# Patient Record
Sex: Female | Born: 1960 | Race: White | Hispanic: No | Marital: Married | Smoking: Former smoker
Health system: Southern US, Community
[De-identification: ages and names within clinical notes are randomized; demographics above are authoritative.]

## PROBLEM LIST (undated history)

## (undated) DIAGNOSIS — E119 Type 2 diabetes mellitus without complications: Secondary | ICD-10-CM

## (undated) DIAGNOSIS — I2510 Atherosclerotic heart disease of native coronary artery without angina pectoris: Secondary | ICD-10-CM

## (undated) DIAGNOSIS — F32A Depression, unspecified: Secondary | ICD-10-CM

## (undated) DIAGNOSIS — M5412 Radiculopathy, cervical region: Secondary | ICD-10-CM

## (undated) DIAGNOSIS — D649 Anemia, unspecified: Secondary | ICD-10-CM

## (undated) DIAGNOSIS — M51369 Other intervertebral disc degeneration, lumbar region without mention of lumbar back pain or lower extremity pain: Secondary | ICD-10-CM

## (undated) DIAGNOSIS — E041 Nontoxic single thyroid nodule: Secondary | ICD-10-CM

## (undated) DIAGNOSIS — I10 Essential (primary) hypertension: Secondary | ICD-10-CM

## (undated) DIAGNOSIS — F419 Anxiety disorder, unspecified: Secondary | ICD-10-CM

## (undated) DIAGNOSIS — J189 Pneumonia, unspecified organism: Secondary | ICD-10-CM

## (undated) DIAGNOSIS — Z79899 Other long term (current) drug therapy: Secondary | ICD-10-CM

## (undated) DIAGNOSIS — K219 Gastro-esophageal reflux disease without esophagitis: Secondary | ICD-10-CM

## (undated) DIAGNOSIS — M4802 Spinal stenosis, cervical region: Secondary | ICD-10-CM

## (undated) DIAGNOSIS — Z7902 Long term (current) use of antithrombotics/antiplatelets: Secondary | ICD-10-CM

## (undated) DIAGNOSIS — G43909 Migraine, unspecified, not intractable, without status migrainosus: Secondary | ICD-10-CM

## (undated) DIAGNOSIS — L409 Psoriasis, unspecified: Secondary | ICD-10-CM

## (undated) DIAGNOSIS — Z7960 Long term (current) use of unspecified immunomodulators and immunosuppressants: Secondary | ICD-10-CM

## (undated) DIAGNOSIS — I471 Supraventricular tachycardia, unspecified: Secondary | ICD-10-CM

## (undated) DIAGNOSIS — M1712 Unilateral primary osteoarthritis, left knee: Secondary | ICD-10-CM

## (undated) DIAGNOSIS — G608 Other hereditary and idiopathic neuropathies: Secondary | ICD-10-CM

## (undated) DIAGNOSIS — K224 Dyskinesia of esophagus: Secondary | ICD-10-CM

## (undated) DIAGNOSIS — G5601 Carpal tunnel syndrome, right upper limb: Secondary | ICD-10-CM

## (undated) DIAGNOSIS — J449 Chronic obstructive pulmonary disease, unspecified: Secondary | ICD-10-CM

## (undated) DIAGNOSIS — M1990 Unspecified osteoarthritis, unspecified site: Secondary | ICD-10-CM

## (undated) DIAGNOSIS — D509 Iron deficiency anemia, unspecified: Secondary | ICD-10-CM

## (undated) DIAGNOSIS — G8929 Other chronic pain: Secondary | ICD-10-CM

## (undated) DIAGNOSIS — L4050 Arthropathic psoriasis, unspecified: Secondary | ICD-10-CM

## (undated) DIAGNOSIS — R0600 Dyspnea, unspecified: Secondary | ICD-10-CM

## (undated) DIAGNOSIS — G4733 Obstructive sleep apnea (adult) (pediatric): Secondary | ICD-10-CM

## (undated) DIAGNOSIS — I779 Disorder of arteries and arterioles, unspecified: Secondary | ICD-10-CM

## (undated) DIAGNOSIS — E039 Hypothyroidism, unspecified: Secondary | ICD-10-CM

## (undated) DIAGNOSIS — E538 Deficiency of other specified B group vitamins: Secondary | ICD-10-CM

## (undated) DIAGNOSIS — I700 Atherosclerosis of aorta: Secondary | ICD-10-CM

## (undated) DIAGNOSIS — G4730 Sleep apnea, unspecified: Secondary | ICD-10-CM

## (undated) DIAGNOSIS — F329 Major depressive disorder, single episode, unspecified: Secondary | ICD-10-CM

## (undated) DIAGNOSIS — E7800 Pure hypercholesterolemia, unspecified: Secondary | ICD-10-CM

## (undated) HISTORY — PX: COLONOSCOPY WITH ESOPHAGOGASTRODUODENOSCOPY (EGD): SHX5779

## (undated) HISTORY — PX: TRIGGER FINGER RELEASE: SHX641

## (undated) HISTORY — PX: ABDOMINAL HYSTERECTOMY: SHX81

## (undated) HISTORY — PX: TONSILLECTOMY: SUR1361

## (undated) HISTORY — PX: OTHER SURGICAL HISTORY: SHX169

## (undated) HISTORY — PX: BUNIONECTOMY: SHX129

## (undated) HISTORY — PX: DILATION AND CURETTAGE OF UTERUS: SHX78

## (undated) HISTORY — PX: BACK SURGERY: SHX140

## (undated) HISTORY — PX: CYST EXCISION: SHX5701

---

## 2003-03-12 DIAGNOSIS — Z8614 Personal history of Methicillin resistant Staphylococcus aureus infection: Secondary | ICD-10-CM

## 2003-03-12 HISTORY — DX: Personal history of Methicillin resistant Staphylococcus aureus infection: Z86.14

## 2004-11-05 ENCOUNTER — Emergency Department: Payer: Self-pay | Admitting: Emergency Medicine

## 2004-11-23 ENCOUNTER — Emergency Department: Payer: Self-pay | Admitting: Emergency Medicine

## 2006-03-11 ENCOUNTER — Emergency Department: Payer: Self-pay

## 2007-01-30 ENCOUNTER — Ambulatory Visit: Payer: Self-pay | Admitting: Internal Medicine

## 2007-09-10 ENCOUNTER — Ambulatory Visit: Payer: Self-pay | Admitting: Anesthesiology

## 2007-10-15 ENCOUNTER — Ambulatory Visit: Payer: Self-pay | Admitting: Anesthesiology

## 2007-11-13 ENCOUNTER — Ambulatory Visit: Payer: Self-pay | Admitting: Anesthesiology

## 2007-12-15 ENCOUNTER — Ambulatory Visit: Payer: Self-pay | Admitting: Anesthesiology

## 2008-01-14 ENCOUNTER — Ambulatory Visit: Payer: Self-pay | Admitting: Anesthesiology

## 2008-03-15 ENCOUNTER — Ambulatory Visit: Payer: Self-pay | Admitting: Anesthesiology

## 2008-04-20 ENCOUNTER — Ambulatory Visit: Payer: Self-pay | Admitting: Anesthesiology

## 2008-05-06 ENCOUNTER — Ambulatory Visit: Payer: Self-pay | Admitting: Anesthesiology

## 2008-05-31 ENCOUNTER — Ambulatory Visit: Payer: Self-pay | Admitting: Anesthesiology

## 2008-07-11 ENCOUNTER — Ambulatory Visit: Payer: Self-pay | Admitting: Anesthesiology

## 2008-07-19 ENCOUNTER — Ambulatory Visit: Payer: Self-pay | Admitting: Anesthesiology

## 2008-08-25 ENCOUNTER — Ambulatory Visit: Payer: Self-pay | Admitting: Anesthesiology

## 2008-12-29 ENCOUNTER — Ambulatory Visit: Payer: Self-pay | Admitting: Anesthesiology

## 2009-04-06 ENCOUNTER — Ambulatory Visit: Payer: Self-pay | Admitting: Anesthesiology

## 2009-06-28 ENCOUNTER — Ambulatory Visit: Payer: Self-pay | Admitting: Anesthesiology

## 2009-08-31 ENCOUNTER — Ambulatory Visit: Payer: Self-pay | Admitting: Vascular Surgery

## 2009-08-31 ENCOUNTER — Inpatient Hospital Stay (HOSPITAL_COMMUNITY): Admission: RE | Admit: 2009-08-31 | Discharge: 2009-09-03 | Payer: Self-pay | Admitting: Neurosurgery

## 2010-05-27 LAB — BASIC METABOLIC PANEL
BUN: 10 mg/dL (ref 6–23)
Calcium: 10.1 mg/dL (ref 8.4–10.5)
Creatinine, Ser: 0.75 mg/dL (ref 0.4–1.2)
GFR calc non Af Amer: >60 mL/min (ref >60)
Glucose, Bld: 114 mg/dL — ABNORMAL HIGH (ref 70–99)

## 2010-05-27 LAB — CBC
MCHC: 35.7 g/dL (ref 30.0–36.0)
MCV: 96.4 fL (ref 78.0–100.0)
RBC: 4.36 MIL/uL (ref 3.87–5.11)
WBC: 7.8 10*3/uL (ref 4.0–10.5)

## 2010-05-27 LAB — TYPE AND SCREEN

## 2010-05-27 LAB — ABO/RH: ABO/RH(D): B POS

## 2010-05-27 LAB — SURGICAL PCR SCREEN: MRSA, PCR: NEGATIVE

## 2010-07-20 ENCOUNTER — Inpatient Hospital Stay: Payer: Self-pay | Admitting: Internal Medicine

## 2012-12-30 ENCOUNTER — Emergency Department: Payer: Self-pay | Admitting: Emergency Medicine

## 2013-04-05 ENCOUNTER — Emergency Department: Payer: Self-pay | Admitting: Emergency Medicine

## 2013-04-08 ENCOUNTER — Emergency Department: Payer: Self-pay | Admitting: Internal Medicine

## 2013-07-14 DIAGNOSIS — G43909 Migraine, unspecified, not intractable, without status migrainosus: Secondary | ICD-10-CM | POA: Insufficient documentation

## 2013-07-14 DIAGNOSIS — I10 Essential (primary) hypertension: Secondary | ICD-10-CM | POA: Insufficient documentation

## 2013-07-14 DIAGNOSIS — R1319 Other dysphagia: Secondary | ICD-10-CM | POA: Insufficient documentation

## 2013-07-14 DIAGNOSIS — D509 Iron deficiency anemia, unspecified: Secondary | ICD-10-CM | POA: Insufficient documentation

## 2013-08-16 ENCOUNTER — Ambulatory Visit: Payer: Self-pay | Admitting: Internal Medicine

## 2013-08-17 ENCOUNTER — Ambulatory Visit: Payer: Self-pay | Admitting: Internal Medicine

## 2013-08-23 ENCOUNTER — Ambulatory Visit: Payer: Self-pay | Admitting: Internal Medicine

## 2013-08-23 HISTORY — PX: BREAST BIOPSY: SHX20

## 2013-08-24 LAB — PATHOLOGY REPORT

## 2013-08-26 ENCOUNTER — Other Ambulatory Visit: Payer: Self-pay | Admitting: Neurosurgery

## 2013-08-26 ENCOUNTER — Other Ambulatory Visit (HOSPITAL_COMMUNITY): Payer: Self-pay | Admitting: Neurosurgery

## 2013-08-26 DIAGNOSIS — M5416 Radiculopathy, lumbar region: Secondary | ICD-10-CM

## 2013-09-17 ENCOUNTER — Ambulatory Visit: Payer: Self-pay | Admitting: Unknown Physician Specialty

## 2013-09-22 LAB — PATHOLOGY REPORT

## 2013-10-04 ENCOUNTER — Encounter (HOSPITAL_COMMUNITY): Payer: Self-pay | Admitting: Pharmacy Technician

## 2013-10-08 ENCOUNTER — Ambulatory Visit (HOSPITAL_COMMUNITY)
Admission: RE | Admit: 2013-10-08 | Discharge: 2013-10-08 | Disposition: A | Discharge status: Home / Self Care | Payer: Medicare Other | Source: Ambulatory Visit | Attending: Neurosurgery | Admitting: Neurosurgery

## 2013-10-08 ENCOUNTER — Other Ambulatory Visit (HOSPITAL_COMMUNITY): Payer: Self-pay | Admitting: Neurosurgery

## 2013-10-08 DIAGNOSIS — M5416 Radiculopathy, lumbar region: Secondary | ICD-10-CM

## 2013-10-08 DIAGNOSIS — IMO0002 Reserved for concepts with insufficient information to code with codable children: Secondary | ICD-10-CM | POA: Diagnosis not present

## 2013-10-08 MED ORDER — IOHEXOL 180 MG/ML  SOLN: 20.0000 mL | Freq: Once | INTRAMUSCULAR | Status: DC | PRN

## 2013-10-08 MED ORDER — DIAZEPAM 5 MG PO TABS
ORAL_TABLET | ORAL | Status: DC
Start: 2013-10-08 — End: 2013-10-09
  Filled 2013-10-08: qty 2

## 2013-10-08 MED ORDER — OXYCODONE-ACETAMINOPHEN 5-325 MG PO TABS
ORAL_TABLET | ORAL | Status: AC
  Filled 2013-10-08: qty 2

## 2013-10-08 MED ORDER — DIAZEPAM 5 MG PO TABS
10.0000 mg | ORAL_TABLET | Freq: Once | ORAL | Status: AC
  Administered 2013-10-08: 10 mg via ORAL

## 2013-10-08 MED ORDER — ONDANSETRON HCL 4 MG/2ML IJ SOLN: 4.0000 mg | Freq: Four times a day (QID) | INTRAMUSCULAR | Status: DC | PRN

## 2013-10-08 MED ORDER — IOHEXOL 300 MG/ML  SOLN
10.0000 mL | Freq: Once | INTRAMUSCULAR | Status: AC | PRN
  Administered 2013-10-08: 10 mL via INTRATHECAL

## 2013-10-08 MED ORDER — OXYCODONE-ACETAMINOPHEN 10-325 MG PO TABS: 1.0000 | ORAL_TABLET | ORAL | Status: DC | PRN

## 2013-10-08 MED ORDER — OXYCODONE-ACETAMINOPHEN 5-325 MG PO TABS
2.0000 | ORAL_TABLET | Freq: Once | ORAL | Status: AC
  Administered 2013-10-08: 2 via ORAL
  Filled 2013-10-08: qty 2

## 2013-10-08 NOTE — Discharge Instructions (Signed)
Myelography, Care After °These instructions give you information on caring for yourself after your procedure. Your doctor may also give you specific instructions. Call your doctor if you have any problems or questions after your procedure. °HOME CARE °· Rest often the first day. °· When you rest, lie flat, with your head slightly raised (elevated). °· Avoid heavy lifting and activity for 48 hours. °· You may take the bandage (dressing) off 1 day after the test. °GET HELP RIGHT AWAY IF:  °· You have a very bad headache. °· You have a fever. °MAKE SURE YOU: °· Understand these instructions. °· Will watch your condition. °· Will get help right away if you are not doing well or get worse. °Document Released: 12/05/2007 Document Revised: 07/12/2013 Document Reviewed: 06/16/2013 °[employer]® Patient Information ©2015 [employer]. This information is not intended to replace advice given to you by your health care provider. Make sure you discuss any questions you have with your health care provider. ° ° °

## 2013-10-08 NOTE — Procedures (Signed)
No note

## 2014-04-08 ENCOUNTER — Ambulatory Visit: Payer: Self-pay | Admitting: Internal Medicine

## 2014-08-17 DIAGNOSIS — R739 Hyperglycemia, unspecified: Secondary | ICD-10-CM | POA: Insufficient documentation

## 2014-08-22 IMAGING — CT CT CERVICAL SPINE W/ CM
5 of 7 series · 14 of 33 positions shown, 15 images · non-contrast
Comparison: Lumbar spine radiograph 08/18/2013 and lumbar spine MRI
06/08/2009. No prior cervical spine imaging available.

ADDENDUM:
No bridging bone is identified across the L4-5 disc space,
compatible with postoperative pseudoarthrosis. Pseudoarthrosis is
also questioned at L5-S1.
CLINICAL DATA: Low back pain with bilateral hip and leg pain.
Numbness extending to the feet. Bilateral arm numbness.
TECHNIQUE: Contiguous axial images were obtained through the Cervical and
Lumbar spine after the intrathecal infusion of contrast. Coronal and
sagittal reconstructions were obtained of the axial image sets.

[Series 202: orthogonal bone, idose (2) · axial · 0.35mm/px · z∈[+75,+119]mm · 2 of 70 slices shown]
[im 24/70  bone]
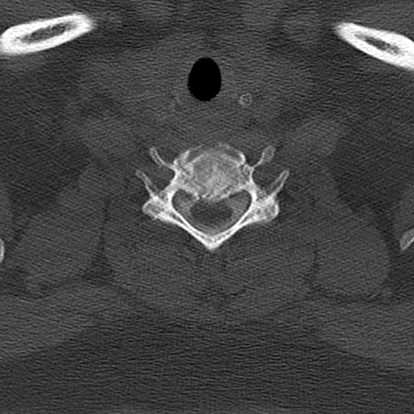
[im 47/70  bone]
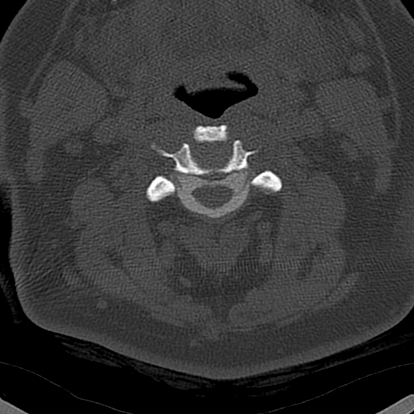

[Series 203: soft tissue, idose (2) · axial · 0.28mm/px · z∈[+86,+154]mm · 3 of 70 slices shown]
[im 18/70  soft-tissue]
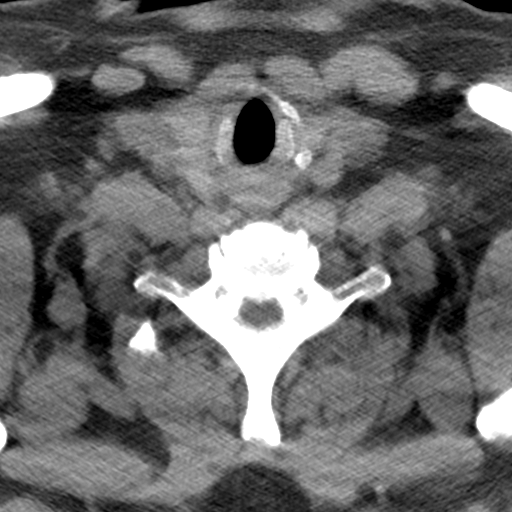
[im 35/70  soft-tissue]
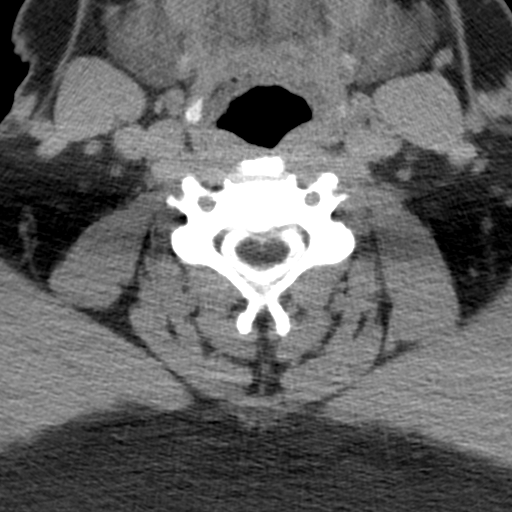
[im 52/70  soft-tissue]
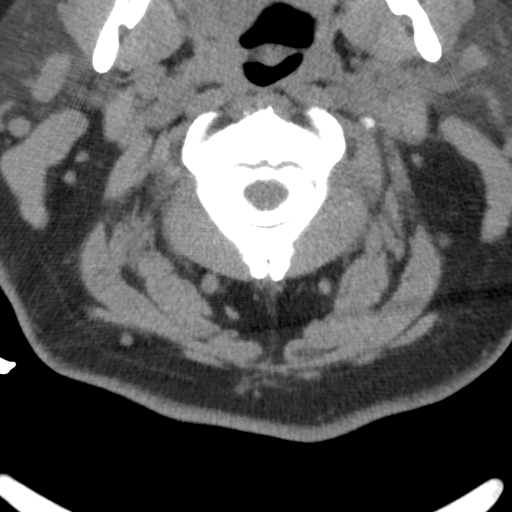

[Series 205: coronal bone, idose (2) · coronal · 0.35mm/px · 1 of 60 slices shown]
[im 30/60  bone]
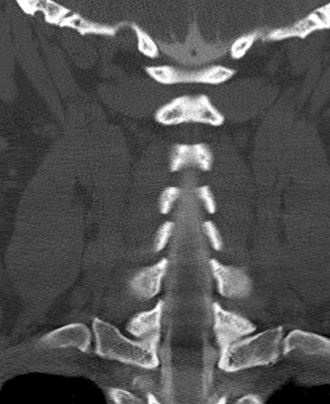

[Series 206: sagittal st, idose (2) · sagittal · 0.35mm/px · 5 of 57 slices shown]
[im 10/57  bone]
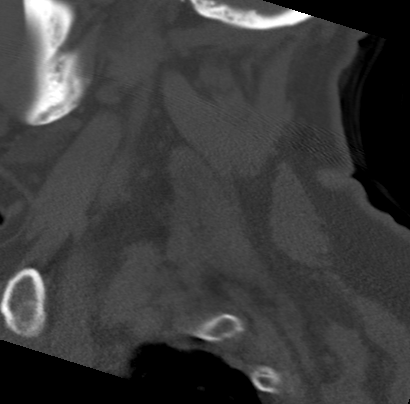
[im 19/57  bone]
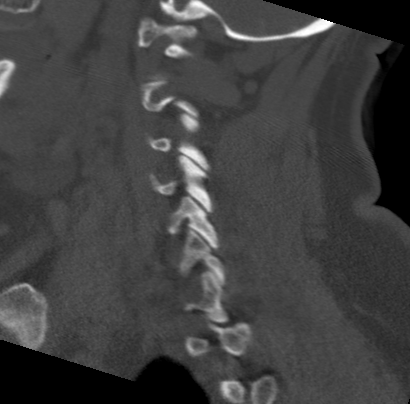
[im 29/57  bone]
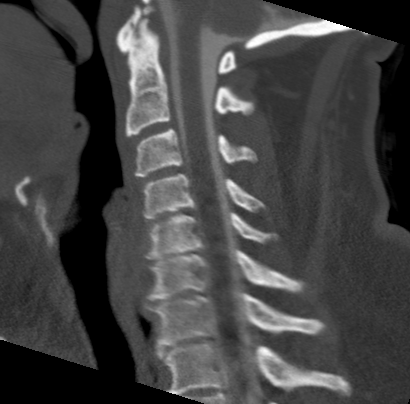
[im 38/57  bone]
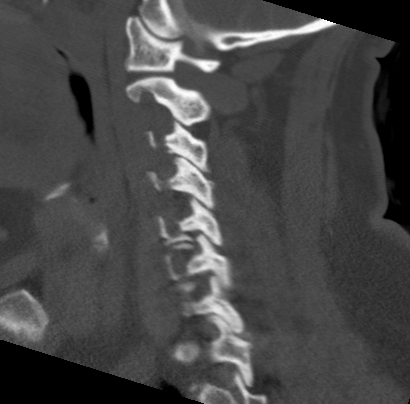
[im 47/57  bone]
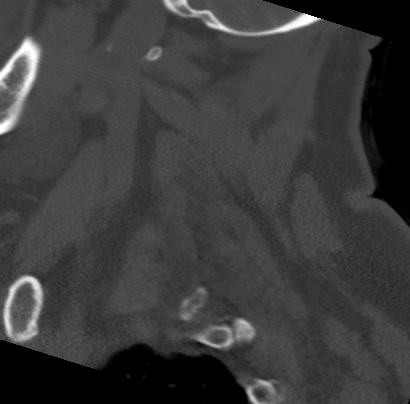

[Series 208: orthogonals, idose (2) · axial · 0.28mm/px · z∈[+86,+154]mm · 3 of 70 slices shown, 4 images]
[im 18/70  soft-tissue]
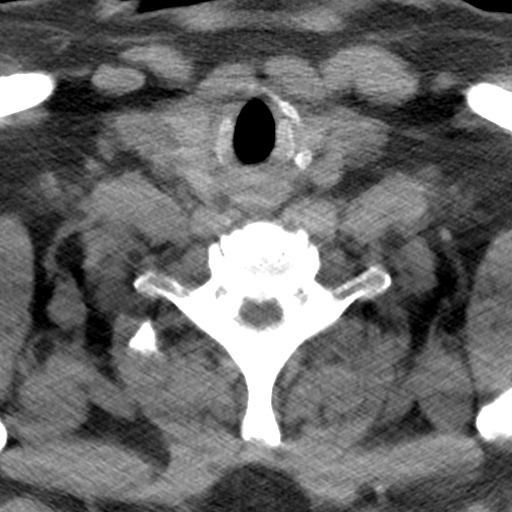
[im 18/70  bone]
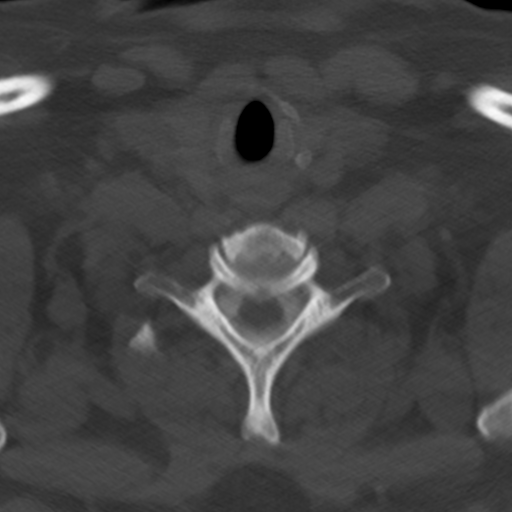
[im 35/70  bone]
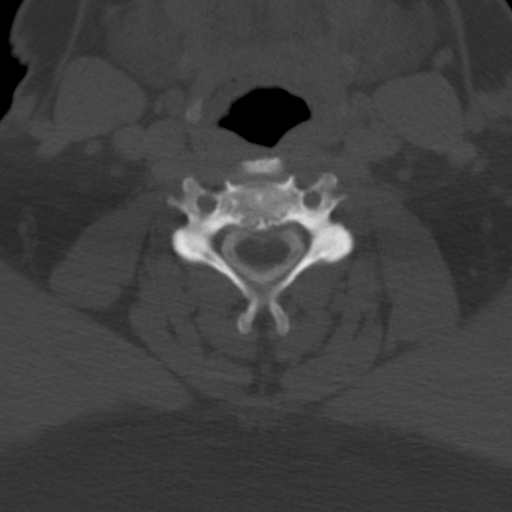
[im 52/70  bone]
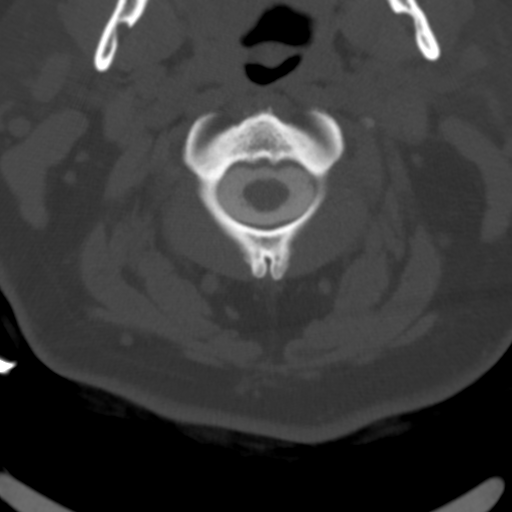

[14 of 33 positions shown; findings below may reference images not displayed]

FLUOROSCOPY TIME:  1 min 8 seconds

PROCEDURE:
CERVICAL AND LUMBAR MYELOGRAM

CT CERVICAL MYELOGRAM

CT LUMBAR MYELOGRAM

Lumbar puncture and intrathecal contrast administration were
performed by Dr. Remilyn who will separately report for the portion of
the procedure. I personally supervised acquisition of the myelogram
images.
FINDINGS: CERVICAL AND LUMBAR MYELOGRAM FINDINGS:

Sequelae of anterior lumbar interbody fusion are identified at L4-5
and L5-S1. A small ventral epidural defect is present at L3-4.
Lumbar vertebral alignment is normal. Cervical vertebral alignment
is normal. Multilevel anterior and posterior endplate spurring is
seen in the cervical spine, most prominently at C5-6.

CT CERVICAL MYELOGRAM FINDINGS:

Images are mildly degraded by motion. There is straightening of the
normal cervical lordosis. No significant listhesis is seen. There is
mild-to-moderate disc space narrowing from C4-5 to C7-T1, most
pronounced at C5-6. Degenerative endplate changes are present at
each of these levels. Cervical spinal cord is normal in caliber.
There is mild asymmetric enlargement of the right thyroid lobe with
question of a 1.4 cm nodule posteriorly, although evaluation is
limited by motion and image noise at this level.

C2-3:  Shallow central disc protrusion without stenosis.

C3-4: Small central disc osteophyte complex, uncovertebral
arthrosis, and mild left facet arthrosis result in mild left neural
foraminal stenosis. No spinal canal stenosis.

C4-5: Small central disc protrusion and uncovertebral arthrosis
without stenosis.

C5-6: Broad-based posterior disc osteophyte complex and prominent
uncovertebral arthrosis result in moderate to severe bilateral
neural foraminal stenosis and borderline spinal stenosis.

C6-7: Shallow, broad base posterior disc osteophyte complex and
uncovertebral arthrosis without stenosis.

C7-T1:  Mild uncovertebral spurring without stenosis.

CT LUMBAR MYELOGRAM FINDINGS:

Vertebral alignment is near anatomic, with at most trace
retrolisthesis of L2 on L3 and L3 on L4. Sequelae of ALIF are
identified L4-5 and L5-S1, new from prior MRI. No bridging bone is
identified traversing the L4-5 disc space. There may be minimal
bridging bone across the L5-S1 disc space. Vertebral body heights
are preserved without compression fracture. Mild disc space
narrowing and vacuum disc phenomenon are noted at L1-2 with
prominent anterior endplate osteophyte formation. The conus
medullaris terminates at T12-L1. Mild aortoiliac atherosclerotic
calcification is noted.

L1-2:  Minimal disc bulge without stenosis, unchanged.

L2-3:  Negative.

L3-4: Mild disc bulge and mild facet and ligamentum flavum
hypertrophy result in mild left greater than right neural foraminal
stenosis without spinal canal stenosis, unchanged.

L4-5: ALIF. There is no residual spinal canal stenosis. Mild disc
bulging and endplate spurring in the neural foramina and mild to
moderate facet arthrosis result in mild bilateral neural foraminal
stenosis, stable to slightly improved from prior.

L5-S1: ALIF. No residual disc protrusion or spinal stenosis. Mild
endplate spurring without neural foraminal stenosis.
IMPRESSION: 1. Moderate cervical spondylosis, greatest at C5-6 where there is
moderate to severe bilateral neural foraminal stenosis and
borderline spinal stenosis.
2. Prior ALIF at L4-5 and L5-S1 with widely patent spinal canal.
3. Mild bilateral neural foraminal stenosis at L3-4 and L4-5.

## 2014-08-22 IMAGING — CT CT L SPINE W/ CM
3 of 9 series · 15 of 33 positions shown, 17 images · non-contrast
Comparison: Lumbar spine radiograph 08/18/2013 and lumbar spine MRI
06/08/2009. No prior cervical spine imaging available.

ADDENDUM:
No bridging bone is identified across the L4-5 disc space,
compatible with postoperative pseudoarthrosis. Pseudoarthrosis is
also questioned at L5-S1.
CLINICAL DATA: Low back pain with bilateral hip and leg pain.
Numbness extending to the feet. Bilateral arm numbness.
TECHNIQUE: Contiguous axial images were obtained through the Cervical and
Lumbar spine after the intrathecal infusion of contrast. Coronal and
sagittal reconstructions were obtained of the axial image sets.

[Series 402: soft tissue, idose (2) · axial · 0.38mm/px · z∈[-352,-184]mm · 8 of 109 slices shown, 10 images]
[im 13/109  soft-tissue]
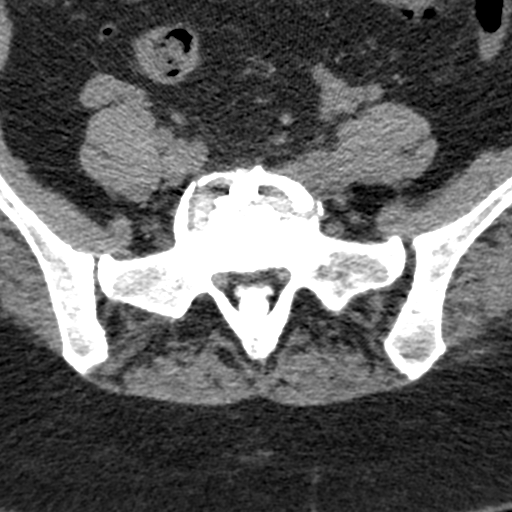
[im 13/109  bone]
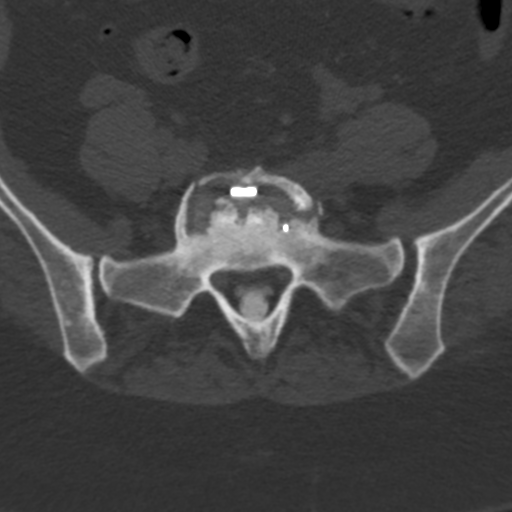
[im 25/109  bone]
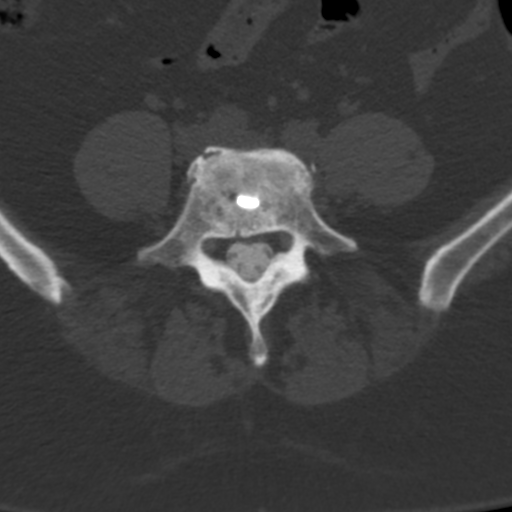
[im 37/109  bone]
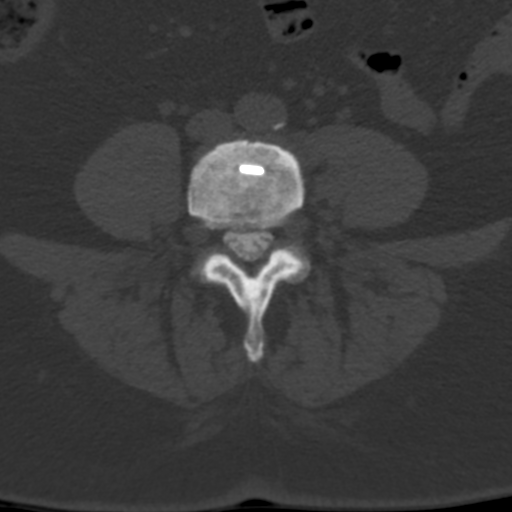
[im 49/109  bone]
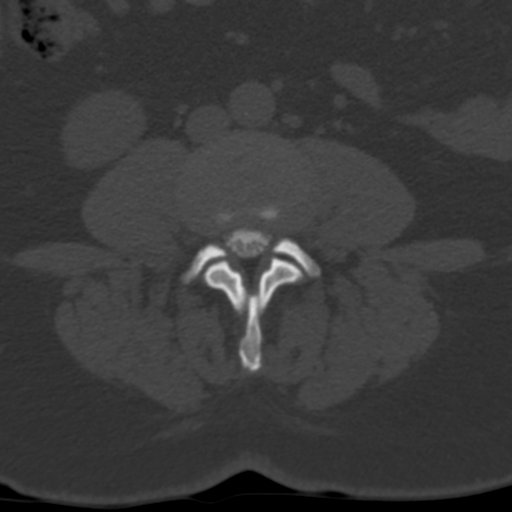
[im 61/109  soft-tissue]
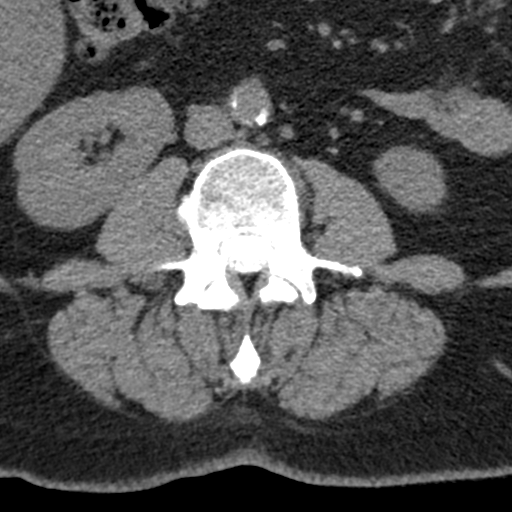
[im 61/109  bone]
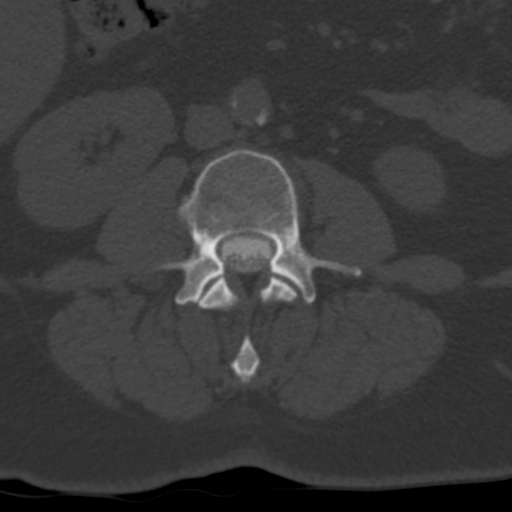
[im 73/109  bone]
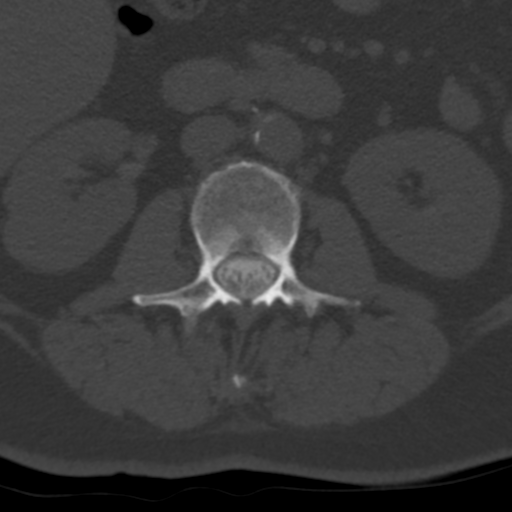
[im 85/109  bone]
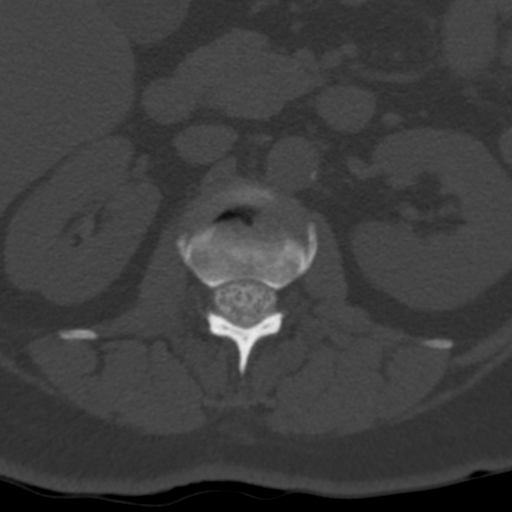
[im 97/109  bone]
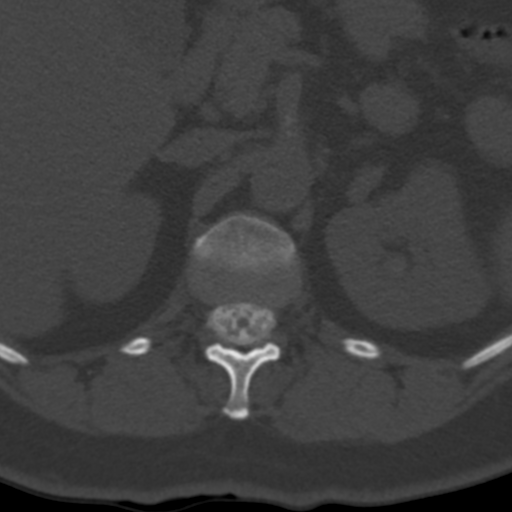

[Series 404: sagittal bone, idose (2) · sagittal · 0.36mm/px · 5 of 64 slices shown]
[im 11/64  bone]
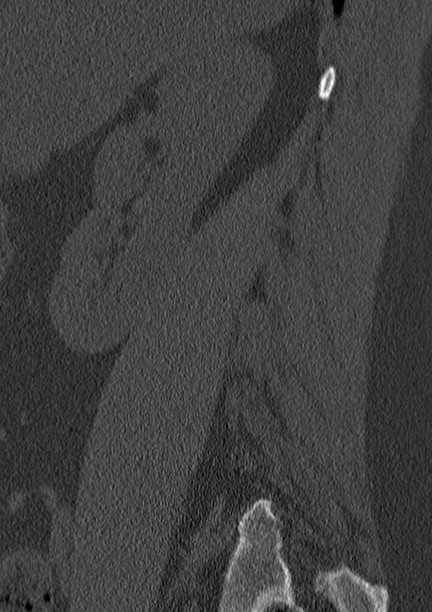
[im 22/64  bone]
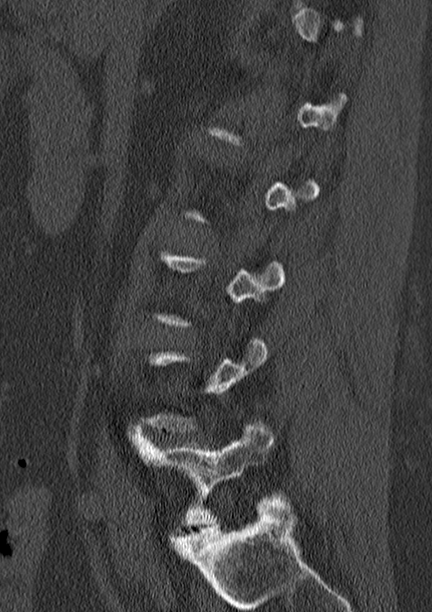
[im 32/64  bone]
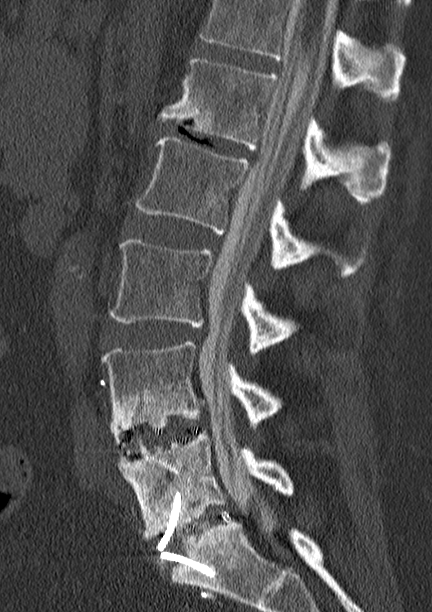
[im 43/64  bone]
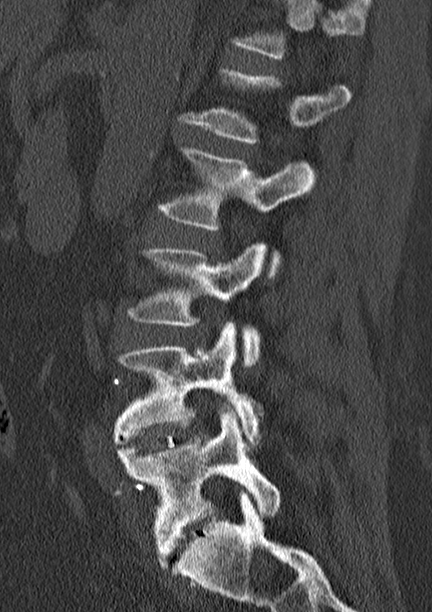
[im 53/64  bone]
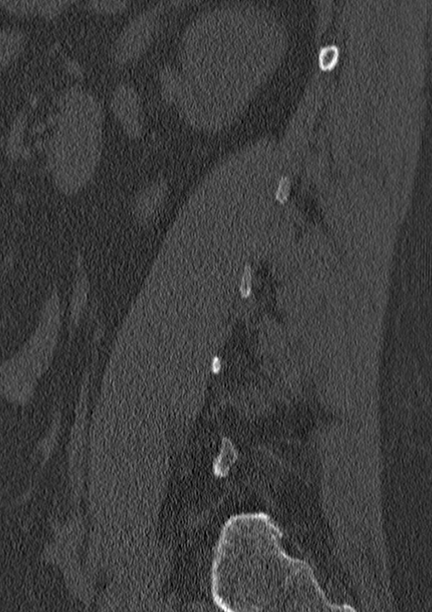

[Series 405: coronal st, idose (2) · coronal · 0.39mm/px · 2 of 79 slices shown]
[im 27/79  bone]
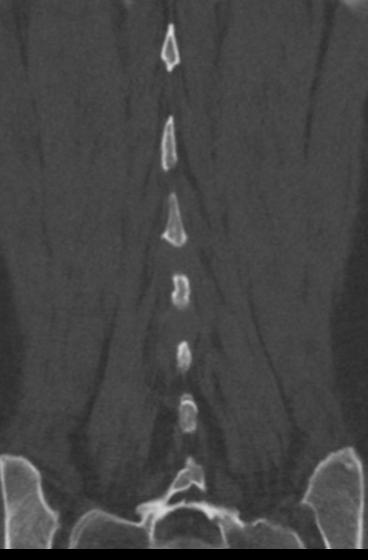
[im 53/79  bone]
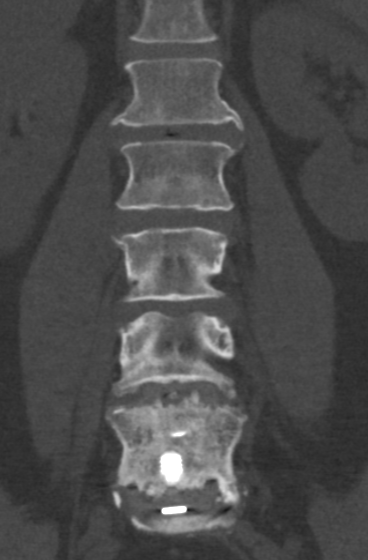

[15 of 33 positions shown; findings below may reference images not displayed]

FLUOROSCOPY TIME:  1 min 8 seconds

PROCEDURE:
CERVICAL AND LUMBAR MYELOGRAM

CT CERVICAL MYELOGRAM

CT LUMBAR MYELOGRAM

Lumbar puncture and intrathecal contrast administration were
performed by Dr. Remilyn who will separately report for the portion of
the procedure. I personally supervised acquisition of the myelogram
images.
FINDINGS: CERVICAL AND LUMBAR MYELOGRAM FINDINGS:

Sequelae of anterior lumbar interbody fusion are identified at L4-5
and L5-S1. A small ventral epidural defect is present at L3-4.
Lumbar vertebral alignment is normal. Cervical vertebral alignment
is normal. Multilevel anterior and posterior endplate spurring is
seen in the cervical spine, most prominently at C5-6.

CT CERVICAL MYELOGRAM FINDINGS:

Images are mildly degraded by motion. There is straightening of the
normal cervical lordosis. No significant listhesis is seen. There is
mild-to-moderate disc space narrowing from C4-5 to C7-T1, most
pronounced at C5-6. Degenerative endplate changes are present at
each of these levels. Cervical spinal cord is normal in caliber.
There is mild asymmetric enlargement of the right thyroid lobe with
question of a 1.4 cm nodule posteriorly, although evaluation is
limited by motion and image noise at this level.

C2-3:  Shallow central disc protrusion without stenosis.

C3-4: Small central disc osteophyte complex, uncovertebral
arthrosis, and mild left facet arthrosis result in mild left neural
foraminal stenosis. No spinal canal stenosis.

C4-5: Small central disc protrusion and uncovertebral arthrosis
without stenosis.

C5-6: Broad-based posterior disc osteophyte complex and prominent
uncovertebral arthrosis result in moderate to severe bilateral
neural foraminal stenosis and borderline spinal stenosis.

C6-7: Shallow, broad base posterior disc osteophyte complex and
uncovertebral arthrosis without stenosis.

C7-T1:  Mild uncovertebral spurring without stenosis.

CT LUMBAR MYELOGRAM FINDINGS:

Vertebral alignment is near anatomic, with at most trace
retrolisthesis of L2 on L3 and L3 on L4. Sequelae of ALIF are
identified L4-5 and L5-S1, new from prior MRI. No bridging bone is
identified traversing the L4-5 disc space. There may be minimal
bridging bone across the L5-S1 disc space. Vertebral body heights
are preserved without compression fracture. Mild disc space
narrowing and vacuum disc phenomenon are noted at L1-2 with
prominent anterior endplate osteophyte formation. The conus
medullaris terminates at T12-L1. Mild aortoiliac atherosclerotic
calcification is noted.

L1-2:  Minimal disc bulge without stenosis, unchanged.

L2-3:  Negative.

L3-4: Mild disc bulge and mild facet and ligamentum flavum
hypertrophy result in mild left greater than right neural foraminal
stenosis without spinal canal stenosis, unchanged.

L4-5: ALIF. There is no residual spinal canal stenosis. Mild disc
bulging and endplate spurring in the neural foramina and mild to
moderate facet arthrosis result in mild bilateral neural foraminal
stenosis, stable to slightly improved from prior.

L5-S1: ALIF. No residual disc protrusion or spinal stenosis. Mild
endplate spurring without neural foraminal stenosis.
IMPRESSION: 1. Moderate cervical spondylosis, greatest at C5-6 where there is
moderate to severe bilateral neural foraminal stenosis and
borderline spinal stenosis.
2. Prior ALIF at L4-5 and L5-S1 with widely patent spinal canal.
3. Mild bilateral neural foraminal stenosis at L3-4 and L4-5.

## 2014-08-24 ENCOUNTER — Other Ambulatory Visit: Payer: Self-pay | Admitting: Internal Medicine

## 2014-08-24 DIAGNOSIS — R928 Other abnormal and inconclusive findings on diagnostic imaging of breast: Secondary | ICD-10-CM

## 2014-08-31 ENCOUNTER — Ambulatory Visit: Payer: Medicare Other

## 2014-08-31 ENCOUNTER — Ambulatory Visit
Admission: RE | Admit: 2014-08-31 | Discharge: 2014-08-31 | Disposition: A | Discharge status: Home / Self Care | Payer: Medicare Other | Source: Ambulatory Visit | Attending: Internal Medicine | Admitting: Internal Medicine

## 2014-08-31 DIAGNOSIS — R928 Other abnormal and inconclusive findings on diagnostic imaging of breast: Secondary | ICD-10-CM | POA: Diagnosis present

## 2014-11-22 ENCOUNTER — Other Ambulatory Visit: Payer: Self-pay | Admitting: Internal Medicine

## 2014-11-22 DIAGNOSIS — R1310 Dysphagia, unspecified: Secondary | ICD-10-CM

## 2014-12-02 ENCOUNTER — Ambulatory Visit
Admission: RE | Admit: 2014-12-02 | Discharge: 2014-12-02 | Disposition: A | Discharge status: Home / Self Care | Payer: Medicare Other | Source: Ambulatory Visit | Attending: Internal Medicine | Admitting: Internal Medicine

## 2014-12-02 DIAGNOSIS — R1310 Dysphagia, unspecified: Secondary | ICD-10-CM | POA: Diagnosis present

## 2014-12-02 DIAGNOSIS — K219 Gastro-esophageal reflux disease without esophagitis: Secondary | ICD-10-CM | POA: Insufficient documentation

## 2015-09-11 ENCOUNTER — Emergency Department
Admission: EM | Admit: 2015-09-11 | Discharge: 2015-09-11 | Disposition: A | Discharge status: Home / Self Care | Payer: Medicare Other | Attending: Emergency Medicine | Admitting: Emergency Medicine

## 2015-09-11 ENCOUNTER — Encounter: Payer: Self-pay | Admitting: Emergency Medicine

## 2015-09-11 DIAGNOSIS — F17200 Nicotine dependence, unspecified, uncomplicated: Secondary | ICD-10-CM | POA: Insufficient documentation

## 2015-09-11 DIAGNOSIS — F329 Major depressive disorder, single episode, unspecified: Secondary | ICD-10-CM | POA: Insufficient documentation

## 2015-09-11 DIAGNOSIS — Y929 Unspecified place or not applicable: Secondary | ICD-10-CM | POA: Diagnosis not present

## 2015-09-11 DIAGNOSIS — Y999 Unspecified external cause status: Secondary | ICD-10-CM | POA: Insufficient documentation

## 2015-09-11 DIAGNOSIS — W268XXA Contact with other sharp object(s), not elsewhere classified, initial encounter: Secondary | ICD-10-CM | POA: Diagnosis not present

## 2015-09-11 DIAGNOSIS — Y9389 Activity, other specified: Secondary | ICD-10-CM | POA: Insufficient documentation

## 2015-09-11 DIAGNOSIS — S61011A Laceration without foreign body of right thumb without damage to nail, initial encounter: Secondary | ICD-10-CM | POA: Diagnosis present

## 2015-09-11 DIAGNOSIS — I10 Essential (primary) hypertension: Secondary | ICD-10-CM | POA: Diagnosis not present

## 2015-09-11 HISTORY — DX: Depression, unspecified: F32.A

## 2015-09-11 HISTORY — DX: Pure hypercholesterolemia, unspecified: E78.00

## 2015-09-11 HISTORY — DX: Major depressive disorder, single episode, unspecified: F32.9

## 2015-09-11 HISTORY — DX: Essential (primary) hypertension: I10

## 2015-09-11 MED ORDER — LIDOCAINE HCL (PF) 1 % IJ SOLN
5.0000 mL | Freq: Once | INTRAMUSCULAR | Status: AC
  Administered 2015-09-11: 5 mL
  Filled 2015-09-11: qty 5

## 2015-09-11 NOTE — ED Provider Notes (Signed)
[facility] Emergency Department Provider Note  ____________________________________________  Time seen: Approximately 10:15 AM  I have reviewed the triage vital signs and the nursing notes.   HISTORY  Chief Complaint Laceration    HPI Taylor [name] is a 55 y.o. female, NAD, presents to the emergency department with 1 day history of laceration to the right thumb. States she was using a mandolin to cut corn yesterday around 2pm when she cut her thumb. Has not been able to control the bleeding. Has wrapped the thumb in sanitary pads and duct tape.  Pain is described as sharp and throbbing and rated at 9/10. Has taken Tylenol without relief. States that she went through eight wash cloths when the trauma occurred but the cloths were not saturated. Denies numbness nor paraesthesias. Has had no decreased range of motion of the thumb or other fingers. Tetanus vaccine is up-to-date within the last 5 years.   Past Medical History  Diagnosis Date  . High cholesterol   . Hypertension   . Depression     There are no active problems to display for this patient.   Past Surgical History  Procedure Laterality Date  . Breast biopsy Left 08-23-2013    stereo, negative    Current Outpatient Rx  Name  Route  Sig  Dispense  Refill  . oxyCODONE-acetaminophen (PERCOCET) 10-325 MG per tablet   Oral   Take 1 tablet by mouth every 4 (four) hours as needed for pain.   30 tablet   0   . pravastatin (PRAVACHOL) 40 MG tablet   Oral   Take 40 mg by mouth at bedtime.         . traMADol (ULTRAM) 50 MG tablet   Oral   Take 50 mg by mouth every 6 (six) hours as needed for moderate pain.           Allergies Aspirin and Penicillins  Family History  Problem Relation Age of Onset  . Breast cancer Mother 52    Social History Social History  Substance Use Topics  . Smoking status: Current Every Day Smoker  . Smokeless tobacco: None  . Alcohol Use: Yes     Review  of Systems  Constitutional: No fever/chills, fatigue Cardiovascular: No chest pain, palpitations. Respiratory: No shortness of breath. No wheezing.  Gastrointestinal: No abdominal pain.  No nausea, vomiting.   Musculoskeletal: Positive right thumb pain at laceration site. Negative for right hand, wrist pain.  Skin: Positive for laceration to right thumb pad. Negative for rash, redness, swelling.  Neurological: Negative for headaches, focal weakness. No paraesthesias nor numbness. 10-point ROS otherwise negative.  ____________________________________________   PHYSICAL EXAM:  VITAL SIGNS: ED Triage Vitals  Enc Vitals Group     BP --      Pulse --      Resp --      Temp --      Temp src --      SpO2 --      Weight --      Height --      Head Cir --      Peak Flow --      Pain Score 09/11/15 1003 9     Pain Loc --      Pain Edu? --      Excl. in [place]? --      Constitutional: Alert and oriented. Well appearing and in no acute distress. Eyes: Conjunctivae are normal.  Head: Atraumatic. Cardiovascular:  Good  peripheral circulation with 2+ pulses noted in the right upper extremity. Capillary refill is brisk in all digits of the right hand Respiratory: Normal respiratory effort without tachypnea or retractions. Musculoskeletal: Full ROM and 5/5 strength in R thumb. No tenderness to palpation about the joints of the right thumb. Neurologic:  Normal speech and language. No gross focal neurologic deficits are appreciated. Sensation to light touch grossly intact about all digits of the right hand Skin:  Skin is warm and dry. No rash, redness, swelling, bruising noted. 0.7cm annular soft tissue defect laceration to the right thumb pad, weeping and clotting blood. Psychiatric: Mood and affect are normal. Speech and behavior are normal. Patient exhibits appropriate insight and judgement.   ____________________________________________    LABS  None ____________________________________________  EKG  None ____________________________________________  RADIOLOGY  None ____________________________________________    PROCEDURES  Procedure(s) performed: LACERATION REPAIR Performed by: Braxton Feathers Authorized by: Braxton Feathers Consent: Verbal consent obtained. Risks and benefits: risks, benefits and alternatives were discussed Consent given by: patient Patient identity confirmed: provided demographic data Wound explored  Laceration Location: Distal right thumb  Laceration Length: 0.7cm annular soft tissue defect  No Foreign Bodies seen or palpated  Anesthesia: Digital block  Local anesthetic: lidocaine 1% w/o epinephrine  Anesthetic total: 3 ml  Irrigation method: syringe Amount of cleaning: standard  Technique: The wound was thoroughly irrigated and soaked in a saline and iodine mixture. Surgicel was applied to the open wound to gain appropriate hemostasis. Wound was then covered with nonstick bandage and gauze.  Patient tolerance: Patient tolerated the procedure well with no immediate complications.      Medications  lidocaine (PF) (XYLOCAINE) 1 % injection 5 mL (5 mLs Infiltration Given 09/11/15 1130)     ____________________________________________   INITIAL IMPRESSION / ASSESSMENT AND PLAN / ED COURSE  Patient's diagnosis is consistent with Laceration of right thumb without foreign body without damage to nail. Patient will be discharged home with instructions for home care to include daily bandage changes. Patient may take over-the-counter Tylenol or ibuprofen as needed for pain. Patient is to follow up with her primary care provider or [facility] in 48 hours for wound recheck. Patient is given ED precautions to return to the ED for any worsening or new symptoms.    ____________________________________________  FINAL CLINICAL IMPRESSION(S) / ED DIAGNOSES  Final diagnoses:   Laceration of right thumb without foreign body without damage to nail, initial encounter      NEW MEDICATIONS STARTED DURING THIS VISIT:  Discharge Medication List as of 09/11/2015 11:21 AM           Braxton Feathers, PA-C 09/11/15 [place], MD 09/13/15 1517

## 2015-09-11 NOTE — Discharge Instructions (Signed)
Wound Care Taking care of your wound properly can help to prevent pain and infection. It can also help your wound to heal more quickly.  HOW TO CARE FOR YOUR WOUND  Take or apply over-the-counter and prescription medicines only as told by your health care provider.  If you were prescribed antibiotic medicine, take or apply it as told by your health care provider. Do not stop using the antibiotic even if your condition improves.  Clean the wound each day or as told by your health care provider.  Wash the wound with mild soap and water.  Rinse the wound with water to remove all soap.  Pat the wound dry with a clean towel. Do not rub it.  There are many different ways to close and cover a wound. For example, a wound can be covered with stitches (sutures), skin glue, or adhesive strips. Follow instructions from your health care provider about:  How to take care of your wound.  When and how you should change your bandage (dressing).  When you should remove your dressing.  Removing whatever was used to close your wound.  Check your wound every day for signs of infection. Watch for:  Redness, swelling, or pain.  Fluid, blood, or pus.  Keep the dressing dry until your health care provider says it can be removed. Do not take baths, swim, use a hot tub, or do anything that would put your wound underwater until your health care provider approves.  Raise (elevate) the injured area above the level of your heart while you are sitting or lying down.  Do not scratch or pick at the wound.  Keep all follow-up visits as told by your health care provider. This is important. SEEK MEDICAL CARE IF:  You received a tetanus shot and you have swelling, severe pain, redness, or bleeding at the injection site.  You have a fever.  Your pain is not controlled with medicine.  You have increased redness, swelling, or pain at the site of your wound.  You have fluid, blood, or pus coming from your  wound.  You notice a bad smell coming from your wound or your dressing. SEEK IMMEDIATE MEDICAL CARE IF:  You have a red streak going away from your wound.   This information is not intended to replace advice given to you by your health care provider. Make sure you discuss any questions you have with your health care provider.   Document Released: 12/05/2007 Document Revised: 07/12/2014 Document Reviewed: 02/21/2014 Elsevier Interactive Patient Education 2016 [employer]  Nonsutured Laceration Care A laceration is a cut that goes through all layers of the skin and extends into the tissue that is right under the skin. This type of cut is usually stitched up (sutured) or closed with tape (adhesive strips) or skin glue shortly after the injury happens. However, if the wound is dirty or if several hours pass before medical treatment is provided, it is likely that germs (bacteria) will enter the wound. Closing a laceration after bacteria have entered it increases the risk of infection. In these cases, your health care provider may leave the laceration open (nonsutured) and cover it with a bandage. This type of treatment helps prevent infection and allows the wound to heal from the deepest layer of tissue damage up to the surface. An open fracture is a type of injury that may involve nonsutured lacerations. An open fracture is a break in a bone that happens along with one or more lacerations through  the skin that is near the fracture site. HOW TO CARE FOR YOUR NONSUTURED LACERATION  Take or apply over-the-counter and prescription medicines only as told by your health care provider.  If you were prescribed an antibiotic medicine, take or apply it as told by your health care provider. Do not stop using the antibiotic even if your condition improves.  Clean the wound one time each day or as told by your health care provider.  Wash the wound with mild soap and water.  Rinse the wound with water to  remove all soap.  Pat your wound dry with a clean towel. Do not rub the wound.  Do not inject anything into the wound unless your health care provider told you to.  Change any bandages (dressings) as told by your health care provider. This includes changing the dressing if it gets wet, dirty, or starts to smell bad.  Keep the dressing dry until your health care provider says it can be removed. Do not take baths, swim, or do anything that puts your wound underwater until your health care provider approves.  Raise (elevate) the injured area above the level of your heart while you are sitting or lying down, if possible.  Do not scratch or pick at the wound.  Check your wound every day for signs of infection. Watch for:  Redness, swelling, or pain.  Fluid, blood, or pus.  Keep all follow-up visits as told by your health care provider. This is important. SEEK MEDICAL CARE IF:  You received a tetanus and shot and you have swelling, severe pain, redness, or bleeding at the injection site.   You have a fever.  Your pain is not controlled with medicine.  You have increased redness, swelling, or pain at the site of your wound.  You have fluid, blood, or pus coming from your wound.  You notice a bad smell coming from your wound or your dressing.  You notice something coming out of the wound, such as wood or glass.  You notice a change in the color of your skin near your wound.  You develop a new rash.  You need to change the dressing frequently due to fluid, blood, or pus draining from the wound.  You develop numbness around your wound. SEEK IMMEDIATE MEDICAL CARE IF:  Your pain suddenly increases and is severe.  You develop severe swelling around the wound.  The wound is on your hand or foot and you cannot properly move a finger or toe.  The wound is on your hand or foot and you notice that your fingers or toes look pale or bluish.  You have a red streak going away from  your wound.   This information is not intended to replace advice given to you by your health care provider. Make sure you discuss any questions you have with your health care provider.   Document Released: 01/23/2006 Document Revised: 07/12/2014 Document Reviewed: 02/21/2014 Elsevier Interactive Patient Education [employer].

## 2015-09-11 NOTE — ED Notes (Signed)
See triage note   States she lacerated her right thumb yesterday  States she cleaned it and placed a dressing  But area conts to bleed

## 2015-09-11 NOTE — ED Notes (Signed)
Pt to ed with c/o laceration to right hand first digit.  States she cut it on a mandolin yesterday while cutting corn off the cob.  Pt states she can not get it to stop bleeding.

## 2016-01-01 DIAGNOSIS — L400 Psoriasis vulgaris: Secondary | ICD-10-CM | POA: Insufficient documentation

## 2016-01-01 DIAGNOSIS — L4050 Arthropathic psoriasis, unspecified: Secondary | ICD-10-CM | POA: Insufficient documentation

## 2016-02-08 DIAGNOSIS — Z79899 Other long term (current) drug therapy: Secondary | ICD-10-CM | POA: Insufficient documentation

## 2016-07-04 ENCOUNTER — Other Ambulatory Visit: Payer: Self-pay | Admitting: Internal Medicine

## 2016-07-04 DIAGNOSIS — Z1231 Encounter for screening mammogram for malignant neoplasm of breast: Secondary | ICD-10-CM

## 2016-07-30 ENCOUNTER — Ambulatory Visit
Admission: RE | Admit: 2016-07-30 | Discharge: 2016-07-30 | Disposition: A | Discharge status: Home / Self Care | Payer: Medicare Other | Source: Ambulatory Visit | Attending: Internal Medicine | Admitting: Internal Medicine

## 2016-07-30 DIAGNOSIS — Z1231 Encounter for screening mammogram for malignant neoplasm of breast: Secondary | ICD-10-CM | POA: Diagnosis not present

## 2017-01-07 DIAGNOSIS — M5136 Other intervertebral disc degeneration, lumbar region: Secondary | ICD-10-CM | POA: Insufficient documentation

## 2017-01-13 DIAGNOSIS — I471 Supraventricular tachycardia: Secondary | ICD-10-CM | POA: Insufficient documentation

## 2017-05-13 DIAGNOSIS — M25572 Pain in left ankle and joints of left foot: Secondary | ICD-10-CM | POA: Insufficient documentation

## 2017-05-13 DIAGNOSIS — G8929 Other chronic pain: Secondary | ICD-10-CM | POA: Insufficient documentation

## 2017-07-18 ENCOUNTER — Other Ambulatory Visit: Payer: Self-pay | Admitting: Internal Medicine

## 2017-07-18 DIAGNOSIS — Z1231 Encounter for screening mammogram for malignant neoplasm of breast: Secondary | ICD-10-CM

## 2017-08-08 ENCOUNTER — Ambulatory Visit
Admission: RE | Admit: 2017-08-08 | Discharge: 2017-08-08 | Disposition: A | Discharge status: Home / Self Care | Payer: Medicare Other | Source: Ambulatory Visit | Attending: Internal Medicine | Admitting: Internal Medicine

## 2017-08-08 DIAGNOSIS — Z1231 Encounter for screening mammogram for malignant neoplasm of breast: Secondary | ICD-10-CM | POA: Insufficient documentation

## 2017-08-13 ENCOUNTER — Other Ambulatory Visit: Payer: Self-pay | Admitting: Internal Medicine

## 2017-08-13 DIAGNOSIS — M25561 Pain in right knee: Secondary | ICD-10-CM | POA: Insufficient documentation

## 2017-08-13 DIAGNOSIS — N644 Mastodynia: Secondary | ICD-10-CM

## 2017-08-22 ENCOUNTER — Ambulatory Visit
Admission: RE | Admit: 2017-08-22 | Discharge: 2017-08-22 | Disposition: A | Discharge status: Home / Self Care | Payer: Medicare Other | Source: Ambulatory Visit | Attending: Internal Medicine | Admitting: Internal Medicine

## 2017-08-22 DIAGNOSIS — N644 Mastodynia: Secondary | ICD-10-CM | POA: Diagnosis not present

## 2017-12-11 DIAGNOSIS — R252 Cramp and spasm: Secondary | ICD-10-CM | POA: Insufficient documentation

## 2017-12-11 DIAGNOSIS — M1711 Unilateral primary osteoarthritis, right knee: Secondary | ICD-10-CM | POA: Insufficient documentation

## 2018-04-07 DIAGNOSIS — E538 Deficiency of other specified B group vitamins: Secondary | ICD-10-CM | POA: Insufficient documentation

## 2018-06-29 ENCOUNTER — Encounter: Admission: RE | Payer: Self-pay | Source: Home / Self Care

## 2018-06-29 ENCOUNTER — Ambulatory Visit
Admission: RE | Admit: 2018-06-29 | Payer: Medicare Other | Source: Home / Self Care | Admitting: Unknown Physician Specialty

## 2018-06-29 SURGERY — ESOPHAGOGASTRODUODENOSCOPY (EGD) WITH PROPOFOL
Anesthesia: General

## 2018-07-23 ENCOUNTER — Other Ambulatory Visit: Payer: Self-pay | Admitting: Internal Medicine

## 2018-07-23 DIAGNOSIS — Z1231 Encounter for screening mammogram for malignant neoplasm of breast: Secondary | ICD-10-CM

## 2018-07-24 ENCOUNTER — Other Ambulatory Visit (HOSPITAL_COMMUNITY): Payer: Self-pay | Admitting: Internal Medicine

## 2018-07-24 ENCOUNTER — Other Ambulatory Visit: Payer: Self-pay | Admitting: Internal Medicine

## 2018-07-24 DIAGNOSIS — R1012 Left upper quadrant pain: Secondary | ICD-10-CM

## 2018-07-31 ENCOUNTER — Other Ambulatory Visit: Payer: Self-pay

## 2018-07-31 ENCOUNTER — Ambulatory Visit
Admission: RE | Admit: 2018-07-31 | Discharge: 2018-07-31 | Disposition: A | Discharge status: Home / Self Care | Payer: Medicare Other | Source: Ambulatory Visit | Attending: Internal Medicine | Admitting: Internal Medicine

## 2018-07-31 DIAGNOSIS — R1012 Left upper quadrant pain: Secondary | ICD-10-CM | POA: Diagnosis not present

## 2018-08-26 ENCOUNTER — Ambulatory Visit
Admission: RE | Admit: 2018-08-26 | Discharge: 2018-08-26 | Disposition: A | Discharge status: Home / Self Care | Payer: Medicare Other | Source: Ambulatory Visit | Attending: Internal Medicine | Admitting: Internal Medicine

## 2018-08-26 ENCOUNTER — Other Ambulatory Visit: Payer: Self-pay

## 2018-08-26 DIAGNOSIS — Z1231 Encounter for screening mammogram for malignant neoplasm of breast: Secondary | ICD-10-CM | POA: Diagnosis present

## 2018-08-31 DIAGNOSIS — K2950 Unspecified chronic gastritis without bleeding: Secondary | ICD-10-CM | POA: Insufficient documentation

## 2019-01-26 DIAGNOSIS — E118 Type 2 diabetes mellitus with unspecified complications: Secondary | ICD-10-CM | POA: Insufficient documentation

## 2019-06-10 DEATH — deceased

## 2019-06-14 IMAGING — US ULTRASOUND ABDOMEN COMPLETE
1 series · 13 of 25 positions shown · non-contrast
Comparison: None.

CLINICAL DATA: Left upper quadrant abdominal pain for 6 months.

EXAM:
ABDOMEN ULTRASOUND COMPLETE

[Series 1: ultrasound abdomen complete · 0.26mm/px · 13 of 140 slices shown]
[im 1/140]
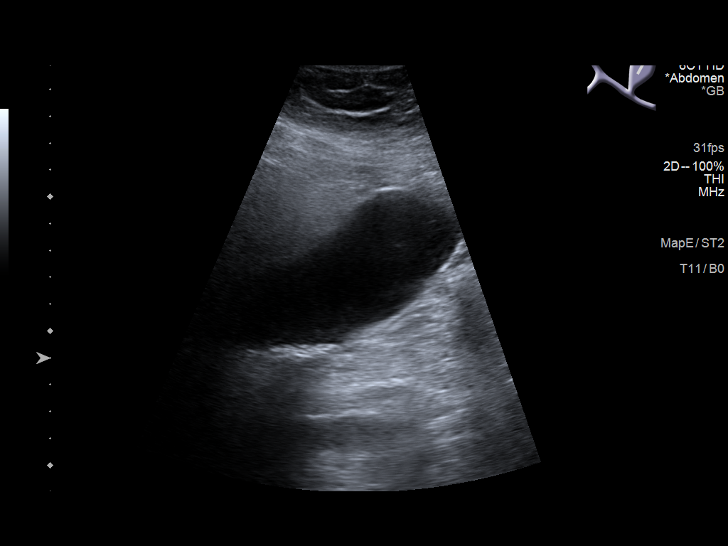
[im 12/140]
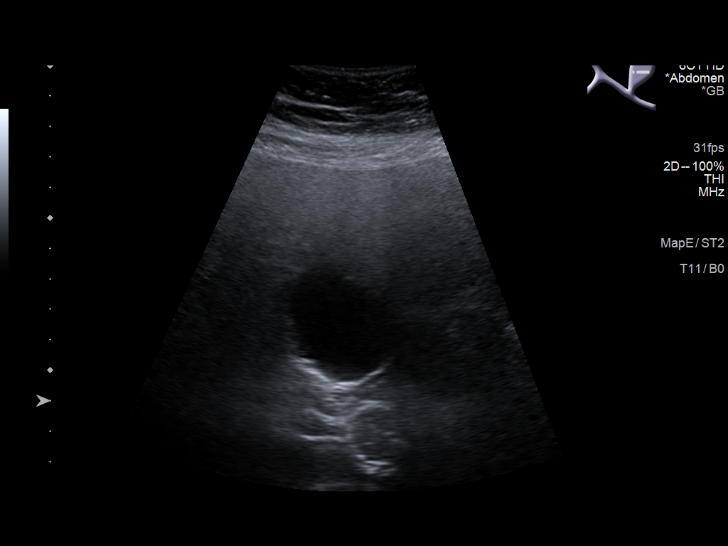
[im 24/140]
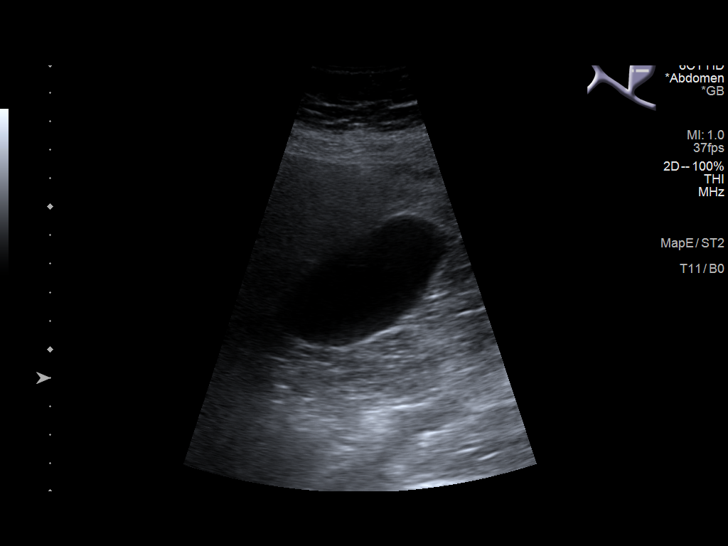
[im 35/140]
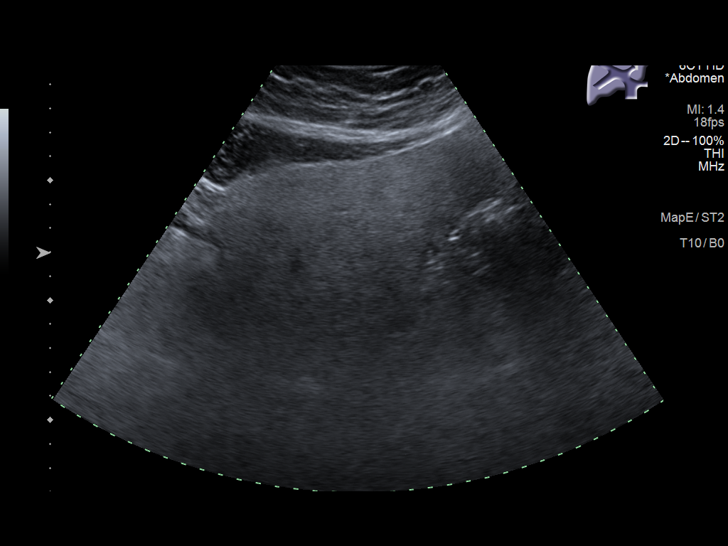
[im 47/140]
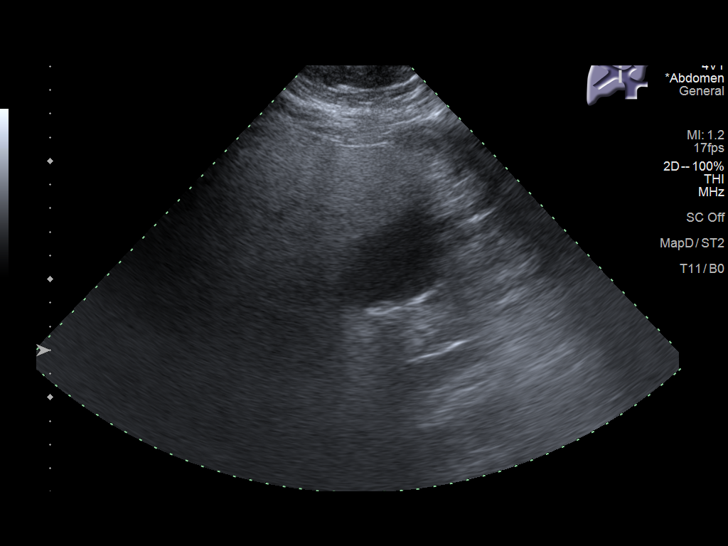
[im 58/140]
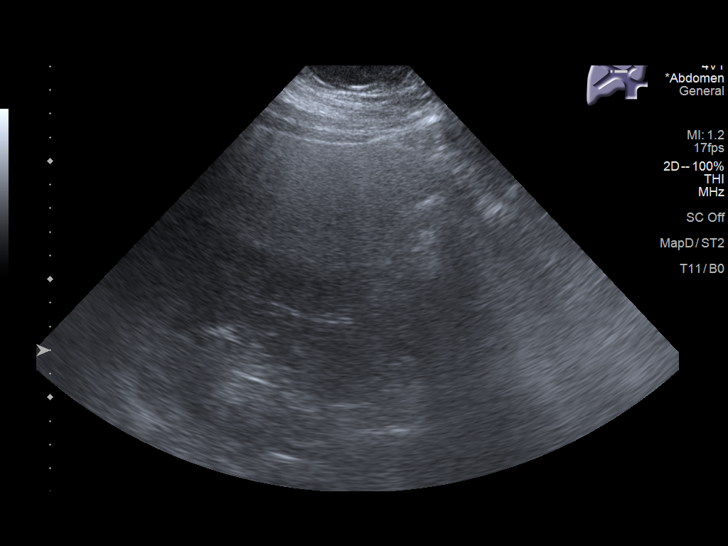
[im 70/140]
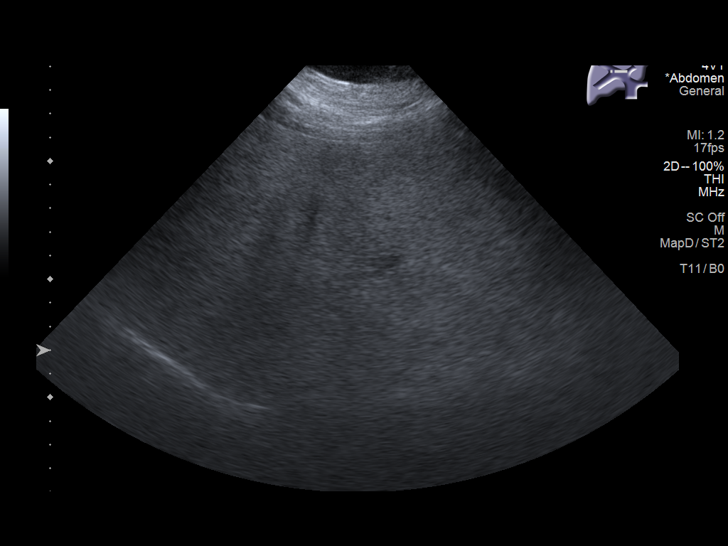
[im 82/140]
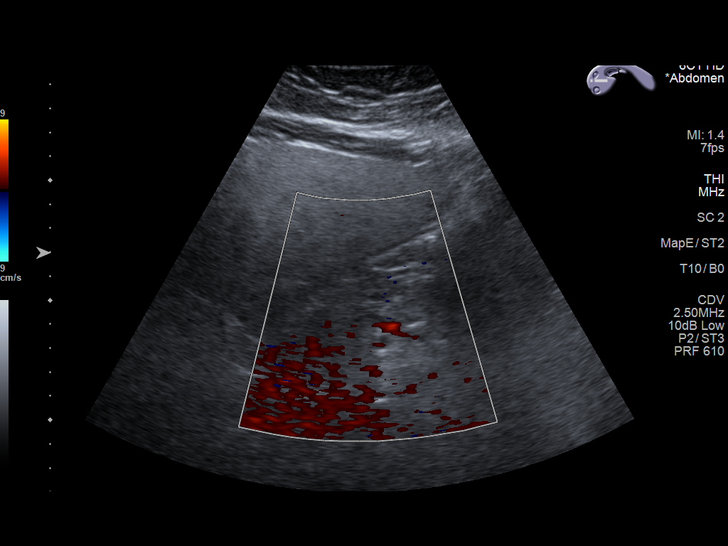
[im 93/140]
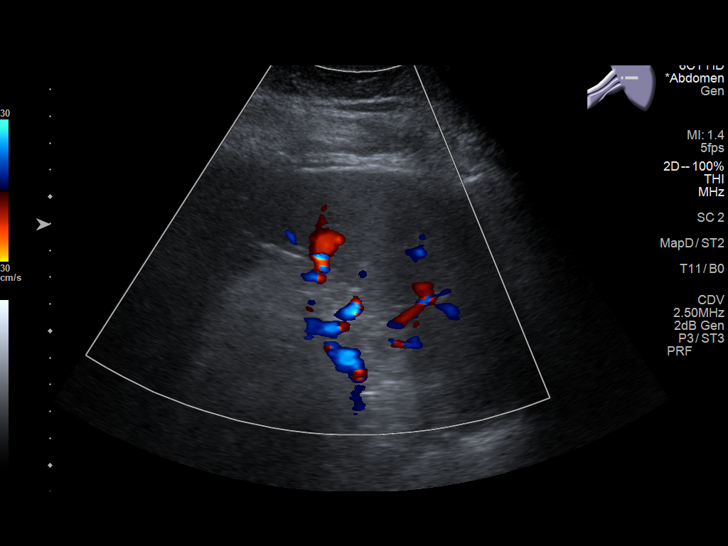
[im 105/140]
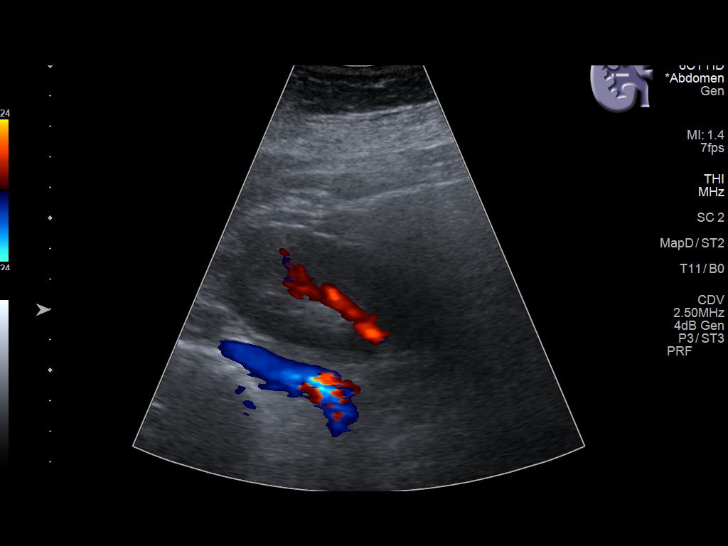
[im 116/140]
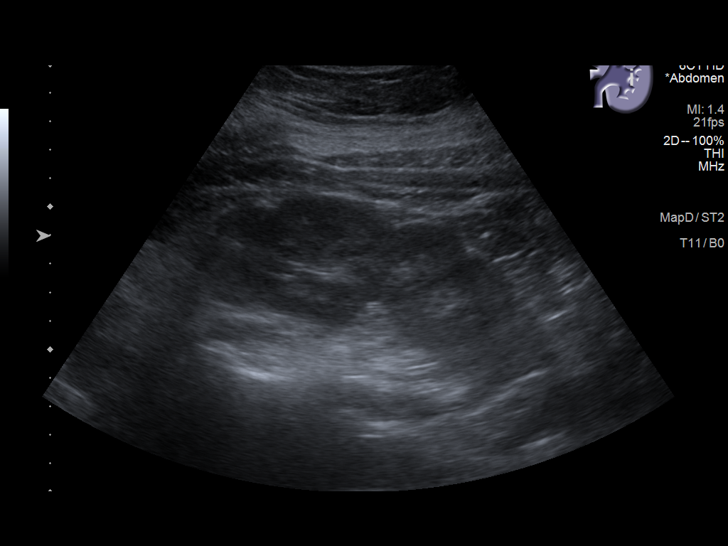
[im 128/140]
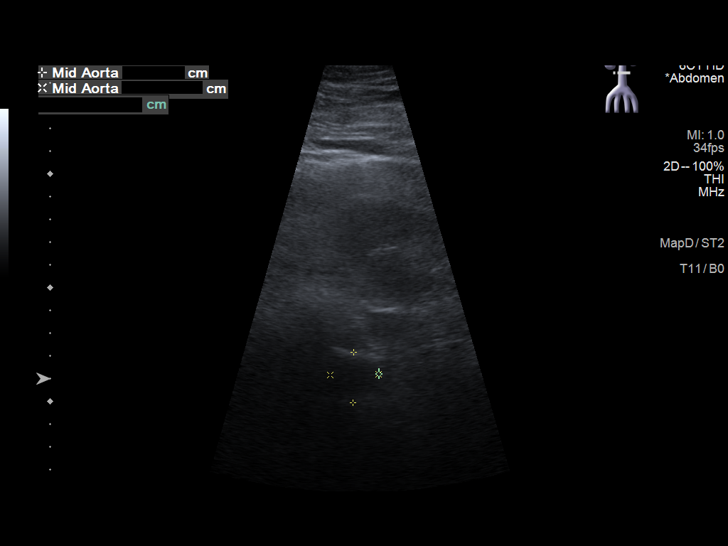
[im 140/140]
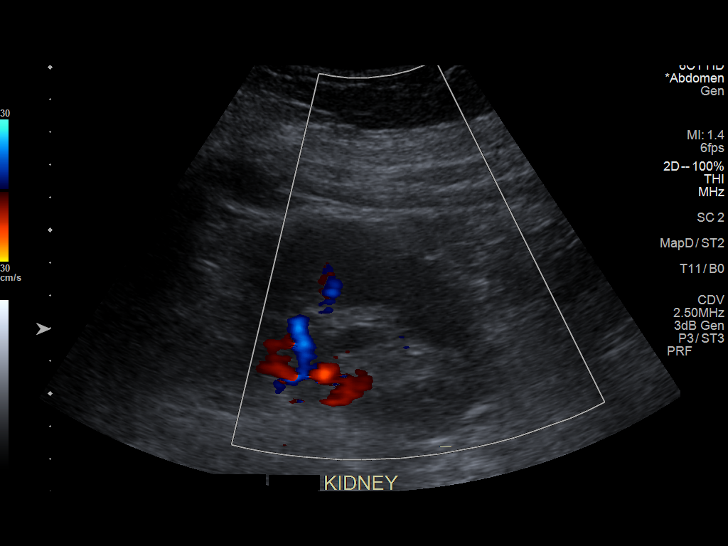

[13 of 25 positions shown; findings below may reference images not displayed]

FINDINGS: Gallbladder: Cholelithiasis is noted without gallbladder wall
thickening or pericholecystic fluid. No sonographic Murphy's sign is
noted.

Common bile duct: Diameter: 5 mm which is within normal limits.

Liver: No focal lesion identified. Increased echogenicity of hepatic
parenchyma is noted suggesting hepatic steatosis. Portal vein is
patent on color Doppler imaging with normal direction of blood flow
towards the liver.

IVC: No abnormality visualized.

Pancreas: Not visualized due to overlying bowel gas.

Spleen: Size and appearance within normal limits.

Right Kidney: Length: 12.6 cm. Echogenicity within normal limits. No
mass or hydronephrosis visualized.

Left Kidney: Length: 12.4 cm. 1.9 cm simple cyst is noted.
Echogenicity within normal limits. No mass or hydronephrosis
visualized.

Abdominal aorta: No aneurysm visualized.

Other findings: None.
IMPRESSION: Cholelithiasis without inflammation.

Increased echogenicity of hepatic parenchyma is noted suggesting
hepatic steatosis.

Pancreas not visualized due to overlying bowel gas.

## 2019-06-26 ENCOUNTER — Other Ambulatory Visit: Payer: Self-pay

## 2019-06-26 ENCOUNTER — Emergency Department: Payer: Medicare Other

## 2019-06-26 ENCOUNTER — Encounter: Payer: Self-pay | Admitting: Emergency Medicine

## 2019-06-26 ENCOUNTER — Inpatient Hospital Stay
Admission: EM | Admit: 2019-06-26 | Discharge: 2019-06-29 | DRG: 189 | Disposition: A | Discharge status: Home / Self Care | Payer: Medicare Other | Attending: Internal Medicine | Admitting: Internal Medicine

## 2019-06-26 DIAGNOSIS — E119 Type 2 diabetes mellitus without complications: Secondary | ICD-10-CM | POA: Diagnosis present

## 2019-06-26 DIAGNOSIS — Z886 Allergy status to analgesic agent status: Secondary | ICD-10-CM

## 2019-06-26 DIAGNOSIS — Z79899 Other long term (current) drug therapy: Secondary | ICD-10-CM

## 2019-06-26 DIAGNOSIS — I10 Essential (primary) hypertension: Secondary | ICD-10-CM | POA: Diagnosis present

## 2019-06-26 DIAGNOSIS — G4733 Obstructive sleep apnea (adult) (pediatric): Secondary | ICD-10-CM

## 2019-06-26 DIAGNOSIS — R0602 Shortness of breath: Secondary | ICD-10-CM | POA: Diagnosis not present

## 2019-06-26 DIAGNOSIS — Z880 Allergy status to penicillin: Secondary | ICD-10-CM

## 2019-06-26 DIAGNOSIS — J9601 Acute respiratory failure with hypoxia: Principal | ICD-10-CM | POA: Diagnosis present

## 2019-06-26 DIAGNOSIS — Z90710 Acquired absence of both cervix and uterus: Secondary | ICD-10-CM

## 2019-06-26 DIAGNOSIS — Z6841 Body Mass Index (BMI) 40.0 and over, adult: Secondary | ICD-10-CM

## 2019-06-26 DIAGNOSIS — J209 Acute bronchitis, unspecified: Principal | ICD-10-CM | POA: Diagnosis present

## 2019-06-26 DIAGNOSIS — Z803 Family history of malignant neoplasm of breast: Secondary | ICD-10-CM

## 2019-06-26 DIAGNOSIS — F17200 Nicotine dependence, unspecified, uncomplicated: Secondary | ICD-10-CM | POA: Diagnosis present

## 2019-06-26 DIAGNOSIS — Z20822 Contact with and (suspected) exposure to covid-19: Secondary | ICD-10-CM | POA: Diagnosis present

## 2019-06-26 DIAGNOSIS — E785 Hyperlipidemia, unspecified: Secondary | ICD-10-CM | POA: Diagnosis present

## 2019-06-26 DIAGNOSIS — Z79891 Long term (current) use of opiate analgesic: Secondary | ICD-10-CM

## 2019-06-26 DIAGNOSIS — R0902 Hypoxemia: Secondary | ICD-10-CM | POA: Diagnosis present

## 2019-06-26 DIAGNOSIS — J440 Chronic obstructive pulmonary disease with acute lower respiratory infection: Secondary | ICD-10-CM | POA: Diagnosis present

## 2019-06-26 DIAGNOSIS — F329 Major depressive disorder, single episode, unspecified: Secondary | ICD-10-CM | POA: Diagnosis present

## 2019-06-26 DIAGNOSIS — J329 Chronic sinusitis, unspecified: Secondary | ICD-10-CM

## 2019-06-26 DIAGNOSIS — Z7952 Long term (current) use of systemic steroids: Secondary | ICD-10-CM

## 2019-06-26 DIAGNOSIS — E7800 Pure hypercholesterolemia, unspecified: Secondary | ICD-10-CM | POA: Diagnosis present

## 2019-06-26 LAB — CBC
HCT: 39.8 % (ref 36.0–46.0)
Hemoglobin: 13.6 g/dL (ref 12.0–15.0)
MCH: 31.7 pg (ref 26.0–34.0)
MCHC: 34.2 g/dL (ref 30.0–36.0)
MCV: 92.8 fL (ref 80.0–100.0)
Platelets: 207 10*3/uL (ref 150–400)
RBC: 4.29 MIL/uL (ref 3.87–5.11)
RDW: 14.4 % (ref 11.5–15.5)
WBC: 6.5 10*3/uL (ref 4.0–10.5)
nRBC: 0 % (ref 0.0–0.2)

## 2019-06-26 LAB — BASIC METABOLIC PANEL
Anion gap: 11 (ref 5–15)
BUN: 11 mg/dL (ref 6–20)
CO2: 25 mmol/L (ref 22–32)
Calcium: 9.4 mg/dL (ref 8.9–10.3)
Chloride: 101 mmol/L (ref 98–111)
Creatinine, Ser: 0.74 mg/dL (ref 0.44–1.00)
GFR calc Af Amer: >60 mL/min (ref >60)
GFR calc non Af Amer: >60 mL/min (ref >60)
Glucose, Bld: 198 mg/dL — ABNORMAL HIGH (ref 70–99)
Potassium: 3.6 mmol/L (ref 3.5–5.1)
Sodium: 137 mmol/L (ref 135–145)

## 2019-06-26 LAB — TROPONIN I (HIGH SENSITIVITY)
Troponin I (High Sensitivity): 3 ng/L (ref <18)
Troponin I (High Sensitivity): 4 ng/L (ref <18)

## 2019-06-26 MED ORDER — ALBUTEROL SULFATE HFA 108 (90 BASE) MCG/ACT IN AERS: 2.0000 | INHALATION_SPRAY | RESPIRATORY_TRACT | 0 refills | Status: DC | PRN

## 2019-06-26 MED ORDER — ALBUTEROL SULFATE (2.5 MG/3ML) 0.083% IN NEBU
5.0000 mg | INHALATION_SOLUTION | Freq: Once | RESPIRATORY_TRACT | Status: AC
  Administered 2019-06-26: 20:00:00 5 mg via RESPIRATORY_TRACT
  Filled 2019-06-26: qty 6

## 2019-06-26 MED ORDER — IPRATROPIUM-ALBUTEROL 0.5-2.5 (3) MG/3ML IN SOLN
3.0000 mL | Freq: Once | RESPIRATORY_TRACT | Status: AC
  Administered 2019-06-26: 20:00:00 3 mL via RESPIRATORY_TRACT
  Filled 2019-06-26: qty 3

## 2019-06-26 MED ORDER — PREDNISONE 20 MG PO TABS: 40.0000 mg | ORAL_TABLET | Freq: Every day | ORAL | 0 refills | Status: DC

## 2019-06-26 MED ORDER — PREDNISONE 20 MG PO TABS
60.0000 mg | ORAL_TABLET | Freq: Once | ORAL | Status: AC
  Administered 2019-06-26: 20:00:00 60 mg via ORAL
  Filled 2019-06-26: qty 3

## 2019-06-26 NOTE — ED Notes (Signed)
First Nurse Note: Pt to ED via POV c/o Shortness of breath. Pt does appear to be labored. Pt SpO2 sat is 93% on room air, HR 110

## 2019-06-26 NOTE — ED Notes (Signed)
Pt becomes dyspneic w/ ambulation, oxygen saturation drops to 88%.

## 2019-06-26 NOTE — ED Provider Notes (Addendum)
[facility] Emergency Department Provider Note  ____________________________________________  Time seen: Approximately 7:43 PM  I have reviewed the triage vital signs and the nursing notes.   HISTORY  Chief Complaint Shortness of Breath    HPI Taylor [name] is a 59 y.o. female with a history of hypertension and hyperlipidemia who comes the ED complaining of nasal congestion, postnasal drip and cough, and shortness of breath for the past 3 or 4 days.  Constant, worse with walking, no alleviating factors, trying Mucinex without relief.  Also currently taking Bactrim for sinus infection.  Normally uses CPAP but not able to use last few nights due to the nasal congestion.  Has PCP, Dr. Doy Hutching.  Denies chest pain or fever.  No Covid exposure.  Does not use oxygen at home.   No recent travel trauma hospitalization surgery, no DVT PE, no leg pain or swelling.   Past Medical History:  Diagnosis Date  . Depression   . High cholesterol   . Hypertension      There are no problems to display for this patient.    Past Surgical History:  Procedure Laterality Date  . ABDOMINAL HYSTERECTOMY    . BREAST BIOPSY Left 08-23-2013   stereo, negative     Prior to Admission medications   Medication Sig Start Date End Date Taking? Authorizing Provider  albuterol (PROVENTIL HFA) 108 (90 Base) MCG/ACT inhaler Inhale 2 puffs into the lungs every 4 (four) hours as needed for wheezing or shortness of breath. 06/26/19   Carrie Mew, MD  oxyCODONE-acetaminophen (PERCOCET) 10-325 MG per tablet Take 1 tablet by mouth every 4 (four) hours as needed for pain. 10/08/13   Erline Levine, MD  pravastatin (PRAVACHOL) 40 MG tablet Take 40 mg by mouth at bedtime.    [provider]  predniSONE (DELTASONE) 20 MG tablet Take 2 tablets (40 mg total) by mouth daily. 06/26/19   Carrie Mew, MD  traMADol (ULTRAM) 50 MG tablet Take 50 mg by mouth every 6 (six) hours as  needed for moderate pain.    [provider]     Allergies Aspirin, Penicillin g, and Penicillins   Family History  Problem Relation Age of Onset  . Breast cancer Mother 65    Social History Social History   Tobacco Use  . Smoking status: Current Every Day Smoker  Substance Use Topics  . Alcohol use: Yes  . Drug use: No    Review of Systems  Constitutional:   No fever or chills.  ENT:   No sore throat. No rhinorrhea. Cardiovascular:   No chest pain or syncope. Respiratory: Positive shortness of breath and nonproductive cough. Gastrointestinal:   Negative for abdominal pain, vomiting and diarrhea.  Musculoskeletal:   Negative for focal pain or swelling All other systems reviewed and are negative except as documented above in ROS and HPI.  ____________________________________________   PHYSICAL EXAM:  VITAL SIGNS: ED Triage Vitals  Enc Vitals Group     BP 06/26/19 1453 139/80     Pulse Rate 06/26/19 1453 (!) 109     Resp 06/26/19 1453 18     Temp 06/26/19 1453 98.5 F (36.9 C)     Temp Source 06/26/19 1453 Oral     SpO2 06/26/19 1453 93 %     Weight 06/26/19 1454 260 lb (117.9 kg)     Height 06/26/19 1454 5\' 5"  (1.651 m)     Head Circumference --      Peak Flow --  Pain Score 06/26/19 1453 0     Pain Loc --      Pain Edu? --      Excl. in [place]? --     Vital signs reviewed, nursing assessments reviewed.   Constitutional:   Alert and oriented. Non-toxic appearance. Eyes:   Conjunctivae are normal. EOMI. PERRL. ENT      Head:   Normocephalic and atraumatic.      Nose:   Wearing a mask.      Mouth/Throat:   Wearing a mask.      Neck:   No meningismus. Full ROM. Hematological/Lymphatic/Immunilogical:   No cervical lymphadenopathy. Cardiovascular:   RRR. Symmetric bilateral radial and DP pulses.  No murmurs. Cap refill less than 2 seconds. Respiratory:   Normal respiratory effort without tachypnea/retractions.  Diffuse expiratory wheezing and  prolonged expiratory phase, accentuated by FEV1 maneuver Gastrointestinal:   Soft and nontender. Non distended. There is no CVA tenderness.  No rebound, rigidity, or guarding.  Musculoskeletal:   Normal range of motion in all extremities. No joint effusions.  No lower extremity tenderness.  Negative Homans' sign, no palpable cords.  Left calf slightly greater circumference than right calf which is chronic per patient. Neurologic:   Normal speech and language.  Motor grossly intact. No acute focal neurologic deficits are appreciated.  Skin:    Skin is warm, dry and intact. No rash noted.  No petechiae, purpura, or bullae.  ____________________________________________    LABS (pertinent positives/negatives) (all labs ordered are listed, but only abnormal results are displayed) Labs Reviewed  BASIC METABOLIC PANEL - Abnormal; Notable for the following components:      Result Value   Glucose, Bld 198 (*)    All other components within normal limits  SARS CORONAVIRUS 2 (TAT 6-24 HRS)  CBC  POC SARS CORONAVIRUS 2 AG -  ED  TROPONIN I (HIGH SENSITIVITY)  TROPONIN I (HIGH SENSITIVITY)   ____________________________________________   EKG  Interpreted by me Sinus tachycardia rate 103, normal axis intervals.  Poor R wave progression.  Normal ST segments and T waves.  ____________________________________________    RADIOLOGY  DG Chest 2 View  Result Date: 06/26/2019 CLINICAL DATA:  Shortness of breath. EXAM: CHEST - 2 VIEW COMPARISON:  Jul 20, 2010. FINDINGS: The heart size and mediastinal contours are within normal limits. Both lungs are clear. No pneumothorax or pleural effusion is noted. The visualized skeletal structures are unremarkable. IMPRESSION: No active cardiopulmonary disease. Electronically Signed   By: Marijo Conception M.D.   On: 06/26/2019 15:22     ____________________________________________   PROCEDURES Procedures  ____________________________________________  DIFFERENTIAL DIAGNOSIS   COPD exacerbation, bronchospasm, pneumonia, other than pleural effusion  CLINICAL IMPRESSION / ASSESSMENT AND PLAN / ED COURSE  Medications ordered in the ED: Medications  predniSONE (DELTASONE) tablet 60 mg (60 mg Oral Given 06/26/19 1955)  ipratropium-albuterol (DUONEB) 0.5-2.5 (3) MG/3ML nebulizer solution 3 mL (3 mLs Nebulization Given 06/26/19 1956)  albuterol (PROVENTIL) (2.5 MG/3ML) 0.083% nebulizer solution 5 mg (5 mg Nebulization Given 06/26/19 1955)    Pertinent labs & imaging results that were available during my care of the patient were reviewed by me and considered in my medical decision making (see chart for details).  Taylor [name] was evaluated in Emergency Department on 06/26/2019 for the symptoms described in the history of present illness. She was evaluated in the context of the global COVID-19 pandemic, which necessitated consideration that the patient might be at risk for infection with the SARS-CoV-2  virus that causes COVID-19. Institutional protocols and algorithms that pertain to the evaluation of patients at risk for COVID-19 are in a state of rapid change based on information released by regulatory bodies including the CDC and federal and state organizations. These policies and algorithms were followed during the patient's care in the ED.   Patient presents with shortness of breath, nasal congestion and postnasal drip with cough.  Exam consistent with bronchospasm/COPD.  She does not have a formal diagnosis of COPD, but she does have a 30-year smoking history although she has quit.  I suspect she has undiagnosed COPD.  EKG, chest x-ray, labs are all unremarkable and reassuring.  She is nontoxic and exam is generally reassuring though indicative of primary lung issue.   Considering the patient's symptoms, medical history,  and physical examination today, I have low suspicion for ACS, PE, TAD, pneumothorax, carditis, mediastinitis, pneumonia, CHF, or sepsis.     She is currently taking Bactrim for sinusitis, which is sufficient antibiotic prophylaxis currently.  I will start her on a course of prednisone, give bronchodilators in the ED and prescribed albuterol, recommend follow-up with primary care and seek referral to pulmonology.    Clinical Course as of Jun 25 2312  Sat Jun 26, 2019  2306 Patient still with pronounced wheezing, hypoxic to 87% with ambulation.  Will need to hospitalize for likely exacerbation of undiagnosed COPD.  Doubt ACS PE dissection.  Not septic.   [PS]    Clinical Course User Index [PS] Carrie Mew, MD     ____________________________________________   FINAL CLINICAL IMPRESSION(S) / ED DIAGNOSES    Final diagnoses:  Bronchospasm with bronchitis, acute  Shortness of breath  Acute respiratory failure with hypoxia [facility])     ED Discharge Orders         Ordered    predniSONE (DELTASONE) 20 MG tablet  Daily     06/26/19 1948    albuterol (PROVENTIL HFA) 108 (90 Base) MCG/ACT inhaler  Every 4 hours PRN     06/26/19 1948          Portions of this note were generated with dragon dictation software. Dictation errors may occur despite best attempts at proofreading.   Carrie Mew, MD 06/26/19 1950    Carrie Mew, MD 06/26/19 2314

## 2019-06-26 NOTE — ED Notes (Signed)
Nebulizer treatment complete. Pt placed on 3 liter's [place].

## 2019-06-26 NOTE — ED Notes (Signed)
Pt ambulated in room, pt is SOB, but less so than initial assessment. Pt desaturates to 87% w/ ambulation.

## 2019-06-26 NOTE — ED Notes (Signed)
Visitor provided pillow per request.

## 2019-06-26 NOTE — ED Triage Notes (Signed)
Pt arrived via POV with c/o shortness of breath, states she is having trouble taking in a deep breath.  Pt is short of breath on exertion and at rest.  Pt reports feeling like a sinus infection.  Pt reports cough started 3-4 days ago, states she is having yellow sputum.  Pt reports shortness of breath when laying down at night, pt has CPAP but has not been to use the past few nights due to feeling like it is pushing the drainage down and making her cough more.  Denies fevers or sore throat.

## 2019-06-26 NOTE — ED Notes (Addendum)
Lung sounds are clear bilaterally. Cough noted. Pt assisted to bathroom

## 2019-06-26 NOTE — ED Notes (Signed)
2 liter's [place] reapplied

## 2019-06-26 NOTE — ED Notes (Signed)
Pt given water to drink. 

## 2019-06-27 ENCOUNTER — Encounter: Payer: Self-pay | Admitting: Internal Medicine

## 2019-06-27 DIAGNOSIS — J209 Acute bronchitis, unspecified: Secondary | ICD-10-CM | POA: Diagnosis present

## 2019-06-27 DIAGNOSIS — J440 Chronic obstructive pulmonary disease with acute lower respiratory infection: Secondary | ICD-10-CM | POA: Diagnosis present

## 2019-06-27 DIAGNOSIS — J329 Chronic sinusitis, unspecified: Secondary | ICD-10-CM

## 2019-06-27 DIAGNOSIS — E7800 Pure hypercholesterolemia, unspecified: Secondary | ICD-10-CM | POA: Diagnosis present

## 2019-06-27 DIAGNOSIS — E119 Type 2 diabetes mellitus without complications: Secondary | ICD-10-CM | POA: Diagnosis present

## 2019-06-27 DIAGNOSIS — E785 Hyperlipidemia, unspecified: Secondary | ICD-10-CM

## 2019-06-27 DIAGNOSIS — R0602 Shortness of breath: Secondary | ICD-10-CM

## 2019-06-27 DIAGNOSIS — Z90710 Acquired absence of both cervix and uterus: Secondary | ICD-10-CM | POA: Diagnosis not present

## 2019-06-27 DIAGNOSIS — Z7952 Long term (current) use of systemic steroids: Secondary | ICD-10-CM | POA: Diagnosis not present

## 2019-06-27 DIAGNOSIS — Z6841 Body Mass Index (BMI) 40.0 and over, adult: Secondary | ICD-10-CM | POA: Diagnosis not present

## 2019-06-27 DIAGNOSIS — Z79891 Long term (current) use of opiate analgesic: Secondary | ICD-10-CM | POA: Diagnosis not present

## 2019-06-27 DIAGNOSIS — Z803 Family history of malignant neoplasm of breast: Secondary | ICD-10-CM | POA: Diagnosis not present

## 2019-06-27 DIAGNOSIS — F329 Major depressive disorder, single episode, unspecified: Secondary | ICD-10-CM | POA: Diagnosis present

## 2019-06-27 DIAGNOSIS — J9601 Acute respiratory failure with hypoxia: Principal | ICD-10-CM

## 2019-06-27 DIAGNOSIS — Z880 Allergy status to penicillin: Secondary | ICD-10-CM | POA: Diagnosis not present

## 2019-06-27 DIAGNOSIS — Z79899 Other long term (current) drug therapy: Secondary | ICD-10-CM | POA: Diagnosis not present

## 2019-06-27 DIAGNOSIS — Z20822 Contact with and (suspected) exposure to covid-19: Secondary | ICD-10-CM | POA: Diagnosis present

## 2019-06-27 DIAGNOSIS — R0902 Hypoxemia: Secondary | ICD-10-CM | POA: Diagnosis present

## 2019-06-27 DIAGNOSIS — G4733 Obstructive sleep apnea (adult) (pediatric): Secondary | ICD-10-CM

## 2019-06-27 DIAGNOSIS — Z886 Allergy status to analgesic agent status: Secondary | ICD-10-CM | POA: Diagnosis not present

## 2019-06-27 DIAGNOSIS — I10 Essential (primary) hypertension: Secondary | ICD-10-CM | POA: Diagnosis present

## 2019-06-27 DIAGNOSIS — F17200 Nicotine dependence, unspecified, uncomplicated: Secondary | ICD-10-CM | POA: Diagnosis present

## 2019-06-27 LAB — CBC
HCT: 40.7 % (ref 36.0–46.0)
Hemoglobin: 13.2 g/dL (ref 12.0–15.0)
MCH: 31.5 pg (ref 26.0–34.0)
MCHC: 32.4 g/dL (ref 30.0–36.0)
MCV: 97.1 fL (ref 80.0–100.0)
Platelets: 251 10*3/uL (ref 150–400)
RBC: 4.19 MIL/uL (ref 3.87–5.11)
RDW: 14.6 % (ref 11.5–15.5)
WBC: 7 10*3/uL (ref 4.0–10.5)
nRBC: 0 % (ref 0.0–0.2)

## 2019-06-27 LAB — CREATININE, SERUM
Creatinine, Ser: 0.74 mg/dL (ref 0.44–1.00)
GFR calc Af Amer: >60 mL/min (ref >60)
GFR calc non Af Amer: >60 mL/min (ref >60)

## 2019-06-27 LAB — BASIC METABOLIC PANEL
Anion gap: 11 (ref 5–15)
BUN: 12 mg/dL (ref 6–20)
CO2: 24 mmol/L (ref 22–32)
Calcium: 9.2 mg/dL (ref 8.9–10.3)
Chloride: 100 mmol/L (ref 98–111)
Creatinine, Ser: 0.72 mg/dL (ref 0.44–1.00)
GFR calc Af Amer: >60 mL/min (ref >60)
GFR calc non Af Amer: >60 mL/min (ref >60)
Glucose, Bld: 315 mg/dL — ABNORMAL HIGH (ref 70–99)
Potassium: 4.6 mmol/L (ref 3.5–5.1)
Sodium: 135 mmol/L (ref 135–145)

## 2019-06-27 LAB — SARS CORONAVIRUS 2 (TAT 6-24 HRS): SARS Coronavirus 2: NEGATIVE

## 2019-06-27 LAB — HIV ANTIBODY (ROUTINE TESTING W REFLEX): HIV Screen 4th Generation wRfx: NONREACTIVE

## 2019-06-27 LAB — POC SARS CORONAVIRUS 2 AG: SARS Coronavirus 2 Ag: NEGATIVE

## 2019-06-27 MED ORDER — PRAVASTATIN SODIUM 20 MG PO TABS
40.0000 mg | ORAL_TABLET | Freq: Every day | ORAL | Status: DC
  Administered 2019-06-27: 17:00:00 40 mg via ORAL
  Filled 2019-06-27: qty 2
  Filled 2019-06-27 (×2): qty 1

## 2019-06-27 MED ORDER — SULFAMETHOXAZOLE-TRIMETHOPRIM 800-160 MG PO TABS
1.0000 | ORAL_TABLET | Freq: Two times a day (BID) | ORAL | Status: DC
  Administered 2019-06-27 – 2019-06-29 (×5): 1 via ORAL
  Filled 2019-06-27 (×6): qty 1

## 2019-06-27 MED ORDER — ENOXAPARIN SODIUM 40 MG/0.4ML ~~LOC~~ SOLN
40.0000 mg | Freq: Two times a day (BID) | SUBCUTANEOUS | Status: DC
  Administered 2019-06-27 – 2019-06-29 (×4): 40 mg via SUBCUTANEOUS
  Filled 2019-06-27 (×5): qty 0.4

## 2019-06-27 MED ORDER — IPRATROPIUM-ALBUTEROL 0.5-2.5 (3) MG/3ML IN SOLN
3.0000 mL | Freq: Four times a day (QID) | RESPIRATORY_TRACT | Status: DC
  Administered 2019-06-27 – 2019-06-28 (×6): 3 mL via RESPIRATORY_TRACT
  Filled 2019-06-27 (×6): qty 3

## 2019-06-27 MED ORDER — GUAIFENESIN-DM 100-10 MG/5ML PO SYRP
5.0000 mL | ORAL_SOLUTION | ORAL | Status: DC | PRN
  Administered 2019-06-28: 07:00:00 5 mL via ORAL
  Filled 2019-06-27 (×2): qty 5

## 2019-06-27 NOTE — Progress Notes (Signed)
SATURATION QUALIFICATIONS: (This note is used to comply with regulatory documentation for home oxygen)  Patient Saturations on Room Air at Rest = 90%  Patient Saturations on Room Air while Ambulating = 86%  Patient Saturations on 3 Liters of oxygen while Ambulating = 92%  Please briefly explain why patient needs home oxygen: 

## 2019-06-27 NOTE — H&P (Signed)
History and Physical        Hospital Admission Note Date: 06/27/2019  Patient name: Taylor [name] Medical record number: [id number] Date of birth: 1960-05-28 Age: 59 y.o. Gender: female  PCP: Idelle Crouch, MD    Patient coming from: Home   I have reviewed all records in the [facility].    Chief Complaint:  SOB   HPI: Taylor [name] is 59 y.o. female with PMH of HLD and OSA who presents to ED with cough and SOB x3-4 days. Reports cough was initially productive of sputum but is now dry. She is currently taking Bactrim for sinus infection. She typically gets a sinus infection around this time of year. Reports many years ago she had respiratory failure related to sinusitis. Has no prior h/o asthma or COPD. Does not use home O2. Was smoker for 15 years, quit about 3 years ago. No COVID exposure. Has not been vaccinated. Denies chest pain and leg edema.    ED work-up/course:  Patient presents with shortness of breath, nasal congestion and postnasal drip with cough.  Exam consistent with bronchospasm/COPD.  She does not have a formal diagnosis of COPD, but she does have a 30-year smoking history although she has quit.  I suspect she has undiagnosed COPD.  EKG, chest x-ray, labs are all unremarkable and reassuring.  She is nontoxic and exam is generally reassuring though indicative of primary lung issue.   Considering the patient's symptoms, medical history, and physical examination today, I have low suspicion for ACS, PE, TAD, pneumothorax, carditis, mediastinitis, pneumonia, CHF, or sepsis.     She is currently taking Bactrim for sinusitis, which is sufficient antibiotic prophylaxis currently.  I will start her on a course of prednisone, give bronchodilators in the ED and prescribed albuterol, recommend follow-up with primary care and seek referral to pulmonology.   Patient still with pronounced wheezing, hypoxic to  87% with ambulation.  Will need to hospitalize for likely exacerbation of undiagnosed COPD.  Doubt ACS PE dissection.  Not septic.      Review of Systems: Positives marked in 'bold' Constitutional: Denies fever, chills, diaphoresis, poor appetite and fatigue.  HEENT: Denies photophobia, eye pain, redness, hearing loss, ear pain, congestion, sore throat, rhinorrhea, sneezing, mouth sores, trouble swallowing, neck pain, neck stiffness and tinnitus.   Respiratory: Denies SOB, DOE, cough, chest tightness,  and wheezing.   Cardiovascular: Denies chest pain, palpitations and leg swelling.  Gastrointestinal: Denies nausea, vomiting, abdominal pain, diarrhea, constipation, blood in stool and abdominal distention.  Genitourinary: Denies dysuria, urgency, frequency, hematuria, flank pain and difficulty urinating.  Musculoskeletal: Denies myalgias, back pain, joint swelling, arthralgias and gait problem.  Skin: Denies pallor, rash and wound.  Neurological: Denies dizziness, seizures, syncope, weakness, light-headedness, numbness and headaches.  Hematological: Denies adenopathy. Easy bruising, personal or family bleeding history  Psychiatric/Behavioral: Denies suicidal ideation, mood changes, confusion, nervousness, sleep disturbance and agitation  Past Medical History: Past Medical History:  Diagnosis Date  . Depression   . High cholesterol   . Hypertension     Past Surgical History:  Procedure Laterality Date  . ABDOMINAL HYSTERECTOMY    . BREAST BIOPSY Left 08-23-2013  stereo, negative    Medications: Prior to Admission medications   Medication Sig Start Date End Date Taking? Authorizing Provider  albuterol (PROVENTIL HFA) 108 (90 Base) MCG/ACT inhaler Inhale 2 puffs into the lungs every 4 (four) hours as needed for wheezing or shortness of breath. 06/26/19   Carrie Mew, MD  oxyCODONE-acetaminophen (PERCOCET) 10-325 MG per tablet Take 1 tablet by mouth every 4 (four) hours as needed  for pain. 10/08/13   Erline Levine, MD  pravastatin (PRAVACHOL) 40 MG tablet Take 40 mg by mouth at bedtime.    [provider]  predniSONE (DELTASONE) 20 MG tablet Take 2 tablets (40 mg total) by mouth daily. 06/26/19   Carrie Mew, MD  traMADol (ULTRAM) 50 MG tablet Take 50 mg by mouth every 6 (six) hours as needed for moderate pain.    [provider]    Allergies:   Allergies  Allergen Reactions  . Aspirin Hives and Swelling  . Penicillin G Hives  . Penicillins Hives    Social History:  reports that she has been smoking. She does not have any smokeless tobacco history on file. She reports current alcohol use. She reports that she does not use drugs.  Family History: Family History  Problem Relation Age of Onset  . Breast cancer Mother 90    Physical Exam: Blood pressure 132/69, pulse 96, temperature 98.5 F (36.9 C), temperature source Oral, resp. rate 18, height 5\' 5"  (1.651 m), weight 117.9 kg, SpO2 93 %. General: Alert, awake, oriented x3, in no acute distress. Eyes: pink conjunctiva,anicteric sclera, pupils equal and reactive to light and accomodation, HEENT: normocephalic, atraumatic, oropharynx clear Neck: supple, no masses or lymphadenopathy, no goiter, no bruits, no JVD CVS: Regular rate and rhythm, without murmurs, rubs or gallops. No lower extremity edema Resp : Diffuse wheezing present with prolonged expiratory phase. No tachypnea or retractions present. [place] in place.  GI : Soft, nontender, nondistended, positive bowel sounds, no masses. No hepatomegaly. No hernia.  Musculoskeletal: No clubbing or cyanosis, positive pedal pulses. No contracture. ROM intact  Neuro: Grossly intact, no focal neurological deficits, strength 5/5 upper and lower extremities bilaterally Psych: alert and oriented x 3, normal mood and affect Skin: no rashes or lesions, warm and dry   LABS on Admission: I have personally reviewed all the labs and imagings below     Basic Metabolic Panel: Recent Labs  Lab 06/26/19 1501  NA 137  K 3.6  CL 101  CO2 25  GLUCOSE 198*  BUN 11  CREATININE 0.74  CALCIUM 9.4   Liver Function Tests: No results for input(s): AST, ALT, ALKPHOS, BILITOT, PROT, ALBUMIN in the last 168 hours. No results for input(s): LIPASE, AMYLASE in the last 168 hours. No results for input(s): AMMONIA in the last 168 hours. CBC: Recent Labs  Lab 06/26/19 1501  WBC 6.5  HGB 13.6  HCT 39.8  MCV 92.8  PLT 207   Cardiac Enzymes: No results for input(s): CKTOTAL, CKMB, CKMBINDEX, TROPONINI in the last 168 hours. BNP: Invalid input(s): POCBNP CBG: No results for input(s): GLUCAP in the last 168 hours.  Radiological Exams on Admission:  DG Chest 2 View  Result Date: 06/26/2019 CLINICAL DATA:  Shortness of breath. EXAM: CHEST - 2 VIEW COMPARISON:  Jul 20, 2010. FINDINGS: The heart size and mediastinal contours are within normal limits. Both lungs are clear. No pneumothorax or pleural effusion is noted. The visualized skeletal structures are unremarkable. IMPRESSION: No active cardiopulmonary disease. Electronically Signed  By: Marijo Conception M.D.   On: 06/26/2019 15:22      EKG: Independently reviewed. No ST segment changes.    Assessment/Plan Active Problems:   Hypoxia   Hyperlipidemia   Sinusitis   OSA (obstructive sleep apnea)  Acute Respiratory Failure with Hypoxia  Desaturation occurred with movement and walking. Maintaining appropriate O2 saturations on 2L [place]. CXR neg. No leukocytosis. Low suspicion for bacterial etiology such as CAP. No evidence of VTE on exam or history. Suspect related to bronchospasm with recent sinus infection. May have underlying degree of COPD given smoking history.  -admit to MedSurg, continuous pulse ox  -patient received Prednisone in ED, reevaluate in AM for continued need of steroids  -Duonebs q6h prn  -continue O2 [place], wean as tolerated  -COVID test pending  -patient may benefit  from Rx for rescue inhaler at discharge, consider outpatient work up for lung disease and initiation of appropriate therapy   OSA  -CPAP QHS   HLD  -pravastatin    DVT prophylaxis: Lovenox   CODE STATUS: FULL   Consults called:   Family Communication: Admission, patients condition and plan of care including tests being ordered have been discussed with the patient and patient's husband who indicates understanding and agree with the plan and Code Status  Admission status:   The medical decision making on this patient was of high complexity and the patient is at high risk for clinical deterioration, therefore this is a level 3 admission.  Severity of Illness:     Moderate  The appropriate patient status for this patient is OBSERVATION. Observation status is judged to be reasonable and necessary in order to provide the required intensity of service to ensure the patient's safety. The patient's presenting symptoms, physical exam findings, and initial radiographic and laboratory data in the context of their medical condition is felt to place them at decreased risk for further clinical deterioration. Furthermore, it is anticipated that the patient will be medically stable for discharge from the hospital within 2 midnights of admission. The following factors support the patient status of observation.   " The patient's presenting symptoms include cough, SOB. " The physical exam findings include wheezes, hypoxia. " The initial radiographic and laboratory data are CXR neg, no leukocytosis.     Time Spent on Admission: 38 min     Melina Schools D.O.  [facility] 06/27/2019, 12:13 AM

## 2019-06-27 NOTE — ED Notes (Signed)
O2 sats decreased to 91% when patient layed down; O2 was increased  4L, pt's O2 sats 95% at this time

## 2019-06-27 NOTE — Progress Notes (Signed)
Same day rounding progress note  Patient seen and examined while in the ED. Patient was agitated waiting for Covid results and bed upstairs in the hospital   Acute Respiratory Failure with Hypoxia  Desaturation occurred with movement and walking. Maintaining appropriate O2 saturations on 4 L oxygen via nasal cannula. - CXR neg. No leukocytosis. Low suspicion for bacterial etiology such as CAP. No evidence of VTE on exam or history. Suspect related to bronchospasm with recent sinus infection. May have underlying degree of COPD given smoking history.  -As she does not have any wheezing, we will hold off steroids -Duonebs q6h prn  -continue O2 [place], wean as tolerated  -COVID test negative -patient may benefit from Rx for rescue inhaler at discharge, consider outpatient work up for lung disease and initiation of appropriate therapy   OSA  -CPAP QHS   HLD  -pravastatin   Time spent: 15 minutes

## 2019-06-27 NOTE — Progress Notes (Signed)
PHARMACIST - PHYSICIAN COMMUNICATION  CONCERNING:  Enoxaparin (Lovenox) for DVT Prophylaxis    RECOMMENDATION: Patient was prescribed enoxaprin 40mg  q24 hours for VTE prophylaxis.   Filed Weights   06/26/19 1454  Weight: 117.9 kg (260 lb)    Body mass index is 43.27 kg/m.  Estimated Creatinine Clearance: 96.1 mL/min (by C-G formula based on SCr of 0.74 mg/dL).   Based on [place] patient is candidate for enoxaparin 40mg  every 12 hour dosing due to BMI being >40.  DESCRIPTION: Pharmacy has adjusted enoxaparin dose per [facility] policy.  Patient is now receiving enoxaparin 40mg  every 12 hours.    Ena Dawley, PharmD Clinical Pharmacist  06/27/2019 3:04 AM

## 2019-06-28 ENCOUNTER — Inpatient Hospital Stay: Payer: Medicare Other

## 2019-06-28 DIAGNOSIS — J209 Acute bronchitis, unspecified: Secondary | ICD-10-CM | POA: Diagnosis not present

## 2019-06-28 DIAGNOSIS — E785 Hyperlipidemia, unspecified: Secondary | ICD-10-CM | POA: Diagnosis not present

## 2019-06-28 DIAGNOSIS — J9601 Acute respiratory failure with hypoxia: Secondary | ICD-10-CM | POA: Diagnosis not present

## 2019-06-28 LAB — GLUCOSE, CAPILLARY
Glucose-Capillary: 169 mg/dL — ABNORMAL HIGH (ref 70–99)
Glucose-Capillary: 262 mg/dL — ABNORMAL HIGH (ref 70–99)

## 2019-06-28 LAB — HEMOGLOBIN A1C
Hgb A1c MFr Bld: 8 % — ABNORMAL HIGH (ref 4.8–5.6)
Mean Plasma Glucose: 182.9 mg/dL

## 2019-06-28 MED ORDER — INSULIN ASPART 100 UNIT/ML ~~LOC~~ SOLN: 0.0000 [IU] | Freq: Every day | SUBCUTANEOUS | Status: DC

## 2019-06-28 MED ORDER — INSULIN ASPART 100 UNIT/ML ~~LOC~~ SOLN
0.0000 [IU] | Freq: Three times a day (TID) | SUBCUTANEOUS | Status: DC
  Administered 2019-06-28: 13:00:00 2 [IU] via SUBCUTANEOUS
  Administered 2019-06-29: 5 [IU] via SUBCUTANEOUS
  Filled 2019-06-28 (×2): qty 1

## 2019-06-28 MED ORDER — METFORMIN HCL 500 MG PO TABS
1000.0000 mg | ORAL_TABLET | Freq: Two times a day (BID) | ORAL | Status: DC
  Administered 2019-06-28 – 2019-06-29 (×2): 1000 mg via ORAL
  Filled 2019-06-28 (×2): qty 2

## 2019-06-28 MED ORDER — METHYLPREDNISOLONE SODIUM SUCC 125 MG IJ SOLR
60.0000 mg | Freq: Four times a day (QID) | INTRAMUSCULAR | Status: DC
  Administered 2019-06-28 – 2019-06-29 (×4): 60 mg via INTRAVENOUS
  Filled 2019-06-28 (×4): qty 2

## 2019-06-28 MED ORDER — IPRATROPIUM-ALBUTEROL 0.5-2.5 (3) MG/3ML IN SOLN: 3.0000 mL | Freq: Four times a day (QID) | RESPIRATORY_TRACT | Status: DC | PRN

## 2019-06-28 NOTE — Progress Notes (Signed)
Inpatient Diabetes Program Recommendations  AACE/ADA: New Consensus Statement on Inpatient Glycemic Control (2015)  Target Ranges:  Prepandial:   less than 140 mg/dL      Peak postprandial:   less than 180 mg/dL (1-2 hours)      Critically ill patients:  140 - 180 mg/dL   Results for HOLLYND, WELLENS (MRN [id number]) as of 06/28/2019 14:19  Ref. Range 06/28/2019 12:28  Glucose-Capillary Latest Ref Range: 70 - 99 mg/dL 169 (H)    Admit with: Acute Respiratory Failure with Hypoxia   History: DM  Home DM Meds: Metformin 1000 mg BID  Current Orders: Novolog Sensitive Correction Scale/ SSI (0-9 units) TID AC + HS      Metformin increased to 1000 mg BID at PCP visit on 04/28/2019 (Dr. Fulton Reek with [facility])   Note Solumedrol 60 mg Q6 hours started at 1pm today.  Novolog SSi also started at 1pm today as well.  Diabetes team will follow and assist if desired by MD.     --Will follow patient during hospitalization--  Wyn Quaker RN, MSN, CDE Diabetes Coordinator Inpatient Glycemic Control Team Team Pager: [phone] (8a-5p)

## 2019-06-28 NOTE — Progress Notes (Addendum)
[place] at [place] NAME: Taylor [name]    MR#:  [id number]  DATE OF BIRTH:  [birth year]  SUBJECTIVE:  CHIEF COMPLAINT:   Chief Complaint  Patient presents with  . Shortness of Breath  feels somewhat better although still SOB and wheezing today. On 3 liters O2 REVIEW OF SYSTEMS:  Review of Systems  Constitutional: Negative for diaphoresis, fever, malaise/fatigue and weight loss.  HENT: Negative for ear discharge, ear pain, hearing loss, nosebleeds, sore throat and tinnitus.   Eyes: Negative for blurred vision and pain.  Respiratory: Positive for shortness of breath and wheezing. Negative for cough and hemoptysis.   Cardiovascular: Negative for chest pain, palpitations, orthopnea and leg swelling.  Gastrointestinal: Negative for abdominal pain, blood in stool, constipation, diarrhea, heartburn, nausea and vomiting.  Genitourinary: Negative for dysuria, frequency and urgency.  Musculoskeletal: Negative for back pain and myalgias.  Skin: Negative for itching and rash.  Neurological: Negative for dizziness, tingling, tremors, focal weakness, seizures, weakness and headaches.  Psychiatric/Behavioral: Negative for depression. The patient is not nervous/anxious.    DRUG ALLERGIES:   Allergies  Allergen Reactions  . Aspirin Hives and Swelling  . Penicillin G Hives  . Penicillins Hives   VITALS:  Blood pressure 127/63, pulse 94, temperature (!) 97.5 F (36.4 C), temperature source Oral, resp. rate 19, height 5\' 5"  (1.651 m), weight 117.9 kg, SpO2 92 %. PHYSICAL EXAMINATION:  Physical Exam HENT:     Head: Normocephalic and atraumatic.  Eyes:     Conjunctiva/sclera: Conjunctivae normal.     Pupils: Pupils are equal, round, and reactive to light.  Neck:     Thyroid: No thyromegaly.     Trachea: No tracheal deviation.  Cardiovascular:     Rate and Rhythm: Normal rate and regular rhythm.     Heart sounds: Normal heart sounds.  Pulmonary:     Effort:  Pulmonary effort is normal. No respiratory distress.     Breath sounds: Examination of the right-lower field reveals wheezing. Examination of the left-lower field reveals wheezing. Wheezing present.  Chest:     Chest wall: No tenderness.  Abdominal:     General: Bowel sounds are normal. There is no distension.     Palpations: Abdomen is soft.     Tenderness: There is no abdominal tenderness.  Musculoskeletal:        General: Normal range of motion.     Cervical back: Normal range of motion and neck supple.  Skin:    General: Skin is warm and dry.     Findings: No rash.  Neurological:     Mental Status: She is alert and oriented to person, place, and time.     Cranial Nerves: No cranial nerve deficit.    LABORATORY PANEL:  Female CBC Recent Labs  Lab 06/27/19 0306  WBC 7.0  HGB 13.2  HCT 40.7  PLT 251   ------------------------------------------------------------------------------------------------------------------ Chemistries  Recent Labs  Lab 06/27/19 0306  NA 135  K 4.6  CL 100  CO2 24  GLUCOSE 315*  BUN 12  CREATININE 0.72  0.74  CALCIUM 9.2   RADIOLOGY:  CT Chest High Resolution  Result Date: 06/28/2019 CLINICAL DATA:  Shortness of breath. Oxygen desaturation with movement and walking. EXAM: CT CHEST WITHOUT CONTRAST TECHNIQUE: Multidetector CT imaging of the chest was performed following the standard protocol without intravenous contrast. High resolution imaging of the lungs, as well as inspiratory and expiratory imaging, was performed. COMPARISON:  None. FINDINGS: Cardiovascular: Atherosclerotic  calcification of the aorta and coronary arteries. Heart size normal. No pericardial effusion. Mediastinum/Nodes: Right lobe of thyroid is asymmetrically enlarged. No pathologically enlarged mediastinal or axillary lymph nodes. Hilar regions are difficult to evaluate without IV contrast. Esophagus is grossly unremarkable. Lungs/Pleura: Negative for subpleural reticulation,  traction bronchiectasis/bronchiolectasis, ground-glass, architectural distortion or honeycombing. Minimal scarring in the medial aspects of both upper lobes. Lungs are otherwise clear. No pleural fluid. Airway is unremarkable. No air trapping. Upper Abdomen: Visualized portion of the liver is decreased in attenuation diffusely. Liver appears enlarged but is incompletely imaged. Visualized portions of the adrenal glands, spleen, pancreas and stomach are grossly unremarkable. Musculoskeletal: Degenerative changes in the spine. IMPRESSION: 1. No evidence of interstitial lung disease. No pulmonary parenchymal findings to explain the patient's symptoms. 2. Hepatic steatosis. Liver appears enlarged but is incompletely imaged. 3. Aortic atherosclerosis (ICD10-I70.0). Coronary artery calcification. Electronically Signed   By: Lorin Picket M.D.   On: 06/28/2019 11:47   ASSESSMENT AND PLAN:   Acute Respiratory Failure with Hypoxia  Desaturation occurred with movement and walking. Maintaining appropriate O2 saturations on 4 L oxygen via nasal cannula. - CXR neg. No leukocytosis. HRCT shows normal lungs  No evidence of VTE on exam or history. Suspect related to bronchospasm with recent sinus infection. May have underlying degree of COPD given smoking history.  -some wheezing today - start steroids -Duonebs q6h prn  -continue 3 liters O2 [place], wean as tolerated - will likely need O2 at D/C - TOC team aware -COVID test negative -patient may benefit from Rx for rescue inhaler at discharge, consider outpatient work up for lung disease and initiation of appropriate therapy   DM - patient refuses insulin, resume metformin  OSA  -CPAP QHS   HLD  -refuses statin  COPD Long standing smoking history - patient is hypoxic 86% on ambulation and will need 2 liters O2 at D/C   Status is: Inpatient  Remains inpatient appropriate because:Inpatient level of care appropriate due to severity of illness   Dispo:  The patient is from: Home              Anticipated d/c is to: Home              Anticipated d/c date is: 1 day              Patient currently is not medically stable to d/c.    DVT prophylaxis: Lovenox Family Communication: discussed with patient   All the records are reviewed and case discussed with Care Management/Social Worker. Management plans discussed with the patient, nursing and they are in agreement.  CODE STATUS: Full Code  TOTAL TIME TAKING CARE OF THIS PATIENT: 35 minutes.   More than 50% of the time was spent in counseling/coordination of care: YES  POSSIBLE D/C IN 1-2 DAYS, DEPENDING ON CLINICAL CONDITION.   Max Sane M.D on 06/28/2019 at 8:47 PM  [facility]   CC: Primary care physician; Idelle Crouch, MD  Note: This dictation was prepared with Dragon dictation along with smaller phrase technology. Any transcriptional errors that result from this process are unintentional.

## 2019-06-28 NOTE — Care Management (Signed)
Patient with qualifying O2 sats for home.  RNCM has reached out to [facility] with [employer] to determine if patient would qualify based on her diagnoses.

## 2019-06-29 DIAGNOSIS — E785 Hyperlipidemia, unspecified: Secondary | ICD-10-CM | POA: Diagnosis not present

## 2019-06-29 DIAGNOSIS — R0602 Shortness of breath: Secondary | ICD-10-CM | POA: Diagnosis not present

## 2019-06-29 DIAGNOSIS — J9601 Acute respiratory failure with hypoxia: Secondary | ICD-10-CM | POA: Diagnosis not present

## 2019-06-29 DIAGNOSIS — J209 Acute bronchitis, unspecified: Secondary | ICD-10-CM | POA: Diagnosis not present

## 2019-06-29 LAB — GLUCOSE, CAPILLARY
Glucose-Capillary: 374 mg/dL — ABNORMAL HIGH (ref 70–99)
Glucose-Capillary: 298 mg/dL — ABNORMAL HIGH (ref 70–99)

## 2019-06-29 MED ORDER — GUAIFENESIN-DM 100-10 MG/5ML PO SYRP: 5.0000 mL | ORAL_SOLUTION | ORAL | 0 refills | Status: DC | PRN

## 2019-06-29 NOTE — Progress Notes (Signed)
Pt discharged per MD order. IV removed. Discharge instructions reviewed with pt. Pt verbalized understanding. Pt placed on home O2 and wheeled to car in wheelchair.

## 2019-06-29 NOTE — Progress Notes (Signed)
SATURATION QUALIFICATIONS: (This note is used to comply with regulatory documentation for home oxygen)  Patient Saturations on Room Air at Rest = 91%  Patient Saturations on Room Air while Ambulating = 86%  Patient Saturations on 2 Liters of oxygen while Ambulating = 91%  Please briefly explain why patient needs home oxygen: Patient decreases oxygen saturation while ambulating on room air. Patient needs 2L of oxygen to maintain an O2 saturation of 91%.

## 2019-06-29 NOTE — Care Management (Signed)
Patient to discharge today with home O2 Referral made to [facility] with [place] Portable O2 delivered prior to discharge.   Per MD and bedside RN home health RN is not indicated at discharge

## 2019-07-01 NOTE — Discharge Summary (Signed)
[place] at [place] NAME: Taylor [name]    MR#:  [id number]  DATE OF BIRTH:  1961/02/14  DATE OF ADMISSION:  06/26/2019   ADMITTING PHYSICIAN: Max Sane, MD  DATE OF DISCHARGE: 06/29/2019 12:13 PM  PRIMARY CARE PHYSICIAN: Idelle Crouch, MD   ADMISSION DIAGNOSIS:  Shortness of breath [R06.02] Hypoxia [R09.02] Acute respiratory failure with hypoxia (HCC) [J96.01] Bronchospasm with bronchitis, acute [J20.9] DISCHARGE DIAGNOSIS:  Active Problems:   Hypoxia   Hyperlipidemia   Sinusitis   Acute respiratory failure with hypoxia (HCC)   OSA (obstructive sleep apnea)   Bronchospasm with bronchitis, acute  SECONDARY DIAGNOSIS:   Past Medical History:  Diagnosis Date  . Depression   . High cholesterol   . Hypertension    HOSPITAL COURSE:   Acute Respiratory Failure with Hypoxia  Desaturation occurred with movement and walking. Patient qualified for 2 liters O2 at D/C -CXR neg. No leukocytosis. HRCT shows normal lungs - No evidence of VTE on exam or history. Suspect related to bronchospasm with recent sinus infection.   -COVID testnegative  DM -  metformin  OSA  -CPAP QHS   HLD  - statin  COPD Long standing smoking history - patient is hypoxic 86% on ambulation and will need 2 liters O2 at D/C  DISCHARGE CONDITIONS:  stable CONSULTS OBTAINED:   DRUG ALLERGIES:   Allergies  Allergen Reactions  . Aspirin Hives and Swelling  . Penicillin G Hives  . Penicillins Hives   DISCHARGE MEDICATIONS:   Allergies as of 06/29/2019      Reactions   Aspirin Hives, Swelling   Penicillin G Hives   Penicillins Hives      Medication List    TAKE these medications   albuterol 108 (90 Base) MCG/ACT inhaler Commonly known as: Proventil HFA Inhale 2 puffs into the lungs every 4 (four) hours as needed for wheezing or shortness of breath.   amitriptyline 10 MG tablet Commonly known as: ELAVIL Take 10 mg by mouth at bedtime.     atorvastatin 40 MG tablet Commonly known as: LIPITOR Take 40 mg by mouth daily.   bisoprolol-hydrochlorothiazide 10-6.25 MG tablet Commonly known as: ZIAC Take 1 tablet by mouth daily.   celecoxib 100 MG capsule Commonly known as: CELEBREX Take 100 mg by mouth 2 (two) times daily.   clonazePAM 0.5 MG tablet Commonly known as: KLONOPIN Take 0.5 mg by mouth 2 (two) times daily.   Cosentyx Sensoready (300 MG) 150 MG/ML [place] Generic drug: Secukinumab (300 MG Dose) Inject 2 Syringes into the skin every 28 (twenty-eight) days.   escitalopram 20 MG tablet Commonly known as: LEXAPRO Take 20 mg by mouth daily.   folic acid 1 MG tablet Commonly known as: FOLVITE Take 1 mg by mouth daily.   guaiFENesin 600 MG 12 hr tablet Commonly known as: MUCINEX Take 600 mg by mouth 2 (two) times daily as needed.   guaiFENesin-dextromethorphan 100-10 MG/5ML syrup Commonly known as: ROBITUSSIN DM Take 5 mLs by mouth every 4 (four) hours as needed for cough (chest congestion).   metFORMIN 1000 MG tablet Commonly known as: GLUCOPHAGE Take 1,000 mg by mouth 2 (two) times daily.   methotrexate 2.5 MG tablet Commonly known as: RHEUMATREX Take 15 mg by mouth once a week.   omeprazole 20 MG capsule Commonly known as: PRILOSEC Take 20 mg by mouth daily.   predniSONE 20 MG tablet Commonly known as: Deltasone Take 2 tablets (40 mg total) by mouth daily.  sulfamethoxazole-trimethoprim 800-160 MG tablet Commonly known as: BACTRIM DS Take 1 tablet by mouth 2 (two) times daily.   vitamin B-12 1000 MCG tablet Commonly known as: CYANOCOBALAMIN Take 1,000 mcg by mouth every 30 (thirty) days.   cyanocobalamin 1000 MCG tablet Take 1,000 mcg by mouth daily.      DISCHARGE INSTRUCTIONS:   DIET:  Regular diet DISCHARGE CONDITION:  Stable ACTIVITY:  Activity as tolerated OXYGEN:  Home Oxygen: Yes.    Oxygen Delivery: 2 liters/min via Patient connected to nasal cannula oxygen DISCHARGE  LOCATION:  home   If you experience worsening of your admission symptoms, develop shortness of breath, life threatening emergency, suicidal or homicidal thoughts you must seek medical attention immediately by calling 911 or calling your MD immediately  if symptoms less severe.  You Must read complete instructions/literature along with all the possible adverse reactions/side effects for all the Medicines you take and that have been prescribed to you. Take any new Medicines after you have completely understood and accpet all the possible adverse reactions/side effects.   Please note  You were cared for by a hospitalist during your hospital stay. If you have any questions about your discharge medications or the care you received while you were in the hospital after you are discharged, you can call the unit and asked to speak with the hospitalist on call if the hospitalist that took care of you is not available. Once you are discharged, your primary care physician will handle any further medical issues. Please note that NO REFILLS for any discharge medications will be authorized once you are discharged, as it is imperative that you return to your primary care physician (or establish a relationship with a primary care physician if you do not have one) for your aftercare needs so that they can reassess your need for medications and monitor your lab values.    On the day of Discharge:  VITAL SIGNS:  Blood pressure 138/88, pulse 96, temperature (!) 97.3 F (36.3 C), temperature source Oral, resp. rate 20, height 5\' 5"  (1.651 m), weight 117.9 kg, SpO2 91 %. PHYSICAL EXAMINATION:  GENERAL:  59 y.o.-year-old patient lying in the bed with no acute distress.  EYES: Pupils equal, round, reactive to light and accommodation. No scleral icterus. Extraocular muscles intact.  HEENT: Head atraumatic, normocephalic. Oropharynx and nasopharynx clear.  NECK:  Supple, no jugular venous distention. No thyroid  enlargement, no tenderness.  LUNGS: Normal breath sounds bilaterally, no wheezing, rales,rhonchi or crepitation. No use of accessory muscles of respiration.  CARDIOVASCULAR: S1, S2 normal. No murmurs, rubs, or gallops.  ABDOMEN: Soft, non-tender, non-distended. Bowel sounds present. No organomegaly or mass.  EXTREMITIES: No pedal edema, cyanosis, or clubbing.  NEUROLOGIC: Cranial nerves II through XII are intact. Muscle strength 5/5 in all extremities. Sensation intact. Gait not checked.  PSYCHIATRIC: The patient is alert and oriented x 3.  SKIN: No obvious rash, lesion, or ulcer.  DATA REVIEW:   CBC Recent Labs  Lab 06/27/19 0306  WBC 7.0  HGB 13.2  HCT 40.7  PLT 251    Chemistries  Recent Labs  Lab 06/27/19 0306  NA 135  K 4.6  CL 100  CO2 24  GLUCOSE 315*  BUN 12  CREATININE 0.72  0.74  CALCIUM 9.2     Outpatient follow-up Follow-up Information    Allyne Gee, MD. Go on 07/08/2019.   Specialties: Internal Medicine, Pulmonary Disease Why: 10:45 Contact information: [address] [place] [address] [phone]  Idelle Crouch, MD In 2 days.   Specialty: Internal Medicine Why: they will call patient with appointment. Contact information: [place] [address] [phone]            Management plans discussed with the patient, family and they are in agreement.  CODE STATUS: Prior   TOTAL TIME TAKING CARE OF THIS PATIENT: 45 minutes.    Max Sane M.D on 07/01/2019 at 9:30 AM  [facility]   CC: Primary care physician; Idelle Crouch, MD   Note: This dictation was prepared with Dragon dictation along with smaller phrase technology. Any transcriptional errors that result from this process are unintentional.

## 2019-07-06 ENCOUNTER — Telehealth: Payer: Self-pay

## 2019-07-06 NOTE — Telephone Encounter (Signed)
Patient cancelled appointment on 07/08/2019. klh

## 2019-07-08 ENCOUNTER — Ambulatory Visit: Payer: Self-pay | Admitting: Internal Medicine

## 2019-08-05 ENCOUNTER — Other Ambulatory Visit: Payer: Self-pay | Admitting: Internal Medicine

## 2019-08-05 DIAGNOSIS — Z1231 Encounter for screening mammogram for malignant neoplasm of breast: Secondary | ICD-10-CM

## 2019-09-01 DIAGNOSIS — I5189 Other ill-defined heart diseases: Secondary | ICD-10-CM

## 2019-09-01 HISTORY — DX: Other ill-defined heart diseases: I51.89

## 2019-09-09 DIAGNOSIS — I6523 Occlusion and stenosis of bilateral carotid arteries: Secondary | ICD-10-CM | POA: Insufficient documentation

## 2019-10-01 ENCOUNTER — Ambulatory Visit
Admission: RE | Admit: 2019-10-01 | Discharge: 2019-10-01 | Disposition: A | Discharge status: Home / Self Care | Payer: Medicare Other | Source: Ambulatory Visit | Attending: Internal Medicine | Admitting: Internal Medicine

## 2019-10-01 DIAGNOSIS — Z1231 Encounter for screening mammogram for malignant neoplasm of breast: Secondary | ICD-10-CM | POA: Diagnosis present

## 2019-11-24 DIAGNOSIS — E039 Hypothyroidism, unspecified: Secondary | ICD-10-CM | POA: Insufficient documentation

## 2020-03-20 DIAGNOSIS — I2510 Atherosclerotic heart disease of native coronary artery without angina pectoris: Secondary | ICD-10-CM | POA: Insufficient documentation

## 2020-08-21 ENCOUNTER — Other Ambulatory Visit: Payer: Self-pay | Admitting: Internal Medicine

## 2020-08-21 DIAGNOSIS — Z1231 Encounter for screening mammogram for malignant neoplasm of breast: Secondary | ICD-10-CM

## 2020-10-02 ENCOUNTER — Ambulatory Visit
Admission: RE | Admit: 2020-10-02 | Discharge: 2020-10-02 | Disposition: A | Discharge status: Home / Self Care | Payer: Medicare Other | Source: Ambulatory Visit | Attending: Internal Medicine | Admitting: Internal Medicine

## 2020-10-02 ENCOUNTER — Other Ambulatory Visit: Payer: Self-pay

## 2020-10-02 DIAGNOSIS — Z1231 Encounter for screening mammogram for malignant neoplasm of breast: Secondary | ICD-10-CM | POA: Insufficient documentation

## 2021-01-01 DIAGNOSIS — D472 Monoclonal gammopathy: Secondary | ICD-10-CM

## 2021-01-01 HISTORY — DX: Monoclonal gammopathy: D47.2

## 2021-02-05 ENCOUNTER — Encounter: Payer: Self-pay | Admitting: Oncology

## 2021-02-05 ENCOUNTER — Inpatient Hospital Stay: Payer: Medicare Other | Attending: Oncology | Admitting: Oncology

## 2021-02-05 ENCOUNTER — Other Ambulatory Visit: Payer: Self-pay

## 2021-02-05 ENCOUNTER — Inpatient Hospital Stay: Payer: Medicare Other

## 2021-02-05 VITALS — BP 133/44 | HR 64 | Temp 97.6°F | Resp 18 | Wt 251.9 lb

## 2021-02-05 DIAGNOSIS — Z7984 Long term (current) use of oral hypoglycemic drugs: Secondary | ICD-10-CM | POA: Diagnosis not present

## 2021-02-05 DIAGNOSIS — G629 Polyneuropathy, unspecified: Secondary | ICD-10-CM | POA: Diagnosis not present

## 2021-02-05 DIAGNOSIS — L4050 Arthropathic psoriasis, unspecified: Secondary | ICD-10-CM | POA: Diagnosis not present

## 2021-02-05 DIAGNOSIS — I10 Essential (primary) hypertension: Secondary | ICD-10-CM | POA: Insufficient documentation

## 2021-02-05 DIAGNOSIS — E785 Hyperlipidemia, unspecified: Secondary | ICD-10-CM | POA: Insufficient documentation

## 2021-02-05 DIAGNOSIS — E119 Type 2 diabetes mellitus without complications: Secondary | ICD-10-CM | POA: Diagnosis not present

## 2021-02-05 DIAGNOSIS — D472 Monoclonal gammopathy: Secondary | ICD-10-CM | POA: Insufficient documentation

## 2021-02-05 DIAGNOSIS — E7800 Pure hypercholesterolemia, unspecified: Secondary | ICD-10-CM | POA: Diagnosis not present

## 2021-02-05 DIAGNOSIS — G4730 Sleep apnea, unspecified: Secondary | ICD-10-CM | POA: Diagnosis not present

## 2021-02-05 DIAGNOSIS — Z79899 Other long term (current) drug therapy: Secondary | ICD-10-CM | POA: Diagnosis not present

## 2021-02-05 LAB — COMPREHENSIVE METABOLIC PANEL
ALT: 30 U/L (ref 0–44)
AST: 40 U/L (ref 15–41)
Albumin: 4.4 g/dL (ref 3.5–5.0)
Alkaline Phosphatase: 66 U/L (ref 38–126)
Anion gap: 8 (ref 5–15)
BUN: 13 mg/dL (ref 8–23)
CO2: 30 mmol/L (ref 22–32)
Calcium: 8.7 mg/dL — ABNORMAL LOW (ref 8.9–10.3)
Chloride: 104 mmol/L (ref 98–111)
Creatinine, Ser: 0.87 mg/dL (ref 0.44–1.00)
GFR, Estimated: >60 mL/min (ref >60)
Glucose, Bld: 136 mg/dL — ABNORMAL HIGH (ref 70–99)
Potassium: 4.2 mmol/L (ref 3.5–5.1)
Sodium: 142 mmol/L (ref 135–145)
Total Bilirubin: 0.5 mg/dL (ref 0.3–1.2)
Total Protein: 7.5 g/dL (ref 6.5–8.1)

## 2021-02-05 LAB — CBC WITH DIFFERENTIAL/PLATELET
Abs Immature Granulocytes: 0.06 10*3/uL (ref 0.00–0.07)
Basophils Absolute: 0 10*3/uL (ref 0.0–0.1)
Basophils Relative: 1 %
Eosinophils Absolute: 0.5 10*3/uL (ref 0.0–0.5)
Eosinophils Relative: 7 %
HCT: 43.7 % (ref 36.0–46.0)
Hemoglobin: 14.4 g/dL (ref 12.0–15.0)
Immature Granulocytes: 1 %
Lymphocytes Relative: 21 %
Lymphs Abs: 1.6 10*3/uL (ref 0.7–4.0)
MCH: 33.2 pg (ref 26.0–34.0)
MCHC: 33 g/dL (ref 30.0–36.0)
MCV: 100.7 fL — ABNORMAL HIGH (ref 80.0–100.0)
Monocytes Absolute: 0.5 10*3/uL (ref 0.1–1.0)
Monocytes Relative: 6 %
Neutro Abs: 5.1 10*3/uL (ref 1.7–7.7)
Neutrophils Relative %: 64 %
Platelets: 182 10*3/uL (ref 150–400)
RBC: 4.34 MIL/uL (ref 3.87–5.11)
RDW: 14.6 % (ref 11.5–15.5)
WBC: 7.8 10*3/uL (ref 4.0–10.5)
nRBC: 0 % (ref 0.0–0.2)

## 2021-02-05 NOTE — Progress Notes (Signed)
Hematology/Oncology Consult note [facility] [phone] [phone]  Patient Care Team: Idelle Crouch, MD as PCP - General (Internal Medicine) Doy Hutching Leonie Douglas, MD (Internal Medicine)   Name of the patient: Taylor [name]  [id number]  04/12/1960    Reason for referral-abnormal SPEP   Referring physician-Dr. Manuella Ghazi  Date of visit: 02/05/21   History of presenting illness- Patient is a 60 year old female with a past medical history significant for psoriasis and psoriatic arthritis, type 2 diabetes was seen by neurology for evaluation of neuropathy.  As a part of the work-up she had SPEP done which showed IgG lambda monoclonal protein of 0.3 g.  Patient has had mildly elevated calcium in the past but most recent calcium was 9.3.  H&H normal at 14.6/44.3.  Renal functions and total protein normal.  ECOG PS- 1  Pain scale- 3   Review of systems- Review of Systems  Constitutional:  Positive for malaise/fatigue. Negative for chills, fever and weight loss.  HENT:  Negative for congestion, ear discharge and nosebleeds.   Eyes:  Negative for blurred vision.  Respiratory:  Negative for cough, hemoptysis, sputum production, shortness of breath and wheezing.   Cardiovascular:  Negative for chest pain, palpitations, orthopnea and claudication.  Gastrointestinal:  Negative for abdominal pain, blood in stool, constipation, diarrhea, heartburn, melena, nausea and vomiting.  Genitourinary:  Negative for dysuria, flank pain, frequency, hematuria and urgency.  Musculoskeletal:  Negative for back pain, joint pain and myalgias.  Skin:  Negative for rash.  Neurological:  Positive for sensory change (Peripheral neuropathy). Negative for dizziness, tingling, focal weakness, seizures, weakness and headaches.  Endo/Heme/Allergies:  Does not bruise/bleed easily.  Psychiatric/Behavioral:  Negative for depression and suicidal ideas. The patient does not have  insomnia.    Allergies  Allergen Reactions   Aspirin Hives and Swelling   Penicillin G Hives   Penicillins Hives    Patient Active Problem List   Diagnosis Date Noted   Hypoxia 06/27/2019   Hyperlipidemia 06/27/2019   Sinusitis 06/27/2019   OSA (obstructive sleep apnea) 06/27/2019   Acute respiratory failure with hypoxia (HCC)    Shortness of breath    Bronchospasm with bronchitis, acute      Past Medical History:  Diagnosis Date   Depression    High cholesterol    Hypertension      Past Surgical History:  Procedure Laterality Date   ABDOMINAL HYSTERECTOMY     BREAST BIOPSY Left 08-23-2013   stereo, negative    Social History   Socioeconomic History   Marital status: Single    Spouse name: Not on file   Number of children: Not on file   Years of education: Not on file   Highest education level: Not on file  Occupational History   Not on file  Tobacco Use   Smoking status: Every Day   Smokeless tobacco: Never  Substance and Sexual Activity   Alcohol use: Yes   Drug use: No   Sexual activity: Not on file  Other Topics Concern   Not on file  Social History Narrative   Not on file   Social Determinants of Health   Financial Resource Strain: Not on file  Food Insecurity: Not on file  Transportation Needs: Not on file  Physical Activity: Not on file  Stress: Not on file  Social Connections: Not on file  Intimate Partner Violence: Not on file     Family History  Problem Relation Age of Onset  Breast cancer Mother 64     Current Outpatient Medications:    bisoprolol-hydrochlorothiazide (ZIAC) 10-6.25 MG tablet, Take 1 tablet by mouth daily., Disp: , Rfl:    escitalopram (LEXAPRO) 20 MG tablet, Take 20 mg by mouth daily., Disp: , Rfl:    glimepiride (AMARYL) 4 MG tablet, Take 4 mg by mouth daily., Disp: , Rfl:    levothyroxine (SYNTHROID) 50 MCG tablet, Take by mouth., Disp: , Rfl:    pregabalin (LYRICA) 50 MG capsule, Take by mouth., Disp: ,  Rfl:    rosuvastatin (CRESTOR) 40 MG tablet, Take 40 mg by mouth daily., Disp: , Rfl:    TALTZ 80 MG/ML SOAJ, Inject into the skin., Disp: , Rfl:    topiramate (TOPAMAX) 50 MG tablet, Take 50 mg by mouth 2 (two) times daily., Disp: , Rfl:    vitamin B-12 (CYANOCOBALAMIN) 1000 MCG tablet, Take 1,000 mcg by mouth every 30 (thirty) days. , Disp: , Rfl:    albuterol (PROVENTIL HFA) 108 (90 Base) MCG/ACT inhaler, Inhale 2 puffs into the lungs every 4 (four) hours as needed for wheezing or shortness of breath. (Patient not taking: Reported on 02/05/2021), Disp: 18 g, Rfl: 0   amitriptyline (ELAVIL) 10 MG tablet, Take 10 mg by mouth at bedtime. (Patient not taking: Reported on 02/05/2021), Disp: , Rfl:    atorvastatin (LIPITOR) 40 MG tablet, Take 40 mg by mouth daily. (Patient not taking: Reported on 02/05/2021), Disp: , Rfl:    celecoxib (CELEBREX) 100 MG capsule, Take 100 mg by mouth 2 (two) times daily. (Patient not taking: Reported on 02/05/2021), Disp: , Rfl:    clonazePAM (KLONOPIN) 0.5 MG tablet, Take 0.5 mg by mouth 2 (two) times daily. (Patient not taking: Reported on 02/05/2021), Disp: , Rfl:    clopidogrel (PLAVIX) 75 MG tablet, Take 75 mg by mouth daily., Disp: , Rfl:    COSENTYX SENSOREADY, 300 MG, 150 MG/ML SOAJ, Inject 2 Syringes into the skin every 28 (twenty-eight) days. (Patient not taking: Reported on 02/05/2021), Disp: , Rfl:    guaiFENesin (MUCINEX) 600 MG 12 hr tablet, Take 600 mg by mouth 2 (two) times daily as needed. (Patient not taking: Reported on 02/05/2021), Disp: , Rfl:    guaiFENesin-dextromethorphan (ROBITUSSIN DM) 100-10 MG/5ML syrup, Take 5 mLs by mouth every 4 (four) hours as needed for cough (chest congestion). (Patient not taking: Reported on 02/05/2021), Disp: 118 mL, Rfl: 0   metFORMIN (GLUCOPHAGE) 1000 MG tablet, Take 1,000 mg by mouth 2 (two) times daily. (Patient not taking: Reported on 02/05/2021), Disp: , Rfl:    methotrexate (RHEUMATREX) 2.5 MG tablet, Take 15 mg  by mouth once a week. (Patient not taking: Reported on 02/05/2021), Disp: , Rfl:    omeprazole (PRILOSEC) 20 MG capsule, Take 20 mg by mouth daily. (Patient not taking: Reported on 02/05/2021), Disp: , Rfl:    predniSONE (DELTASONE) 20 MG tablet, Take 2 tablets (40 mg total) by mouth daily. (Patient not taking: Reported on 02/05/2021), Disp: 8 tablet, Rfl: 0   sulfamethoxazole-trimethoprim (BACTRIM DS) 800-160 MG tablet, Take 1 tablet by mouth 2 (two) times daily. (Patient not taking: Reported on 02/05/2021), Disp: , Rfl:    Physical exam:  Vitals:   02/05/21 1132  BP: (!) 133/44  Pulse: 64  Resp: 18  Temp: 97.6 F (36.4 C)  TempSrc: Tympanic  SpO2: 99%  Weight: 251 lb 14.4 oz (114.3 kg)   Physical Exam Constitutional:      General: She is not in acute distress. Cardiovascular:  Rate and Rhythm: Normal rate and regular rhythm.     Heart sounds: Normal heart sounds.  Pulmonary:     Effort: Pulmonary effort is normal.     Breath sounds: Normal breath sounds.  Abdominal:     General: Bowel sounds are normal.     Palpations: Abdomen is soft.  Skin:    General: Skin is warm and dry.  Neurological:     Mental Status: She is alert and oriented to person, place, and time.       CMP Latest Ref Rng & Units 02/05/2021  Glucose 70 - 99 mg/dL 136(H)  BUN 8 - 23 mg/dL 13  Creatinine 0.44 - 1.00 mg/dL 0.87  Sodium 135 - 145 mmol/L 142  Potassium 3.5 - 5.1 mmol/L 4.2  Chloride 98 - 111 mmol/L 104  CO2 22 - 32 mmol/L 30  Calcium 8.9 - 10.3 mg/dL 8.7(L)  Total Protein 6.5 - 8.1 g/dL 7.5  Total Bilirubin 0.3 - 1.2 mg/dL 0.5  Alkaline Phos 38 - 126 U/L 66  AST 15 - 41 U/L 40  ALT 0 - 44 U/L 30   CBC Latest Ref Rng & Units 02/05/2021  WBC 4.0 - 10.5 K/uL 7.8  Hemoglobin 12.0 - 15.0 g/dL 14.4  Hematocrit 36.0 - 46.0 % 43.7  Platelets 150 - 400 K/uL 182    Assessment and plan- Patient is a 60 y.o. female referred for abnormal SPEP  Patient is a small amount of 0.3 IgG lambda  monoclonal protein noted on SPEP.  No evidence of anemia, AKI or hypercalcemia.  Total protein is normal.  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  Discussed differences between MGUS and multiple myeloma.  I do not feel that her MGUS is contributing to her neuropathy at this time.  I will check CBC with differential CMP myeloma panel and serum free light chains today.I will see her back in about 3 to 4 weeks time to discuss the results of blood work and further management   Thank you for this kind referral and the opportunity to participate in the care of this patient   Visit Diagnosis 1. MGUS (monoclonal gammopathy of unknown significance)     Dr. Randa Evens, MD, MPH [facility] [phone] 02/05/2021

## 2021-02-06 LAB — PROTEIN ELECTRO, RANDOM URINE
Albumin ELP, Urine: 37.4 %
Alpha-1-Globulin, U: 6.3 %
Alpha-2-Globulin, U: 14.9 %
Beta Globulin, U: 26.2 %
Gamma Globulin, U: 15.2 %
Total Protein, Urine: 33.4 mg/dL

## 2021-02-06 LAB — KAPPA/LAMBDA LIGHT CHAINS
Kappa free light chain: 19.3 mg/L (ref 3.3–19.4)
Kappa, lambda light chain ratio: 1.25 (ref 0.26–1.65)
Lambda free light chains: 15.4 mg/L (ref 5.7–26.3)

## 2021-02-09 LAB — MULTIPLE MYELOMA PANEL, SERUM
Albumin SerPl Elph-Mcnc: 3.7 g/dL (ref 2.9–4.4)
Albumin/Glob SerPl: 1.3 (ref 0.7–1.7)
Alpha 1: 0.2 g/dL (ref 0.0–0.4)
Alpha2 Glob SerPl Elph-Mcnc: 0.8 g/dL (ref 0.4–1.0)
B-Globulin SerPl Elph-Mcnc: 1.1 g/dL (ref 0.7–1.3)
Gamma Glob SerPl Elph-Mcnc: 0.8 g/dL (ref 0.4–1.8)
Globulin, Total: 3 g/dL (ref 2.2–3.9)
IgA: 145 mg/dL (ref 87–352)
IgG (Immunoglobin G), Serum: 862 mg/dL (ref 586–1602)
IgM (Immunoglobulin M), Srm: 78 mg/dL (ref 26–217)
Total Protein ELP: 6.7 g/dL (ref 6.0–8.5)

## 2021-02-27 ENCOUNTER — Other Ambulatory Visit: Payer: Self-pay

## 2021-02-27 ENCOUNTER — Ambulatory Visit
Admission: RE | Admit: 2021-02-27 | Discharge: 2021-02-27 | Disposition: A | Discharge status: Home / Self Care | Payer: Medicare Other | Source: Ambulatory Visit | Attending: Physician Assistant | Admitting: Physician Assistant

## 2021-02-27 ENCOUNTER — Other Ambulatory Visit: Payer: Self-pay | Admitting: Physician Assistant

## 2021-02-27 DIAGNOSIS — M7989 Other specified soft tissue disorders: Secondary | ICD-10-CM | POA: Diagnosis not present

## 2021-03-07 ENCOUNTER — Other Ambulatory Visit: Payer: Self-pay | Admitting: Sports Medicine

## 2021-03-07 DIAGNOSIS — S8992XA Unspecified injury of left lower leg, initial encounter: Secondary | ICD-10-CM

## 2021-03-07 DIAGNOSIS — M25462 Effusion, left knee: Secondary | ICD-10-CM

## 2021-03-07 DIAGNOSIS — M25562 Pain in left knee: Secondary | ICD-10-CM

## 2021-03-07 DIAGNOSIS — M7122 Synovial cyst of popliteal space [Baker], left knee: Secondary | ICD-10-CM

## 2021-03-16 ENCOUNTER — Other Ambulatory Visit: Payer: Self-pay

## 2021-03-16 ENCOUNTER — Encounter: Payer: Self-pay | Admitting: Oncology

## 2021-03-16 ENCOUNTER — Inpatient Hospital Stay: Payer: Medicare Other | Attending: Oncology | Admitting: Oncology

## 2021-03-16 VITALS — BP 114/71 | HR 55 | Temp 98.8°F | Resp 16 | Wt 255.4 lb

## 2021-03-16 DIAGNOSIS — F329 Major depressive disorder, single episode, unspecified: Secondary | ICD-10-CM | POA: Diagnosis not present

## 2021-03-16 DIAGNOSIS — F17210 Nicotine dependence, cigarettes, uncomplicated: Secondary | ICD-10-CM | POA: Diagnosis not present

## 2021-03-16 DIAGNOSIS — Z7984 Long term (current) use of oral hypoglycemic drugs: Secondary | ICD-10-CM | POA: Diagnosis not present

## 2021-03-16 DIAGNOSIS — Z79899 Other long term (current) drug therapy: Secondary | ICD-10-CM | POA: Diagnosis not present

## 2021-03-16 DIAGNOSIS — D472 Monoclonal gammopathy: Secondary | ICD-10-CM | POA: Diagnosis present

## 2021-03-16 DIAGNOSIS — E7800 Pure hypercholesterolemia, unspecified: Secondary | ICD-10-CM | POA: Insufficient documentation

## 2021-03-16 DIAGNOSIS — G629 Polyneuropathy, unspecified: Secondary | ICD-10-CM | POA: Diagnosis not present

## 2021-03-16 DIAGNOSIS — I10 Essential (primary) hypertension: Secondary | ICD-10-CM | POA: Insufficient documentation

## 2021-03-16 NOTE — Progress Notes (Signed)
Patient here for follow up she reports that she has recently injured her left knee and is scheduled to see an ortho.

## 2021-03-16 NOTE — Progress Notes (Signed)
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  As a part of the work-up she had SPEP done which showed IgG lambda monoclonal protein of 0.3 g.  Patient has had mildly elevated calcium in the past but most recent calcium was 9.3.  H&H normal at 14.6/44.3.  Renal functions and total protein normal.   Results of blood work from 02/05/2021 were as follows: CBC showed normal white count hemoglobin and platelets with an MCV of 100.7.  CMP was normal.  Myeloma panel showed no M protein on SPEP but immunofixation showed IgG lambda monoclonal protein.  Serum free light chain ratio normal at 1.25  Interval history- no acute issues since last visit.  ECOG PS- 1 Pain scale- 0   Review of systems- Review of Systems  Constitutional:  Negative for chills, fever, malaise/fatigue and weight loss.  HENT:  Negative for congestion, ear discharge and nosebleeds.   Eyes:  Negative for blurred vision.  Respiratory:  Negative for cough, hemoptysis, sputum production, shortness of breath and wheezing.   Cardiovascular:  Negative for chest pain, palpitations, orthopnea and claudication.  Gastrointestinal:  Negative for abdominal pain, blood in stool, constipation, diarrhea, heartburn, melena, nausea and vomiting.  Genitourinary:  Negative for dysuria, flank pain, frequency, hematuria and urgency.  Musculoskeletal:  Negative for back pain,  joint pain and myalgias.  Skin:  Negative for rash.  Neurological:  Negative for dizziness, tingling, focal weakness, seizures, weakness and headaches.  Endo/Heme/Allergies:  Does not bruise/bleed easily.  Psychiatric/Behavioral:  Negative for depression and suicidal ideas. The patient does not have insomnia.      Allergies  Allergen Reactions   Aspirin Hives and Swelling   Penicillin G Hives   Penicillins Hives     Past Medical History:  Diagnosis Date   Depression    High cholesterol    Hypertension      Past Surgical History:  Procedure Laterality Date   ABDOMINAL HYSTERECTOMY     BREAST BIOPSY Left 08-23-2013   stereo, negative    Social History   Socioeconomic History   Marital status: Single    Spouse name: Not on file   Number of children: Not on file   Years of education: Not on file   Highest education level: Not on file  Occupational History   Not on file  Tobacco Use   Smoking status: Every Day   Smokeless tobacco: Never  Substance and Sexual Activity   Alcohol use: Yes   Drug use: No   Sexual activity: Not on file  Other Topics Concern   Not on file  Social History Narrative   Not on file   Social Determinants of Health   Financial Resource Strain: Not on file  Food Insecurity: Not on file  Transportation Needs: Not on file  Physical Activity: Not on file  Stress: Not on file  Social Connections: Not on file  Intimate Partner Violence: Not on file    Family History  Problem  Relation Age of Onset   Breast cancer Mother 76     Current Outpatient Medications:    albuterol (PROVENTIL HFA) 108 (90 Base) MCG/ACT inhaler, Inhale 2 puffs into the lungs every 4 (four) hours as needed for wheezing or shortness of breath., Disp: 18 g, Rfl: 0   amitriptyline (ELAVIL) 10 MG tablet, Take 10 mg by mouth at bedtime., Disp: , Rfl:    celecoxib (CELEBREX) 100 MG capsule, Take 100 mg by mouth 2 (two) times daily., Disp: , Rfl:    clonazePAM (KLONOPIN)  0.5 MG tablet, Take 0.5 mg by mouth 2 (two) times daily., Disp: , Rfl:    clopidogrel (PLAVIX) 75 MG tablet, Take 75 mg by mouth daily., Disp: , Rfl:    escitalopram (LEXAPRO) 20 MG tablet, Take 20 mg by mouth daily., Disp: , Rfl:    glimepiride (AMARYL) 4 MG tablet, Take 4 mg by mouth daily., Disp: , Rfl:    guaiFENesin (MUCINEX) 600 MG 12 hr tablet, Take 600 mg by mouth 2 (two) times daily as needed., Disp: , Rfl:    levothyroxine (SYNTHROID) 50 MCG tablet, Take by mouth., Disp: , Rfl:    omeprazole (PRILOSEC) 20 MG capsule, Take 20 mg by mouth daily., Disp: , Rfl:    pregabalin (LYRICA) 50 MG capsule, Take by mouth., Disp: , Rfl:    rosuvastatin (CRESTOR) 40 MG tablet, Take 40 mg by mouth daily., Disp: , Rfl:    TALTZ 80 MG/ML SOAJ, Inject into the skin., Disp: , Rfl:    topiramate (TOPAMAX) 50 MG tablet, Take 50 mg by mouth 2 (two) times daily., Disp: , Rfl:    vitamin B-12 (CYANOCOBALAMIN) 1000 MCG tablet, Take 1,000 mcg by mouth every 30 (thirty) days. , Disp: , Rfl:    bisoprolol-hydrochlorothiazide (ZIAC) 10-6.25 MG tablet, Take 1 tablet by mouth daily., Disp: , Rfl:    COSENTYX SENSOREADY, 300 MG, 150 MG/ML SOAJ, Inject 2 Syringes into the skin every 28 (twenty-eight) days. (Patient not taking: Reported on 02/05/2021), Disp: , Rfl:   Physical exam:  Vitals:   03/16/21 1007  BP: 114/71  Pulse: (!) 55  Resp: 16  Temp: 98.8 F (37.1 C)  TempSrc: Tympanic  SpO2: 98%  Weight: 255 lb 6.4 oz (115.8 kg)   Physical Exam Constitutional:      General: She is not in acute distress. Cardiovascular:     Rate and Rhythm: Normal rate and regular rhythm.     Heart sounds: Normal heart sounds.  Pulmonary:     Effort: Pulmonary effort is normal.  Skin:    General: Skin is warm and dry.  Neurological:     Mental Status: She is alert and oriented to person, place, and time.     CMP Latest Ref Rng & Units 02/05/2021  Glucose 70 - 99 mg/dL 136(H)  BUN 8 - 23 mg/dL 13  Creatinine 0.44 -  1.00 mg/dL 0.87  Sodium 135 - 145 mmol/L 142  Potassium 3.5 - 5.1 mmol/L 4.2  Chloride 98 - 111 mmol/L 104  CO2 22 - 32 mmol/L 30  Calcium 8.9 - 10.3 mg/dL 8.7(L)  Total Protein 6.5 - 8.1 g/dL 7.5  Total Bilirubin 0.3 - 1.2 mg/dL 0.5  Alkaline Phos 38 - 126 U/L 66  AST 15 - 41 U/L 40  ALT 0 - 44 U/L 30   CBC Latest Ref Rng & Units 02/05/2021  WBC 4.0 - 10.5 K/uL 7.8  Hemoglobin 12.0 - 15.0 g/dL 14.4  Hematocrit 36.0 -  46.0 % 43.7  Platelets 150 - 400 K/uL 182    No images are attached to the encounter.  US Venous Img Lower Unilateral Left (DVT)  Result Date: 02/27/2021 CLINICAL DATA:  Left leg pain and edema EXAM: LEFT LOWER EXTREMITY VENOUS DOPPLER ULTRASOUND TECHNIQUE: Gray-scale sonography with graded compression, as well as color Doppler and duplex ultrasound were performed to evaluate the lower extremity deep venous systems from the level of the common femoral vein and including the common femoral, femoral, profunda femoral, popliteal and calf veins including the posterior tibial, peroneal and gastrocnemius veins when visible. The superficial great saphenous vein was also interrogated. Spectral Doppler was utilized to evaluate flow at rest and with distal augmentation maneuvers in the common femoral, femoral and popliteal veins. COMPARISON:  None. FINDINGS: Contralateral Common Femoral Vein: Respiratory phasicity is normal and symmetric with the symptomatic side. No evidence of thrombus. Normal compressibility. Common Femoral Vein: No evidence of thrombus. Normal compressibility, respiratory phasicity and response to augmentation. Saphenofemoral Junction: No evidence of thrombus. Normal compressibility and flow on color Doppler imaging. Profunda Femoral Vein: No evidence of thrombus. Normal compressibility and flow on color Doppler imaging. Femoral Vein: No evidence of thrombus. Normal compressibility, respiratory phasicity and response to augmentation. Popliteal Vein: No evidence of  thrombus. Normal compressibility, respiratory phasicity and response to augmentation. Calf Veins: No evidence of thrombus. Normal compressibility and flow on color Doppler imaging. Other Findings: Complex popliteal fossa nondistended Baker's cyst measures 6 cm in length and 3.8 cm in diameter. IMPRESSION: No evidence of deep venous thrombosis. 6 cm nondistended thin elongated Baker's cyst. Electronically Signed   By: Jerilynn Mages.  Shick M.D.   On: 02/27/2021 15:25     Assessment and plan- Patient is a 61 y.o. female referred for IgG lambda MGUS  The results of blood work done on 02/05/2021 with the patient which shows a normal CBC with an H&H of 14.4/43.7.  She did have macrocytosis with an MCV of 100.7 possibly due to B12 deficiency but patient is presently on B12 shots.  No evidence of hypercalcemia or AKI.  Myeloma panel shows no M protein on SPEP but a small amount of IgG lambda monoclonal protein noted on immunofixation.  Serum free light chain ratio is normal.  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  We will hold off on bone survey as well.  We will continue to monitor IgG lambda MGUS once a year and I will see her back in 1 year with CBC with differential CMP myeloma panel and serum free light chains   Visit Diagnosis 1. MGUS (monoclonal gammopathy of unknown significance)      Dr. Randa Evens, MD, MPH [facility] [phone] 03/16/2021 2:25 PM

## 2021-03-18 ENCOUNTER — Ambulatory Visit
Admission: RE | Admit: 2021-03-18 | Discharge: 2021-03-18 | Disposition: A | Discharge status: Home / Self Care | Payer: Medicare Other | Source: Ambulatory Visit | Attending: Sports Medicine | Admitting: Sports Medicine

## 2021-03-18 ENCOUNTER — Other Ambulatory Visit: Payer: Self-pay

## 2021-03-18 DIAGNOSIS — M25462 Effusion, left knee: Secondary | ICD-10-CM | POA: Diagnosis present

## 2021-03-18 DIAGNOSIS — M7122 Synovial cyst of popliteal space [Baker], left knee: Secondary | ICD-10-CM | POA: Insufficient documentation

## 2021-03-18 DIAGNOSIS — M25562 Pain in left knee: Secondary | ICD-10-CM | POA: Insufficient documentation

## 2021-03-18 DIAGNOSIS — S8992XA Unspecified injury of left lower leg, initial encounter: Secondary | ICD-10-CM | POA: Diagnosis present

## 2021-04-29 DIAGNOSIS — I739 Peripheral vascular disease, unspecified: Secondary | ICD-10-CM | POA: Insufficient documentation

## 2021-05-05 NOTE — H&P (Signed)
ORTHOPAEDIC HISTORY & PHYSICAL Teddrick Mallari, Florinda Marker., MD - 04/26/2021 9:00 AM EST Formatting of this note is different from the original. Images from the original note were not included. 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 female who presents today for reevaluation of her left knee. She reported the onset of left knee pain in December when the left leg "gave way" as she was getting out of the car. She localizes most of the pain along the medial aspect of the knee. She reports significant swelling, some locking, and some giving way of the knee. The pain is aggravated by any weight bearing, lateral movements and pivoting. The patient has not appreciated any significant improvement despite NSAIDs, steroids, ice, heat, activity modification, and ambulatory aids.   Medications: Current Outpatient Medications  Medication Sig Dispense Refill   amitriptyline (ELAVIL) 10 MG tablet TAKE 1 TABLET(10 MG) BY MOUTH EVERY NIGHT 30 tablet 0   bisoproloL-hydrochlorothiazide (ZIAC) 10-6.25 mg tablet Take 1 tablet by mouth once daily 90 tablet 3   clonazePAM (KLONOPIN) 0.5 MG tablet TAKE 1 TABLET(0.5 MG) BY MOUTH TWICE DAILY 180 tablet 0   clopidogreL (PLAVIX) 75 mg tablet TAKE 1 TABLET(75 MG) BY MOUTH EVERY DAY 90 tablet 3   ergocalciferol, vitamin D2, 1,250 mcg (50,000 unit) capsule Take 1 capsule (50,000 Units total) by mouth once a week 8 capsule 0   escitalopram oxalate (LEXAPRO) 20 MG tablet TAKE 1 TABLET(20 MG) BY MOUTH EVERY DAY 90 tablet 3   glimepiride (AMARYL) 4 MG tablet Take 1 tablet (4 mg total) by mouth daily with breakfast 90 tablet 1   levothyroxine (SYNTHROID) 50 MCG tablet Take 1.5 tablets (75 mcg total) by mouth once daily Take on an empty stomach with a glass of water at least 30-60 minutes before breakfast. 70 tablet 1   naproxen sodium (ALEVE) 220 mg Cap Take  440 mg by mouth every 6 (six) hours as needed   omeprazole (PRILOSEC) 20 MG DR capsule Take 1 capsule (20 mg total) by mouth once daily as needed Take 30 minutes before breakfast.   pregabalin (LYRICA) 50 MG capsule Take 1 capsule (50 mg total) by mouth 2 (two) times daily 60 capsule 3   rosuvastatin (CRESTOR) 40 MG tablet Take 1 tablet (40 mg total) by mouth once daily 90 tablet 3   TALTZ AUTOINJECTOR, 2 PACK, 80 mg/mL AtIn   topiramate (TOPAMAX) 50 MG tablet Take 1 tablet (50 mg total) by mouth 2 (two) times daily 60 tablet 3   traMADoL (ULTRAM) 50 mg tablet Take 50 mg by mouth at bedtime   No current facility-administered medications for this visit.   Allergies: Allergies  Allergen Reactions   Aspirin Hives   Penicillin G Hives   Past Medical History: Past Medical History:  Diagnosis Date   Anemia   Back pain   Chickenpox   Diabetes mellitus type 2, uncomplicated (CMS-HCC)  Gestational   Esophageal spasm   Hx of migraine headaches   Hyperlipidemia   Hypertension   Psoriasis   Thyroid nodule  thyroid ultrasound 03/2014   Past Surgical History: Past Surgical History:  Procedure Laterality Date   COLONOSCOPY 09/17/2013  Indic: Screening. 1 Tubular Adenoma.   COLONOSCOPY 08/13/2018  Indic: Diarrhea. Descending diverticulitis. Random bx - normal colonic mucosa. PHx polyps - [facility] 08/2023   EGD 08/13/2018  Indic: Dysphagia, GERD. Normal esophagus.  Normal stomach (bx - reactive foveolar hyperplasia, fibrosis, and mixed inflammation c/w tissue adjacent to erosion; minimal chronic gastritis). Duodenitis in 2nd portion of duodenum (path - normal duodenal mucosa).   back surgery   BUNION CORRECTION   CESAREAN SECTION   ENDOSCOPIC CARPAL TUNNEL RELEASE Right   HYSTERECTOMY   RIGHT LONG TRIGGER FINGER RELEASE   RIGHT TRIGGER THUMB RELEASE   Tendon surgery   TRIGGER FINGER RELEASE   Social History: Social History   Socioeconomic History   Marital status: Married  Spouse name:  Richardson Landry   Number of children: 3   Years of education: 12   Highest education level: High school graduate  Occupational History   Occupation: Disabled  Tobacco Use   Smoking status: Former  Types: Cigarettes  Quit date: 05/05/2016  Years since quitting: 4.9   Smokeless tobacco: Never   Tobacco comments:  smokes only when she drinks on occasions.  Vaping Use   Vaping Use: Never used  Substance and Sexual Activity   Alcohol use: Yes  Alcohol/week: 2.0 standard drinks  Types: 2 Standard drinks or equivalent per week  Comment: 1-2 mixed drinks   Drug use: Never   Sexual activity: Defer  Partners: Male   Family History: Family History  Problem Relation Age of Onset   Breast cancer Mother   Bone cancer Mother   Hyperlipidemia (Elevated cholesterol) Mother   High blood pressure (Hypertension) Mother   Diabetes type II Mother   Deep vein thrombosis (DVT or abnormal blood clot formation) Father   Myocardial Infarction (Heart attack) Father   Review of Systems: A comprehensive 14 point ROS was performed, reviewed, and the pertinent orthopaedic findings are documented in the HPI.  Exam BP 126/72   Ht 165.1 cm (5\' 5" )   Wt (!) 115.3 kg (254 lb 3.2 oz)   LMP (LMP Unknown)   BMI 42.30 kg/m   General:  Well-developed, well-nourished female seen in no acute distress.  Antalgic gait.  No varus or valgus thrust to the left knee.  HEENT:  Atraumatic, normocephalic. Pupils are equal and reactive to light. Extraocular motion is intact. Sclera are clear. Oropharynx is clear with moist mucosa.  Lungs:  Clear to auscultation bilaterally.  Cardiovascular: Regular rate and rhythm. Normal S1, S2. No murmur . No appreciable gallops or rubs. Peripheral pulses are palpable. No lower extremity edema. Homan`s test is negative.   Extremities: Good strength, stability, and range of motion of the upper extremities. Good range of motion of the hips and ankles.  Left Knee:  Soft tissue swelling:  moderate Effusion: mild Erythema: none Crepitance: mild Tenderness: medial Alignment: normal Mediolateral laxity: stable Anterior drawer test:negative Lachman`s test: negative McMurray`s test: positive Atrophy: No significant atrophy.  Quadriceps tone was fair to good. Range of Motion: 0/0/90 degrees (limited secondary to guarding)  Neurologic:  Awake, alert, and oriented.  Sensory function is intact to pinprick and light touch.  Motor strength is judged to be 5/5.  Motor coordination is within normal limits.  No apparent clonus. No tremor.   MRI: I reviewed the left knee MRI from [facility] dated 03/18/2021. I concur with the radiologist's interpretation as below:  MRI OF THE LEFT KNEE WITHOUT CONTRAST   TECHNIQUE:  Multiplanar, multisequence MR imaging of the knee was performed. No  intravenous contrast was administered.   COMPARISON:  None.   FINDINGS:  Exam is somewhat limited by patient motion.   MENISCI   Medial meniscus: There is a radial tear  at the meniscal root with  detachment and associated medial protrusion of the meniscus  estimated at 4 mm. Associated typical thickened meniscus with  increased T2 signal intensity.   Lateral meniscus:  Intact   LIGAMENTS   Cruciates:  Intact   Collaterals:  Intact   CARTILAGE   Patellofemoral:  Moderate degenerative chondrosis.   Medial: Moderate to advanced degenerative chondrosis with areas of  near full-thickness cartilage loss, early joint space narrowing and  spurring.   Lateral:  Moderate degenerative chondrosis.   Joint: Moderate-sized joint effusion. Superior and medial patellar  plica are noted.   Popliteal Fossa:  Small Baker's cyst.   Extensor Mechanism: The patella retinacular structures are intact  and the quadriceps and patellar tendons are intact.   Bones:  No acute bony findings.   Other: Unremarkable knee musculature.   IMPRESSION:  1. Radial tear involving the  posterior horn of the medial meniscus  at the meniscal root with detachment and associated medial  protrusion of the meniscus.  2. Intact ligamentous structures and no acute bony findings.  3. Tricompartmental degenerative changes most significant in the  medial compartment.  4. Moderate-sized joint effusion and small Baker's cyst.   Electronically Signed    By: Marijo Sanes M.D.    On: 03/18/2021 13:42  Impression: Internal derangement of the left knee  Plan:  The findings were discussed in detail with the patient. The patient was given informational material on knee arthroscopy. Conservative treatment options were reviewed with the patient. We discussed the risks and benefits of surgical intervention. Arthroscopy is an appropriate treatment for the meniscal pathology, but would have limited or no effect on degenerative changes of the articular cartilage. The usual perioperative course was also discussed in detail. The patient expressed understanding of the risks and benefits of surgical intervention and would like to proceed with plans for left knee arthroscopy.  I spent a total of 40 minutes in both face-to-face and non-face-to-face activities, excluding procedures performed, for this visit on the date of this encounter.  MEDICAL CLEARANCE: Per anesthesiology. ACTIVITIES:  Avoid pivoting, squatting, or twisting. WORK STATUS: Not applicable. THERAPY: Quadriceps strengthening exercises. MEDICATIONS: Requested Prescriptions   No prescriptions requested or ordered in this encounter   FOLLOW-UP: Return for preop History & Physical pending surgery date.   Etheline Geppert P. Holley Bouche., M.D.  This note was generated in part with voice recognition software and I apologize for any typographical errors that were not detected and corrected.  Electronically signed by Lamar Benes., MD at 04/29/2021 11:09 PM EST

## 2021-05-08 ENCOUNTER — Other Ambulatory Visit
Admission: RE | Admit: 2021-05-08 | Discharge: 2021-05-08 | Disposition: A | Discharge status: Home / Self Care | Payer: Medicare Other | Source: Ambulatory Visit | Attending: Orthopedic Surgery | Admitting: Orthopedic Surgery

## 2021-05-08 ENCOUNTER — Other Ambulatory Visit: Payer: Self-pay

## 2021-05-08 ENCOUNTER — Other Ambulatory Visit: Payer: Medicare Other

## 2021-05-08 DIAGNOSIS — I10 Essential (primary) hypertension: Secondary | ICD-10-CM

## 2021-05-08 DIAGNOSIS — Z01812 Encounter for preprocedural laboratory examination: Secondary | ICD-10-CM

## 2021-05-08 HISTORY — DX: Anxiety disorder, unspecified: F41.9

## 2021-05-08 HISTORY — DX: Dyspnea, unspecified: R06.00

## 2021-05-08 HISTORY — DX: Sleep apnea, unspecified: G47.30

## 2021-05-08 HISTORY — DX: Anemia, unspecified: D64.9

## 2021-05-08 HISTORY — DX: Hypothyroidism, unspecified: E03.9

## 2021-05-08 HISTORY — DX: Pneumonia, unspecified organism: J18.9

## 2021-05-08 HISTORY — DX: Unspecified osteoarthritis, unspecified site: M19.90

## 2021-05-08 HISTORY — DX: Type 2 diabetes mellitus without complications: E11.9

## 2021-05-08 NOTE — Patient Instructions (Addendum)
Your procedure is scheduled on: 05/16/21 Wednesday Report to the Registration Desk on the 1st floor of the [facility]. To find out your arrival time, please call [phone] between 1PM - 3PM on: 05/15/21 - Tuesday  REMEMBER: Instructions that are not followed completely may result in serious medical risk, up to and including death; or upon the discretion of your surgeon and anesthesiologist your surgery may need to be rescheduled.  Do not eat food after midnight the night before surgery.  No gum chewing, lozengers or hard candies.  You may however, drink CLEAR liquids up to 2 hours before you are scheduled to arrive for your surgery. Do not drink anything within 2 hours of your scheduled arrival time.  Clear liquids include: - water  Type 1 and Type 2 diabetics should only drink water.  In addition, your doctor has ordered for you to drink the provided  Gatorade G2 Drinking this carbohydrate drink up to two hours before surgery helps to reduce insulin resistance and improve patient outcomes. Please complete drinking 2 hours prior to scheduled arrival time.  TAKE THESE MEDICATIONS THE MORNING OF SURGERY WITH A SIP OF WATER:  - clonazePAM (KLONOPIN) 0.5 MG tablet if needed. - escitalopram (LEXAPRO) 20 MG tablet - levothyroxine (SYNTHROID) 50 MCG tablet - pregabalin (LYRICA) 50 MG capsule - topiramate (TOPAMAX) 50 MG tablet  Use albuterol (PROVENTIL HFA) 108 (90 Base) MCG/ACT inhaler on the day of surgery and bring to the hospital.  Follow recommendations from Cardiologist, Pulmonologist or PCP regarding stopping Aspirin, Coumadin, Plavix, Eliquis, Pradaxa, or Pletal. Do Not take Plavix the day of surgery.  One week prior to surgery: Stop Anti-inflammatories (NSAIDS) such as Advil, Aleve, Ibuprofen, Motrin, Naproxen, Naprosyn and Aspirin based products such as Excedrin, Goodys Powder, BC Powder.  Stop ANY OVER THE COUNTER supplements until after surgery.  You may however,  continue to take Tylenol if needed for pain up until the day of surgery.  No Alcohol for 24 hours before or after surgery.  No Smoking including e-cigarettes for 24 hours prior to surgery.  No chewable tobacco products for at least 6 hours prior to surgery.  No nicotine patches on the day of surgery.  Do not use any "recreational" drugs for at least a week prior to your surgery.  Please be advised that the combination of cocaine and anesthesia may have negative outcomes, up to and including death. If you test positive for cocaine, your surgery will be cancelled.  On the morning of surgery brush your teeth with toothpaste and water, you may rinse your mouth with mouthwash if you wish. Do not swallow any toothpaste or mouthwash.  Use CHG Soap or wipes as directed on instruction sheet.  Do not wear jewelry, make-up, hairpins, clips or nail polish.  Do not wear lotions, powders, or perfumes.   Do not shave body from the neck down 48 hours prior to surgery just in case you cut yourself which could leave a site for infection.  Also, freshly shaved skin may become irritated if using the CHG soap.  Contact lenses, hearing aids and dentures may not be worn into surgery.  Do not bring valuables to the hospital. [facility] is not responsible for any missing/lost belongings or valuables.   Notify your doctor if there is any change in your medical condition (cold, fever, infection).  Wear comfortable clothing (specific to your surgery type) to the hospital.  After surgery, you can help prevent lung complications by doing breathing exercises.  Take deep breaths and cough every 1-2 hours. Your doctor may order a device called an Incentive Spirometer to help you take deep breaths. When coughing or sneezing, hold a pillow firmly against your incision with both hands. This is called splinting. Doing this helps protect your incision. It also decreases belly discomfort.  If you are being admitted  to the hospital overnight, leave your suitcase in the car. After surgery it may be brought to your room.  If you are being discharged the day of surgery, you will not be allowed to drive home. You will need a responsible adult (18 years or older) to drive you home and stay with you that night.   If you are taking public transportation, you will need to have a responsible adult (18 years or older) with you. Please confirm with your physician that it is acceptable to use public transportation.   Please call the [place] [facility] at [phone] if you have any questions about these instructions.  Surgery Visitation Policy:  Patients undergoing a surgery or procedure may have one family member or support person with them as long as that person is not COVID-19 positive or experiencing its symptoms.  That person may remain in the waiting area during the procedure and may rotate out with other people.  Inpatient Visitation:    Visiting hours are 7 a.m. to 8 p.m. Up to two visitors ages 16+ are allowed at one time in a patient room. The visitors may rotate out with other people during the day. Visitors must check out when they leave, or other visitors will not be allowed. One designated support person may remain overnight. The visitor must pass COVID-19 screenings, use hand sanitizer when entering and exiting the patients room and wear a mask at all times, including in the patients room. Patients must also wear a mask when staff or their visitor are in the room. Masking is required regardless of vaccination status.

## 2021-05-09 ENCOUNTER — Other Ambulatory Visit
Admission: RE | Admit: 2021-05-09 | Discharge: 2021-05-09 | Disposition: A | Discharge status: Home / Self Care | Payer: Medicare Other | Source: Ambulatory Visit | Attending: Orthopedic Surgery | Admitting: Orthopedic Surgery

## 2021-05-09 DIAGNOSIS — Z01818 Encounter for other preprocedural examination: Secondary | ICD-10-CM | POA: Diagnosis present

## 2021-05-09 DIAGNOSIS — Z01812 Encounter for preprocedural laboratory examination: Secondary | ICD-10-CM

## 2021-05-09 DIAGNOSIS — I10 Essential (primary) hypertension: Secondary | ICD-10-CM | POA: Diagnosis not present

## 2021-05-09 LAB — CBC
HCT: 43.5 % (ref 36.0–46.0)
Hemoglobin: 13.9 g/dL (ref 12.0–15.0)
MCH: 32.1 pg (ref 26.0–34.0)
MCHC: 32 g/dL (ref 30.0–36.0)
MCV: 100.5 fL — ABNORMAL HIGH (ref 80.0–100.0)
Platelets: 193 10*3/uL (ref 150–400)
RBC: 4.33 MIL/uL (ref 3.87–5.11)
RDW: 14.2 % (ref 11.5–15.5)
WBC: 6.9 10*3/uL (ref 4.0–10.5)
nRBC: 0 % (ref 0.0–0.2)

## 2021-05-09 LAB — BASIC METABOLIC PANEL
Anion gap: 11 (ref 5–15)
BUN: 14 mg/dL (ref 8–23)
CO2: 28 mmol/L (ref 22–32)
Calcium: 9.1 mg/dL (ref 8.9–10.3)
Chloride: 102 mmol/L (ref 98–111)
Creatinine, Ser: 0.69 mg/dL (ref 0.44–1.00)
GFR, Estimated: >60 mL/min (ref >60)
Glucose, Bld: 117 mg/dL — ABNORMAL HIGH (ref 70–99)
Potassium: 3.8 mmol/L (ref 3.5–5.1)
Sodium: 141 mmol/L (ref 135–145)

## 2021-05-10 ENCOUNTER — Encounter (INDEPENDENT_AMBULATORY_CARE_PROVIDER_SITE_OTHER): Payer: Self-pay | Admitting: Nurse Practitioner

## 2021-05-14 ENCOUNTER — Encounter: Payer: Self-pay | Admitting: Orthopedic Surgery

## 2021-05-16 ENCOUNTER — Ambulatory Visit
Admission: RE | Admit: 2021-05-16 | Discharge: 2021-05-16 | Disposition: A | Discharge status: Home / Self Care | Payer: Medicare Other | Attending: Orthopedic Surgery | Admitting: Orthopedic Surgery

## 2021-05-16 ENCOUNTER — Encounter
Admission: RE | Disposition: A | Discharge status: Home / Self Care | Payer: Self-pay | Source: Home / Self Care | Attending: Orthopedic Surgery

## 2021-05-16 ENCOUNTER — Other Ambulatory Visit: Payer: Self-pay

## 2021-05-16 ENCOUNTER — Ambulatory Visit: Payer: Medicare Other | Admitting: Urgent Care

## 2021-05-16 ENCOUNTER — Encounter: Payer: Self-pay | Admitting: Orthopedic Surgery

## 2021-05-16 DIAGNOSIS — Z87891 Personal history of nicotine dependence: Secondary | ICD-10-CM | POA: Diagnosis not present

## 2021-05-16 DIAGNOSIS — Z79899 Other long term (current) drug therapy: Secondary | ICD-10-CM | POA: Insufficient documentation

## 2021-05-16 DIAGNOSIS — S83242A Other tear of medial meniscus, current injury, left knee, initial encounter: Secondary | ICD-10-CM | POA: Insufficient documentation

## 2021-05-16 DIAGNOSIS — E785 Hyperlipidemia, unspecified: Secondary | ICD-10-CM | POA: Diagnosis not present

## 2021-05-16 DIAGNOSIS — M2540 Effusion, unspecified joint: Secondary | ICD-10-CM | POA: Insufficient documentation

## 2021-05-16 DIAGNOSIS — Z7902 Long term (current) use of antithrombotics/antiplatelets: Secondary | ICD-10-CM | POA: Insufficient documentation

## 2021-05-16 DIAGNOSIS — M94262 Chondromalacia, left knee: Secondary | ICD-10-CM | POA: Diagnosis not present

## 2021-05-16 DIAGNOSIS — M2392 Unspecified internal derangement of left knee: Secondary | ICD-10-CM | POA: Diagnosis present

## 2021-05-16 DIAGNOSIS — E039 Hypothyroidism, unspecified: Secondary | ICD-10-CM | POA: Diagnosis not present

## 2021-05-16 DIAGNOSIS — E119 Type 2 diabetes mellitus without complications: Secondary | ICD-10-CM | POA: Insufficient documentation

## 2021-05-16 DIAGNOSIS — K219 Gastro-esophageal reflux disease without esophagitis: Secondary | ICD-10-CM | POA: Diagnosis not present

## 2021-05-16 DIAGNOSIS — Z7984 Long term (current) use of oral hypoglycemic drugs: Secondary | ICD-10-CM | POA: Insufficient documentation

## 2021-05-16 DIAGNOSIS — Z79890 Hormone replacement therapy (postmenopausal): Secondary | ICD-10-CM | POA: Insufficient documentation

## 2021-05-16 DIAGNOSIS — M7122 Synovial cyst of popliteal space [Baker], left knee: Secondary | ICD-10-CM | POA: Diagnosis not present

## 2021-05-16 DIAGNOSIS — X58XXXA Exposure to other specified factors, initial encounter: Secondary | ICD-10-CM | POA: Insufficient documentation

## 2021-05-16 DIAGNOSIS — Z6841 Body Mass Index (BMI) 40.0 and over, adult: Secondary | ICD-10-CM | POA: Diagnosis not present

## 2021-05-16 DIAGNOSIS — I10 Essential (primary) hypertension: Secondary | ICD-10-CM | POA: Insufficient documentation

## 2021-05-16 DIAGNOSIS — Z9889 Other specified postprocedural states: Secondary | ICD-10-CM

## 2021-05-16 HISTORY — DX: Atherosclerosis of aorta: I70.0

## 2021-05-16 HISTORY — DX: Other long term (current) drug therapy: Z79.899

## 2021-05-16 HISTORY — DX: Disorder of arteries and arterioles, unspecified: I77.9

## 2021-05-16 HISTORY — DX: Obstructive sleep apnea (adult) (pediatric): G47.33

## 2021-05-16 HISTORY — DX: Atherosclerotic heart disease of native coronary artery without angina pectoris: I25.10

## 2021-05-16 HISTORY — DX: Type 2 diabetes mellitus without complications: E11.9

## 2021-05-16 HISTORY — DX: Long term (current) use of unspecified immunomodulators and immunosuppressants: Z79.60

## 2021-05-16 HISTORY — DX: Arthropathic psoriasis, unspecified: L40.50

## 2021-05-16 HISTORY — DX: Other chronic pain: G89.29

## 2021-05-16 HISTORY — DX: Other hereditary and idiopathic neuropathies: G60.8

## 2021-05-16 HISTORY — DX: Migraine, unspecified, not intractable, without status migrainosus: G43.909

## 2021-05-16 HISTORY — DX: Deficiency of other specified B group vitamins: E53.8

## 2021-05-16 HISTORY — PX: KNEE ARTHROSCOPY: SHX127

## 2021-05-16 HISTORY — DX: Long term (current) use of antithrombotics/antiplatelets: Z79.02

## 2021-05-16 LAB — GLUCOSE, CAPILLARY: Glucose-Capillary: 148 mg/dL — ABNORMAL HIGH (ref 70–99)

## 2021-05-16 SURGERY — ARTHROSCOPY, KNEE
Anesthesia: General | Site: Knee | Laterality: Left

## 2021-05-16 MED ORDER — DEXAMETHASONE SODIUM PHOSPHATE 10 MG/ML IJ SOLN
INTRAMUSCULAR | Status: DC | PRN
  Administered 2021-05-16: 5 mg via INTRAVENOUS

## 2021-05-16 MED ORDER — ONDANSETRON HCL 4 MG/2ML IJ SOLN
INTRAMUSCULAR | Status: AC
  Filled 2021-05-16: qty 2

## 2021-05-16 MED ORDER — PROPOFOL 10 MG/ML IV BOLUS
INTRAVENOUS | Status: DC | PRN
Start: 2021-05-16 — End: 2021-05-16
  Administered 2021-05-16: 150 mg via INTRAVENOUS

## 2021-05-16 MED ORDER — DEXMEDETOMIDINE (PRECEDEX) IN NS 20 MCG/5ML (4 MCG/ML) IV SYRINGE
PREFILLED_SYRINGE | INTRAVENOUS | Status: DC | PRN
  Administered 2021-05-16: 12 ug via INTRAVENOUS

## 2021-05-16 MED ORDER — ALBUTEROL SULFATE (2.5 MG/3ML) 0.083% IN NEBU
INHALATION_SOLUTION | RESPIRATORY_TRACT | Status: AC
  Filled 2021-05-16: qty 3

## 2021-05-16 MED ORDER — ONDANSETRON HCL 4 MG/2ML IJ SOLN: 4.0000 mg | Freq: Once | INTRAMUSCULAR | Status: DC | PRN

## 2021-05-16 MED ORDER — EPHEDRINE SULFATE (PRESSORS) 50 MG/ML IJ SOLN
INTRAMUSCULAR | Status: DC | PRN
  Administered 2021-05-16: 5 mg via INTRAVENOUS

## 2021-05-16 MED ORDER — HYDROCODONE-ACETAMINOPHEN 5-325 MG PO TABS
ORAL_TABLET | ORAL | Status: AC
  Administered 2021-05-16: 2 via ORAL
  Filled 2021-05-16: qty 2

## 2021-05-16 MED ORDER — BUPIVACAINE-EPINEPHRINE 0.25% -1:200000 IJ SOLN
INTRAMUSCULAR | Status: DC | PRN
Start: 2021-05-16 — End: 2021-05-16
  Administered 2021-05-16: 30 mL

## 2021-05-16 MED ORDER — BUPIVACAINE-EPINEPHRINE (PF) 0.25% -1:200000 IJ SOLN
INTRAMUSCULAR | Status: AC
  Filled 2021-05-16: qty 60

## 2021-05-16 MED ORDER — LACTATED RINGERS IV SOLN: INTRAVENOUS | Status: DC

## 2021-05-16 MED ORDER — ONDANSETRON HCL 4 MG/2ML IJ SOLN
INTRAMUSCULAR | Status: DC | PRN
  Administered 2021-05-16: 4 mg via INTRAVENOUS

## 2021-05-16 MED ORDER — CELECOXIB 200 MG PO CAPS
ORAL_CAPSULE | ORAL | Status: AC
  Administered 2021-05-16: 400 mg via ORAL
  Filled 2021-05-16: qty 2

## 2021-05-16 MED ORDER — MIDAZOLAM HCL 2 MG/2ML IJ SOLN
INTRAMUSCULAR | Status: DC | PRN
  Administered 2021-05-16: 2 mg via INTRAVENOUS

## 2021-05-16 MED ORDER — ORAL CARE MOUTH RINSE: 15.0000 mL | Freq: Once | OROMUCOSAL | Status: AC

## 2021-05-16 MED ORDER — MORPHINE SULFATE 4 MG/ML IJ SOLN
INTRAMUSCULAR | Status: DC | PRN
Start: 2021-05-16 — End: 2021-05-16
  Administered 2021-05-16: 4 mg via SUBCUTANEOUS

## 2021-05-16 MED ORDER — EPHEDRINE 5 MG/ML INJ
INTRAVENOUS | Status: AC
  Filled 2021-05-16: qty 5

## 2021-05-16 MED ORDER — DEXAMETHASONE SODIUM PHOSPHATE 10 MG/ML IJ SOLN
INTRAMUSCULAR | Status: AC
  Filled 2021-05-16: qty 1

## 2021-05-16 MED ORDER — FENTANYL CITRATE (PF) 100 MCG/2ML IJ SOLN: 25.0000 ug | INTRAMUSCULAR | Status: DC | PRN

## 2021-05-16 MED ORDER — LACTATED RINGERS IR SOLN
Status: DC | PRN
  Administered 2021-05-16 (×4): 3000 mL

## 2021-05-16 MED ORDER — FENTANYL CITRATE (PF) 100 MCG/2ML IJ SOLN
INTRAMUSCULAR | Status: AC
Start: 2021-05-16 — End: ?
  Filled 2021-05-16: qty 2

## 2021-05-16 MED ORDER — FENTANYL CITRATE (PF) 100 MCG/2ML IJ SOLN
INTRAMUSCULAR | Status: DC | PRN
Start: 2021-05-16 — End: 2021-05-16
  Administered 2021-05-16: 25 ug via INTRAVENOUS
  Administered 2021-05-16: 50 ug via INTRAVENOUS
  Administered 2021-05-16: 25 ug via INTRAVENOUS

## 2021-05-16 MED ORDER — CHLORHEXIDINE GLUCONATE 0.12 % MT SOLN: 15.0000 mL | Freq: Once | OROMUCOSAL | Status: AC

## 2021-05-16 MED ORDER — CELECOXIB 200 MG PO CAPS: 400.0000 mg | ORAL_CAPSULE | Freq: Once | ORAL | Status: AC

## 2021-05-16 MED ORDER — FAMOTIDINE 20 MG PO TABS
ORAL_TABLET | ORAL | Status: AC
  Administered 2021-05-16: 20 mg via ORAL
  Filled 2021-05-16: qty 1

## 2021-05-16 MED ORDER — MIDAZOLAM HCL 2 MG/2ML IJ SOLN
INTRAMUSCULAR | Status: AC
  Filled 2021-05-16: qty 2

## 2021-05-16 MED ORDER — LIDOCAINE HCL (CARDIAC) PF 100 MG/5ML IV SOSY
PREFILLED_SYRINGE | INTRAVENOUS | Status: DC | PRN
  Administered 2021-05-16: 80 mg via INTRAVENOUS

## 2021-05-16 MED ORDER — MORPHINE SULFATE (PF) 4 MG/ML IV SOLN
INTRAVENOUS | Status: AC
  Filled 2021-05-16: qty 1

## 2021-05-16 MED ORDER — ACETAMINOPHEN 10 MG/ML IV SOLN
INTRAVENOUS | Status: DC | PRN
  Administered 2021-05-16: 1000 mg via INTRAVENOUS

## 2021-05-16 MED ORDER — CHLORHEXIDINE GLUCONATE 0.12 % MT SOLN
OROMUCOSAL | Status: AC
  Administered 2021-05-16: 15 mL via OROMUCOSAL
  Filled 2021-05-16: qty 15

## 2021-05-16 MED ORDER — HYDROCODONE-ACETAMINOPHEN 5-325 MG PO TABS: 1.0000 | ORAL_TABLET | Freq: Four times a day (QID) | ORAL | 0 refills | Status: DC | PRN

## 2021-05-16 MED ORDER — GLYCOPYRROLATE 0.2 MG/ML IJ SOLN
INTRAMUSCULAR | Status: DC | PRN
  Administered 2021-05-16: .2 mg via INTRAVENOUS

## 2021-05-16 MED ORDER — FAMOTIDINE 20 MG PO TABS: 20.0000 mg | ORAL_TABLET | Freq: Once | ORAL | Status: AC

## 2021-05-16 MED ORDER — ACETAMINOPHEN 10 MG/ML IV SOLN
INTRAVENOUS | Status: AC
  Filled 2021-05-16: qty 100

## 2021-05-16 MED ORDER — HYDROCODONE-ACETAMINOPHEN 5-325 MG PO TABS: 1.0000 | ORAL_TABLET | Freq: Four times a day (QID) | ORAL | Status: AC | PRN

## 2021-05-16 SURGICAL SUPPLY — 31 items
ADAPTER IRRIG TUBE 2 SPIKE SOL (ADAPTER) ×3 IMPLANT
ADPR TBG 2 SPK PMP STRL ASCP (ADAPTER) ×1
BLADE SHAVER 4.5 DBL SERAT CV (CUTTER) IMPLANT
BNDG CMPR STD VLCR NS LF 5.8X6 (GAUZE/BANDAGES/DRESSINGS) ×1
BNDG ELASTIC 6X5.8 VLCR NS LF (GAUZE/BANDAGES/DRESSINGS) ×2 IMPLANT
DRAPE ARTHRO LIMB 89X125 STRL (DRAPES) ×4 IMPLANT
DRSG DERMACEA 8X12 NADH (GAUZE/BANDAGES/DRESSINGS) ×2 IMPLANT
DURAPREP 26ML APPLICATOR (WOUND CARE) ×2 IMPLANT
GAUZE SPONGE 4X4 12PLY STRL (GAUZE/BANDAGES/DRESSINGS) ×2 IMPLANT
GLOVE SURG ENC TEXT LTX SZ7.5 (GLOVE) ×2 IMPLANT
GLOVE SURG UNDER LTX SZ8 (GLOVE) ×2 IMPLANT
GOWN STRL REUS W/ TWL LRG LVL3 (GOWN DISPOSABLE) ×2 IMPLANT
GOWN STRL REUS W/TWL LRG LVL3 (GOWN DISPOSABLE) ×4
IV LACTATED RINGER IRRG 3000ML (IV SOLUTION) ×8
IV LR IRRIG 3000ML ARTHROMATIC (IV SOLUTION) ×2 IMPLANT
KIT TURNOVER KIT A (KITS) ×2 IMPLANT
MANIFOLD NEPTUNE II (INSTRUMENTS) ×2 IMPLANT
PACK ARTHROSCOPY KNEE (MISCELLANEOUS) ×2 IMPLANT
PADDING CAST 6X4YD NS (MISCELLANEOUS) ×1
PADDING CAST COTTON 6X4 NS (MISCELLANEOUS) ×1 IMPLANT
SHAVER BLADE TAPERED BLUNT 4 (BLADE) ×2 IMPLANT
SOL PREP PVP 2OZ (MISCELLANEOUS) ×2
SOLUTION PREP PVP 2OZ (MISCELLANEOUS) ×1 IMPLANT
SPONGE T-LAP 18X18 ~~LOC~~+RFID (SPONGE) ×2 IMPLANT
SUT ETHILON 3-0 FS-10 30 BLK (SUTURE) ×2
SUTURE EHLN 3-0 FS-10 30 BLK (SUTURE) ×1 IMPLANT
TUBING INFLOW SET DBFLO PUMP (TUBING) ×2 IMPLANT
TUBING OUTFLOW SET DBLFO PUMP (TUBING) ×2 IMPLANT
WAND HAND CNTRL MULTIVAC 50 (MISCELLANEOUS) ×2 IMPLANT
WATER STERILE IRR 500ML POUR (IV SOLUTION) ×2 IMPLANT
WRAP KNEE W/COLD PACKS 25.5X14 (SOFTGOODS) ×2 IMPLANT

## 2021-05-16 NOTE — Discharge Instructions (Addendum)

## 2021-05-16 NOTE — Transfer of Care (Signed)
Immediate Anesthesia Transfer of Care Note ? ?Patient: Taylor [name] ? ?Procedure(s) Performed: ARTHROSCOPY KNEE (Left: Knee) ? ?Patient Location: PACU ? ?Anesthesia Type:General ? ?Level of Consciousness: drowsy ? ?Airway & Oxygen Therapy: Patient Spontanous Breathing and Patient connected to face mask oxygen ? ?Post-op Assessment: Report given to RN ? ?Post vital signs: stable ? ?Last Vitals:  ?Vitals Value Taken Time  ?BP    ?Temp    ?Pulse 74 05/16/21 1232  ?Resp 18 05/16/21 1232  ?SpO2 95 % 05/16/21 1232  ?Vitals shown include unvalidated device data. ? ?Last Pain:  ?Vitals:  ? 05/16/21 1035  ?TempSrc: Temporal  ?PainSc: 8   ?   ? ?  ? ?Complications: No notable events documented. ?

## 2021-05-16 NOTE — Anesthesia Procedure Notes (Signed)
Procedure Name: LMA Insertion ?Date/Time: 05/16/2021 11:17 AM ?Performed by: Lerry Liner, CRNA ?Pre-anesthesia Checklist: Patient identified, Emergency Drugs available, Suction available and Patient being monitored ?Patient Re-evaluated:Patient Re-evaluated prior to induction ?Oxygen Delivery Method: Circle system utilized ?Preoxygenation: Pre-oxygenation with 100% oxygen ?Induction Type: IV induction ?Ventilation: Mask ventilation without difficulty ?LMA: LMA inserted ?LMA Size: 4.0 ?Number of attempts: 1 ?Placement Confirmation: positive ETCO2 and breath sounds checked- equal and bilateral ?Tube secured with: Tape ?Dental Injury: Teeth and Oropharynx as per pre-operative assessment  ? ? ? ? ?

## 2021-05-16 NOTE — Anesthesia Preprocedure Evaluation (Signed)
Anesthesia Evaluation  ?Patient identified by MRN, date of birth, ID band ?Patient awake ? ? ? ?Reviewed: ?Allergy & Precautions, H&P , NPO status , Patient's Chart, lab work & pertinent test results, reviewed documented beta blocker date and time  ? ?History of Anesthesia Complications ?Negative for: history of anesthetic complications ? ?Airway ?Mallampati: IV ? ?TM Distance: >3 FB ?Neck ROM: full ? ? ? Dental ? ?(+) Dental Advidsory Given, Edentulous Upper, Upper Dentures ?  ?Pulmonary ?neg shortness of breath, sleep apnea , neg COPD, neg recent URI, former smoker,  ?  ?Pulmonary exam normal ?breath sounds clear to auscultation ? ? ? ? ? ? Cardiovascular ?Exercise Tolerance: Good ?hypertension, (-) angina+ CAD and + Peripheral Vascular Disease  ?(-) Past MI and (-) Cardiac Stents Normal cardiovascular exam(-) dysrhythmias (-) Valvular Problems/Murmurs ?Rhythm:regular Rate:Normal ? ? ?  ?Neuro/Psych ?PSYCHIATRIC DISORDERS Anxiety Depression negative neurological ROS ?   ? GI/Hepatic ?negative GI ROS, Neg liver ROS,   ?Endo/Other  ?diabetesHypothyroidism Morbid obesity ? Renal/GU ?negative Renal ROS  ?negative genitourinary ?  ?Musculoskeletal ? ? Abdominal ?  ?Peds ? Hematology ?negative hematology ROS ?(+)   ?Anesthesia Other Findings ?Past Medical History: ?No date: Anemia ?No date: Anxiety ?    Comment:  a.) on BZO (clonazepam) PRN ?No date: Aortic atherosclerosis ([place]) ?No date: Arthritis ?No date: B12 deficiency ?No date: Bilateral carotid artery disease ([place]) ?No date: Chronic back pain ?No date: Coronary artery calcification seen on CT scan ?No date: Depression ?85/63/1497: Diastolic dysfunction ?    Comment:  a.) TTE 09/01/2019: >55%; mild LA enlargement; triv PR,  ?             mild MR/TR; G1DD. ?No date: Dyspnea ?No date: High cholesterol ?No date: Hypertension ?No date: Hypothyroidism ?No date: Long term current use of antithrombotics/antiplatelets ?    Comment:  a.)  clopidogrel ?No date: Long term current use of immunosuppressive drug ?    Comment:  a.) on Ixekizumab Donnetta Hail) for psoriatic athritis ?No date: Migraines ?01/01/2021: Monoclonal gammopathy of unknown significance (MGUS) ?    Comment:  a.) IgG lambda MGUS ?No date: OSA (obstructive sleep apnea) ?    Comment:  a.) does not require nocturnal PAP therapy ?No date: Pneumonia ?No date: Psoriatic arthritis ([place]) ?    Comment:  a.) on Ixekizumab Donnetta Hail) ?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 ?No date: T2DM (type 2 diabetes mellitus) ([place]) ? ? Reproductive/Obstetrics ?negative OB ROS ? ?  ? ? ? ? ? ? ? ? ? ? ? ? ? ?  ?  ? ? ? ? ? ? ? ? ?Anesthesia Physical ?Anesthesia Plan ? ?ASA: 3 ? ?Anesthesia Plan: General  ? ?Post-op Pain Management:   ? ?Induction: Intravenous ? ?PONV Risk Score and Plan: 3 and Ondansetron, Dexamethasone, Treatment may vary due to age or medical condition and Midazolam ? ?Airway Management Planned: LMA and Oral ETT ? ?Additional Equipment:  ? ?Intra-op Plan:  ? ?Post-operative Plan: Extubation in OR ? ?Informed Consent: I have reviewed the patients History and Physical, chart, labs and discussed the procedure including the risks, benefits and alternatives for the proposed anesthesia with the patient or authorized representative who has indicated his/her understanding and acceptance.  ? ? ? ?Dental Advisory Given ? ?Plan Discussed with: Anesthesiologist, CRNA and Surgeon ? ?Anesthesia Plan Comments:   ? ? ? ? ? ? ?Anesthesia Quick Evaluation ? ?

## 2021-05-16 NOTE — H&P (Signed)
The patient has been re-examined, and the chart reviewed, and there have been no interval changes to the documented history and physical.    The risks, benefits, and alternatives have been discussed at length. The patient expressed understanding of the risks benefits and agreed with plans for surgical intervention.  Inez Stantz P. Biran Mayberry, Jr. M.D.    

## 2021-05-16 NOTE — Op Note (Signed)
OPERATIVE NOTE ? ?DATE OF SURGERY:  05/16/2021 ? ?PATIENT NAME:  Taylor [name]   ?DOB: 10-Jul-1960  ?MRN: [id number] ? ? ?PRE-OPERATIVE DIAGNOSIS:  Internal derangement of the left knee  ? 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 ?PROCEDURE: Left knee arthroscopy, partial medial meniscectomy, and chondroplasty ? ?SURGEON:  Marciano Sequin., M.D.  ? ?ASSISTANT: none ? ?ANESTHESIA: general ? ?ESTIMATED BLOOD LOSS: Minimal ? ?FLUIDS REPLACED: 800 mL of crystalloid ? ?TOURNIQUET TIME: Not used ? ?INDICATIONS FOR SURGERY: Taylor [name] is a 61 y.o. year old female who has been seen for complaints of left knee pain. MRI demonstrated findings consistent with meniscal pathology. After discussion of the risks and benefits of surgical intervention, the patient expressed understanding of the risks benefits and agree with plans for left knee arthroscopy.  ? ?PROCEDURE IN DETAIL: The patient was brought into the operating room and, after adequate general anesthesia was achieved, a tourniquet was applied to the left thigh and the leg was placed in the leg holder. All bony prominences were well padded. The patient's left knee was cleaned and prepped with alcohol and Duraprep and draped in the usual sterile fashion. A "timeout" was performed as per usual protocol. The anticipated portal sites were injected with 0.25% Marcaine with epinephrine. An anterolateral incision was made and a cannula was inserted. A moderate effusion was evacuated and the knee was distended with fluid using the pump. The scope was advanced down the medial gutter into the medial compartment. Under visualization with the scope, an anteromedial portal was created and a hooked probe was inserted. The medial meniscus was visualized and probed.  There was a complex tear of the posterior horn of the medial meniscus as well as along the root of the  medial meniscus.  The tear was debrided using meniscal punches and a shaver.  Final contouring was performed using the 50 degree ArthroCare wand.  The remaining rim of meniscus was visualized and probed and felt to be stable.  The articular cartilage was visualized.  There were changes of grade III chondromalacia involving areas of the medial femoral condyle and less so to the medial tibial plateau.  These areas were debrided and contoured using the ArthroCare wand. ? ?The scope was then advanced into the intercondylar notch. The anterior cruciate ligament was visualized and probed and felt to be intact. The scope was removed from the lateral portal and reinserted via the anteromedial portal to better visualize the lateral compartment. The lateral meniscus was visualized and probed.  The lateral meniscus was intact without evidence of tear or instability.  The articular cartilage of the lateral compartment was visualized and noted to be in good condition.  Finally, the scope was advanced so as to visualize the patellofemoral articulation. Good patellar tracking was appreciated.  The articular surface was in good condition. ? ?The knee was irrigated with copius amounts of fluid and suctioned dry. The anterolateral portal was re-approximated with #3-0 nylon. A combination of 0.25% Marcaine with epinephrine and 4 mg of Morphine were injected via the scope. The scope was removed and the anteromedial portal was re-approximated with #3-0 nylon. A sterile dressing was applied followed by application of an ice wrap. ? ?The patient tolerated the procedure well and was transported to the PACU in stable condition. ? ?Duran Ohern P. Holley Bouche., M.D.  ?

## 2021-05-17 ENCOUNTER — Encounter: Payer: Self-pay | Admitting: Orthopedic Surgery

## 2021-05-17 NOTE — Anesthesia Postprocedure Evaluation (Signed)
Anesthesia Post Note ? ?Patient: Taylor [name] ? ?Procedure(s) Performed: ARTHROSCOPY KNEE (Left: Knee) ? ?Patient location during evaluation: PACU ?Anesthesia Type: General ?Level of consciousness: awake and alert ?Pain management: pain level controlled ?Vital Signs Assessment: post-procedure vital signs reviewed and stable ?Respiratory status: spontaneous breathing, nonlabored ventilation, respiratory function stable and patient connected to nasal cannula oxygen ?Cardiovascular status: blood pressure returned to baseline and stable ?Postop Assessment: no apparent nausea or vomiting ?Anesthetic complications: no ? ? ?No notable events documented. ? ? ?Last Vitals:  ?Vitals:  ? 05/16/21 1445 05/16/21 1451  ?BP:  136/71  ?Pulse: 70 67  ?Resp:  20  ?Temp:    ?SpO2: (!) 87% 93%  ?  ?Last Pain:  ?Vitals:  ? 05/17/21 0851  ?TempSrc:   ?PainSc: 2   ? ? ?  ?  ?  ?  ?  ?  ? ?Martha Clan ? ? ? ? ?

## 2021-06-22 ENCOUNTER — Other Ambulatory Visit: Payer: Self-pay | Admitting: Internal Medicine

## 2021-06-22 DIAGNOSIS — Z1231 Encounter for screening mammogram for malignant neoplasm of breast: Secondary | ICD-10-CM

## 2021-09-03 ENCOUNTER — Other Ambulatory Visit: Payer: Self-pay | Admitting: Podiatry

## 2021-09-03 ENCOUNTER — Ambulatory Visit
Admission: RE | Admit: 2021-09-03 | Discharge: 2021-09-03 | Disposition: A | Discharge status: Home / Self Care | Payer: Medicare Other | Source: Ambulatory Visit | Attending: Podiatry | Admitting: Podiatry

## 2021-09-03 DIAGNOSIS — R600 Localized edema: Secondary | ICD-10-CM | POA: Insufficient documentation

## 2021-09-03 DIAGNOSIS — M25572 Pain in left ankle and joints of left foot: Secondary | ICD-10-CM

## 2021-10-30 ENCOUNTER — Ambulatory Visit
Admission: RE | Admit: 2021-10-30 | Discharge: 2021-10-30 | Disposition: A | Discharge status: Home / Self Care | Payer: Medicare Other | Source: Ambulatory Visit | Attending: Internal Medicine | Admitting: Internal Medicine

## 2021-10-30 DIAGNOSIS — Z1231 Encounter for screening mammogram for malignant neoplasm of breast: Secondary | ICD-10-CM | POA: Insufficient documentation

## 2021-12-17 ENCOUNTER — Other Ambulatory Visit: Payer: Self-pay | Admitting: Orthopedic Surgery

## 2021-12-17 DIAGNOSIS — M75102 Unspecified rotator cuff tear or rupture of left shoulder, not specified as traumatic: Secondary | ICD-10-CM

## 2021-12-28 ENCOUNTER — Ambulatory Visit
Admission: RE | Admit: 2021-12-28 | Discharge: 2021-12-28 | Disposition: A | Discharge status: Home / Self Care | Payer: Medicare Other | Source: Ambulatory Visit | Attending: Orthopedic Surgery | Admitting: Orthopedic Surgery

## 2021-12-28 DIAGNOSIS — M75102 Unspecified rotator cuff tear or rupture of left shoulder, not specified as traumatic: Secondary | ICD-10-CM

## 2022-01-07 ENCOUNTER — Encounter (INDEPENDENT_AMBULATORY_CARE_PROVIDER_SITE_OTHER): Payer: Self-pay

## 2022-01-11 DIAGNOSIS — M7582 Other shoulder lesions, left shoulder: Secondary | ICD-10-CM | POA: Insufficient documentation

## 2022-01-28 ENCOUNTER — Other Ambulatory Visit: Payer: Self-pay | Admitting: Surgery

## 2022-02-07 ENCOUNTER — Inpatient Hospital Stay: Admission: RE | Admit: 2022-02-07 | Payer: Medicare Other | Source: Ambulatory Visit

## 2022-02-07 ENCOUNTER — Encounter
Admission: RE | Admit: 2022-02-07 | Discharge: 2022-02-07 | Disposition: A | Discharge status: Home / Self Care | Payer: Medicare Other | Source: Ambulatory Visit | Attending: Surgery | Admitting: Surgery

## 2022-02-07 ENCOUNTER — Other Ambulatory Visit: Payer: Self-pay

## 2022-02-07 VITALS — Ht 65.0 in | Wt 251.0 lb

## 2022-02-07 DIAGNOSIS — E118 Type 2 diabetes mellitus with unspecified complications: Secondary | ICD-10-CM

## 2022-02-07 NOTE — Patient Instructions (Addendum)
Your procedure is scheduled on: 02/14/2022  Report to the Registration Desk on the 1st floor of the [facility]. To find out your arrival time, please call [phone] between 1PM - 3PM on: 02/13/2022  If your arrival time is 6:00 am, do not arrive prior to that time as the [facility] entrance doors do not open until 6:00 am.  REMEMBER: Instructions that are not followed completely may result in serious medical risk, up to and including death; or upon the discretion of your surgeon and anesthesiologist your surgery may need to be rescheduled.  Do not eat food after midnight the night before surgery.  No gum chewing, lozengers or hard candies.  You may however, drink water up to 2 hours before you are scheduled to arrive for your surgery. Do not drink anything within 2 hours of your scheduled arrival time.  Type 1 and Type 2 diabetics should only drink water.  In addition, your doctor has ordered for you to drink the provided  Gatorade G2 Drinking this carbohydrate drink up to two hours before surgery helps to reduce insulin resistance and improve patient outcomes. Please complete drinking 2 hours prior to scheduled arrival time.  TAKE THESE MEDICATIONS THE MORNING OF SURGERY WITH A SIP OF WATER: Bisoprolol-hydrochlorotiazide Lexapro levothyroxine  pregabalin  rosuvastatin   **Follow new guidelines for insulin and diabetes medications.**   Follow recommendations from Cardiologist, Pulmonologist or PCP regarding stopping Aspirin, Coumadin, Plavix, Eliquis, Pradaxa, or Pletal.- Very important.! Please remember the dates of the last dose of all of your medications.  One week prior to surgery: Stop Anti-inflammatories (NSAIDS) such as Advil, Aleve, Ibuprofen, Motrin, Naproxen, Naprosyn and Aspirin based products such as Excedrin, Goodys Powder, BC Powder. Stop ANY OVER THE COUNTER supplements until after surgery like bit B-12 and vit D You may however, continue to take Tylenol if  needed for pain up until the day of surgery.  No Alcohol for 24 hours before or after surgery.  No Smoking including e-cigarettes for 24 hours prior to surgery.  No chewable tobacco products for at least 6 hours prior to surgery.  No nicotine patches on the day of surgery.  Do not use any "recreational" drugs for at least a week prior to your surgery.  Please be advised that the combination of cocaine and anesthesia may have negative outcomes, up to and including death. If you test positive for cocaine, your surgery will be cancelled.  On the morning of surgery brush your teeth with toothpaste and water, you may rinse your mouth with mouthwash if you wish. Do not swallow any toothpaste or mouthwash.  Use CHG Soap  directed on instruction sheet.  Do not wear jewelry, make-up, hairpins, clips or nail polish.  Do not wear lotions, powders, or perfumes.   Do not shave body from the neck down 48 hours prior to surgery just in case you cut yourself which could leave a site for infection.  Also, freshly shaved skin may become irritated if using the CHG soap.  Contact lenses, hearing aids and dentures may not be worn into surgery.  Do not bring valuables to the hospital. [facility] is not responsible for any missing/lost belongings or valuables.   Notify your doctor if there is any change in your medical condition (cold, fever, infection).  Wear comfortable clothing (specific to your surgery type) to the hospital.  After surgery, you can help prevent lung complications by doing breathing exercises.  Take deep breaths and cough every 1-2 hours.  Your doctor may order a device called an Incentive Spirometer to help you take deep breaths. If you are being admitted to the hospital overnight, leave your suitcase in the car. After surgery it may be brought to your room.  If you are being discharged the day of surgery, you will not be allowed to drive home. You will need a responsible adult  (18 years or older) to drive you home and stay with you that night.   If you are taking public transportation, you will need to have a responsible adult (18 years or older) with you. Please confirm with your physician that it is acceptable to use public transportation.   Please call the [facility] Dept. at [phone] if you have any questions about these instructions.  Surgery Visitation Policy:  Patients undergoing a surgery or procedure may have two family members or support persons with them as long as the person is not COVID-19 positive or experiencing its symptoms.   Inpatient Visitation:    Visiting hours are 7 a.m. to 8 p.m. Up to four visitors are allowed at one time in a patient room. The visitors may rotate out with other people during the day. One designated support person (adult) may remain overnight.  MASKING: Due to an increase in RSV rates and hospitalizations, in-patient care areas in which we serve newborns, infants and children, masks will be required for teammates and visitors.  Children ages 29 and under may not visit. This policy affects the following departments only:  [facility] area Mother Baby Unit Newborn nursery/Special care nursery  Other areas: Masks continue to be strongly recommended for [facility] teammates, visitors and patients in all other areas. Visitation is not restricted outside of the units listed above.

## 2022-02-08 ENCOUNTER — Encounter
Admission: RE | Admit: 2022-02-08 | Discharge: 2022-02-08 | Disposition: A | Discharge status: Home / Self Care | Payer: Medicare Other | Source: Ambulatory Visit | Attending: Surgery | Admitting: Surgery

## 2022-02-08 ENCOUNTER — Encounter: Payer: Self-pay | Admitting: Urgent Care

## 2022-02-08 DIAGNOSIS — E118 Type 2 diabetes mellitus with unspecified complications: Secondary | ICD-10-CM | POA: Insufficient documentation

## 2022-02-08 DIAGNOSIS — Z01818 Encounter for other preprocedural examination: Secondary | ICD-10-CM | POA: Diagnosis present

## 2022-02-08 LAB — BASIC METABOLIC PANEL
Anion gap: 10 (ref 5–15)
BUN: 15 mg/dL (ref 8–23)
CO2: 27 mmol/L (ref 22–32)
Calcium: 9.4 mg/dL (ref 8.9–10.3)
Chloride: 106 mmol/L (ref 98–111)
Creatinine, Ser: 0.82 mg/dL (ref 0.44–1.00)
GFR, Estimated: >60 mL/min (ref >60)
Glucose, Bld: 93 mg/dL (ref 70–99)
Potassium: 3.7 mmol/L (ref 3.5–5.1)
Sodium: 143 mmol/L (ref 135–145)

## 2022-02-13 MED ORDER — CHLORHEXIDINE GLUCONATE 0.12 % MT SOLN: 15.0000 mL | Freq: Once | OROMUCOSAL | Status: AC

## 2022-02-13 MED ORDER — VANCOMYCIN HCL IN DEXTROSE 1-5 GM/200ML-% IV SOLN: 1000.0000 mg | INTRAVENOUS | Status: DC

## 2022-02-13 MED ORDER — SODIUM CHLORIDE 0.9 % IV SOLN: INTRAVENOUS | Status: DC

## 2022-02-13 MED ORDER — ORAL CARE MOUTH RINSE: 15.0000 mL | Freq: Once | OROMUCOSAL | Status: AC

## 2022-02-13 MED ORDER — FAMOTIDINE 20 MG PO TABS: 20.0000 mg | ORAL_TABLET | Freq: Once | ORAL | Status: AC

## 2022-02-14 ENCOUNTER — Ambulatory Visit: Payer: Medicare Other

## 2022-02-14 ENCOUNTER — Encounter
Admission: RE | Disposition: A | Discharge status: Home / Self Care | Payer: Self-pay | Source: Home / Self Care | Attending: Surgery

## 2022-02-14 ENCOUNTER — Ambulatory Visit
Admission: RE | Admit: 2022-02-14 | Discharge: 2022-02-14 | Disposition: A | Discharge status: Home / Self Care | Payer: Medicare Other | Attending: Surgery | Admitting: Surgery

## 2022-02-14 ENCOUNTER — Ambulatory Visit: Payer: Medicare Other | Admitting: Anesthesiology

## 2022-02-14 ENCOUNTER — Encounter: Payer: Self-pay | Admitting: Surgery

## 2022-02-14 ENCOUNTER — Other Ambulatory Visit: Payer: Self-pay

## 2022-02-14 DIAGNOSIS — E1142 Type 2 diabetes mellitus with diabetic polyneuropathy: Secondary | ICD-10-CM | POA: Insufficient documentation

## 2022-02-14 DIAGNOSIS — F418 Other specified anxiety disorders: Secondary | ICD-10-CM | POA: Diagnosis not present

## 2022-02-14 DIAGNOSIS — M75112 Incomplete rotator cuff tear or rupture of left shoulder, not specified as traumatic: Secondary | ICD-10-CM | POA: Diagnosis not present

## 2022-02-14 DIAGNOSIS — E118 Type 2 diabetes mellitus with unspecified complications: Secondary | ICD-10-CM

## 2022-02-14 DIAGNOSIS — Z6841 Body Mass Index (BMI) 40.0 and over, adult: Secondary | ICD-10-CM | POA: Diagnosis not present

## 2022-02-14 DIAGNOSIS — Z87891 Personal history of nicotine dependence: Secondary | ICD-10-CM | POA: Insufficient documentation

## 2022-02-14 DIAGNOSIS — I10 Essential (primary) hypertension: Secondary | ICD-10-CM | POA: Diagnosis not present

## 2022-02-14 DIAGNOSIS — Z7902 Long term (current) use of antithrombotics/antiplatelets: Secondary | ICD-10-CM | POA: Insufficient documentation

## 2022-02-14 DIAGNOSIS — I2510 Atherosclerotic heart disease of native coronary artery without angina pectoris: Secondary | ICD-10-CM | POA: Insufficient documentation

## 2022-02-14 DIAGNOSIS — M25812 Other specified joint disorders, left shoulder: Secondary | ICD-10-CM | POA: Insufficient documentation

## 2022-02-14 HISTORY — PX: SHOULDER ARTHROSCOPY WITH SUBACROMIAL DECOMPRESSION, ROTATOR CUFF REPAIR AND BICEP TENDON REPAIR: SHX5687

## 2022-02-14 LAB — GLUCOSE, CAPILLARY
Glucose-Capillary: 137 mg/dL — ABNORMAL HIGH (ref 70–99)
Glucose-Capillary: 97 mg/dL (ref 70–99)

## 2022-02-14 SURGERY — SHOULDER ARTHROSCOPY WITH SUBACROMIAL DECOMPRESSION, ROTATOR CUFF REPAIR AND BICEP TENDON REPAIR
Anesthesia: Regional | Site: Shoulder | Laterality: Left

## 2022-02-14 MED ORDER — FAMOTIDINE 20 MG PO TABS
ORAL_TABLET | ORAL | Status: AC
  Administered 2022-02-14: 20 mg via ORAL
  Filled 2022-02-14: qty 1

## 2022-02-14 MED ORDER — FENTANYL CITRATE (PF) 100 MCG/2ML IJ SOLN
INTRAMUSCULAR | Status: DC | PRN
  Administered 2022-02-14 (×2): 50 ug via INTRAVENOUS

## 2022-02-14 MED ORDER — VANCOMYCIN HCL IN DEXTROSE 1-5 GM/200ML-% IV SOLN
INTRAVENOUS | Status: AC
  Filled 2022-02-14: qty 200

## 2022-02-14 MED ORDER — CHLORHEXIDINE GLUCONATE 0.12 % MT SOLN
OROMUCOSAL | Status: AC
  Administered 2022-02-14: 15 mL via OROMUCOSAL
  Filled 2022-02-14: qty 15

## 2022-02-14 MED ORDER — EPINEPHRINE PF 1 MG/ML IJ SOLN
INTRAMUSCULAR | Status: AC
  Filled 2022-02-14: qty 1

## 2022-02-14 MED ORDER — ONDANSETRON HCL 4 MG/2ML IJ SOLN: 4.0000 mg | Freq: Once | INTRAMUSCULAR | Status: DC | PRN

## 2022-02-14 MED ORDER — BUPIVACAINE LIPOSOME 1.3 % IJ SUSP
INTRAMUSCULAR | Status: DC | PRN
  Administered 2022-02-14: 20 mL

## 2022-02-14 MED ORDER — PROPOFOL 10 MG/ML IV BOLUS
INTRAVENOUS | Status: DC | PRN
  Administered 2022-02-14: 10 mg via INTRAVENOUS
  Administered 2022-02-14: 50 mg via INTRAVENOUS
  Administered 2022-02-14: 200 mg via INTRAVENOUS

## 2022-02-14 MED ORDER — PHENYLEPHRINE HCL (PRESSORS) 10 MG/ML IV SOLN
INTRAVENOUS | Status: AC
  Filled 2022-02-14: qty 2

## 2022-02-14 MED ORDER — ONDANSETRON HCL 4 MG/2ML IJ SOLN
INTRAMUSCULAR | Status: AC
  Filled 2022-02-14: qty 2

## 2022-02-14 MED ORDER — LACTATED RINGERS IV SOLN: INTRAVENOUS | Status: DC | PRN

## 2022-02-14 MED ORDER — BUPIVACAINE HCL (PF) 0.5 % IJ SOLN
INTRAMUSCULAR | Status: AC
  Filled 2022-02-14: qty 10

## 2022-02-14 MED ORDER — PROPOFOL 10 MG/ML IV BOLUS
INTRAVENOUS | Status: AC
  Filled 2022-02-14: qty 20

## 2022-02-14 MED ORDER — SUGAMMADEX SODIUM 500 MG/5ML IV SOLN
INTRAVENOUS | Status: DC | PRN
  Administered 2022-02-14: 400 mg via INTRAVENOUS

## 2022-02-14 MED ORDER — ACETAMINOPHEN 10 MG/ML IV SOLN
INTRAVENOUS | Status: AC
  Filled 2022-02-14: qty 100

## 2022-02-14 MED ORDER — RINGERS IRRIGATION IR SOLN
Status: DC | PRN
  Administered 2022-02-14: 4000 mL

## 2022-02-14 MED ORDER — BUPIVACAINE HCL (PF) 0.5 % IJ SOLN
INTRAMUSCULAR | Status: DC | PRN
  Administered 2022-02-14: 10 mL

## 2022-02-14 MED ORDER — BUPIVACAINE-EPINEPHRINE 0.5% -1:200000 IJ SOLN
INTRAMUSCULAR | Status: DC | PRN
  Administered 2022-02-14: 30 mL

## 2022-02-14 MED ORDER — OXYCODONE HCL 5 MG PO TABS: 5.0000 mg | ORAL_TABLET | Freq: Once | ORAL | Status: DC | PRN

## 2022-02-14 MED ORDER — FENTANYL CITRATE PF 50 MCG/ML IJ SOSY: 50.0000 ug | PREFILLED_SYRINGE | Freq: Once | INTRAMUSCULAR | Status: AC

## 2022-02-14 MED ORDER — KETOROLAC TROMETHAMINE 15 MG/ML IJ SOLN
INTRAMUSCULAR | Status: AC
  Filled 2022-02-14: qty 1

## 2022-02-14 MED ORDER — DEXAMETHASONE SODIUM PHOSPHATE 10 MG/ML IJ SOLN
INTRAMUSCULAR | Status: AC
  Filled 2022-02-14: qty 1

## 2022-02-14 MED ORDER — SUGAMMADEX SODIUM 200 MG/2ML IV SOLN
INTRAVENOUS | Status: DC | PRN
  Administered 2022-02-14: 400 mg via INTRAVENOUS

## 2022-02-14 MED ORDER — IPRATROPIUM-ALBUTEROL 0.5-2.5 (3) MG/3ML IN SOLN: 3.0000 mL | RESPIRATORY_TRACT | Status: DC

## 2022-02-14 MED ORDER — OXYCODONE HCL 5 MG/5ML PO SOLN: 5.0000 mg | Freq: Once | ORAL | Status: DC | PRN

## 2022-02-14 MED ORDER — FENTANYL CITRATE PF 50 MCG/ML IJ SOSY
PREFILLED_SYRINGE | INTRAMUSCULAR | Status: AC
  Administered 2022-02-14: 50 ug via INTRAVENOUS
  Filled 2022-02-14: qty 1

## 2022-02-14 MED ORDER — OXYCODONE HCL 5 MG PO TABS: 5.0000 mg | ORAL_TABLET | ORAL | Status: DC | PRN

## 2022-02-14 MED ORDER — ACETAMINOPHEN 10 MG/ML IV SOLN
INTRAVENOUS | Status: DC | PRN
  Administered 2022-02-14: 1000 mg via INTRAVENOUS

## 2022-02-14 MED ORDER — BUPIVACAINE LIPOSOME 1.3 % IJ SUSP
INTRAMUSCULAR | Status: AC
  Filled 2022-02-14: qty 20

## 2022-02-14 MED ORDER — OXYCODONE HCL 5 MG PO TABS: 5.0000 mg | ORAL_TABLET | ORAL | 0 refills | Status: DC | PRN

## 2022-02-14 MED ORDER — EPHEDRINE 5 MG/ML INJ
INTRAVENOUS | Status: AC
  Filled 2022-02-14: qty 5

## 2022-02-14 MED ORDER — ONDANSETRON HCL 4 MG/2ML IJ SOLN
INTRAMUSCULAR | Status: DC | PRN
  Administered 2022-02-14: 4 mg via INTRAVENOUS

## 2022-02-14 MED ORDER — LACTATED RINGERS IV SOLN: INTRAVENOUS | Status: DC

## 2022-02-14 MED ORDER — IPRATROPIUM-ALBUTEROL 0.5-2.5 (3) MG/3ML IN SOLN
RESPIRATORY_TRACT | Status: AC
  Administered 2022-02-14: 3 mL via RESPIRATORY_TRACT
  Filled 2022-02-14: qty 3

## 2022-02-14 MED ORDER — FENTANYL CITRATE (PF) 100 MCG/2ML IJ SOLN: 25.0000 ug | INTRAMUSCULAR | Status: DC | PRN

## 2022-02-14 MED ORDER — KETOROLAC TROMETHAMINE 15 MG/ML IJ SOLN
15.0000 mg | Freq: Once | INTRAMUSCULAR | Status: AC
  Administered 2022-02-14: 15 mg via INTRAVENOUS

## 2022-02-14 MED ORDER — METOCLOPRAMIDE HCL 5 MG/ML IJ SOLN: 5.0000 mg | Freq: Three times a day (TID) | INTRAMUSCULAR | Status: DC | PRN

## 2022-02-14 MED ORDER — EPHEDRINE SULFATE (PRESSORS) 50 MG/ML IJ SOLN
INTRAMUSCULAR | Status: DC | PRN
  Administered 2022-02-14 (×5): 5 mg via INTRAVENOUS

## 2022-02-14 MED ORDER — ONDANSETRON HCL 4 MG/2ML IJ SOLN: 4.0000 mg | Freq: Four times a day (QID) | INTRAMUSCULAR | Status: DC | PRN

## 2022-02-14 MED ORDER — ROCURONIUM BROMIDE 100 MG/10ML IV SOLN
INTRAVENOUS | Status: DC | PRN
  Administered 2022-02-14: 50 mg via INTRAVENOUS

## 2022-02-14 MED ORDER — DEXAMETHASONE SODIUM PHOSPHATE 10 MG/ML IJ SOLN
INTRAMUSCULAR | Status: DC | PRN
  Administered 2022-02-14: 4 mg via INTRAVENOUS

## 2022-02-14 MED ORDER — MIDAZOLAM HCL 2 MG/2ML IJ SOLN: 1.0000 mg | INTRAMUSCULAR | Status: DC | PRN

## 2022-02-14 MED ORDER — MIDAZOLAM HCL 2 MG/2ML IJ SOLN
INTRAMUSCULAR | Status: AC
  Administered 2022-02-14: 1 mg via INTRAVENOUS
  Filled 2022-02-14: qty 2

## 2022-02-14 MED ORDER — BUPIVACAINE-EPINEPHRINE (PF) 0.5% -1:200000 IJ SOLN
INTRAMUSCULAR | Status: AC
  Filled 2022-02-14: qty 30

## 2022-02-14 MED ORDER — SODIUM CHLORIDE 0.9 % IV SOLN: INTRAVENOUS | Status: DC

## 2022-02-14 MED ORDER — ROCURONIUM BROMIDE 10 MG/ML (PF) SYRINGE
PREFILLED_SYRINGE | INTRAVENOUS | Status: AC
  Filled 2022-02-14: qty 10

## 2022-02-14 MED ORDER — ACETAMINOPHEN 10 MG/ML IV SOLN: 1000.0000 mg | Freq: Once | INTRAVENOUS | Status: DC | PRN

## 2022-02-14 MED ORDER — METOCLOPRAMIDE HCL 10 MG PO TABS: 5.0000 mg | ORAL_TABLET | Freq: Three times a day (TID) | ORAL | Status: DC | PRN

## 2022-02-14 MED ORDER — FENTANYL CITRATE (PF) 100 MCG/2ML IJ SOLN
INTRAMUSCULAR | Status: AC
  Filled 2022-02-14: qty 2

## 2022-02-14 MED ORDER — ONDANSETRON HCL 4 MG PO TABS: 4.0000 mg | ORAL_TABLET | Freq: Four times a day (QID) | ORAL | Status: DC | PRN

## 2022-02-14 MED ORDER — PHENYLEPHRINE HCL-NACL 20-0.9 MG/250ML-% IV SOLN
INTRAVENOUS | Status: DC | PRN
  Administered 2022-02-14: 50 ug/min via INTRAVENOUS

## 2022-02-14 SURGICAL SUPPLY — 53 items
ANCHOR BONE REGENETEN (Anchor) IMPLANT
ANCHOR SUT JK SZ 2 2.9 DBL SL (Anchor) IMPLANT
ANCHOR TENDON REGENETEN (Staple) IMPLANT
BIT DRILL JUGRKNT W/NDL BIT2.9 (DRILL) IMPLANT
BLADE FULL RADIUS 3.5 (BLADE) ×1 IMPLANT
BUR ACROMIONIZER 4.0 (BURR) ×1 IMPLANT
CANNULA SHAVER 8MMX76MM (CANNULA) ×1 IMPLANT
CHLORAPREP W/TINT 26 (MISCELLANEOUS) ×1 IMPLANT
COVER MAYO STAND REUSABLE (DRAPES) ×1 IMPLANT
DILATOR 5.5 THREADED HEALICOIL (MISCELLANEOUS) IMPLANT
DRILL JUGGERKNOT W/NDL BIT 2.9 (DRILL) ×2
ELECT CAUTERY BLADE 6.4 (BLADE) ×1 IMPLANT
ELECT REM PT RETURN 9FT ADLT (ELECTROSURGICAL) ×1
ELECTRODE REM PT RTRN 9FT ADLT (ELECTROSURGICAL) ×1 IMPLANT
GAUZE SPONGE 4X4 12PLY STRL (GAUZE/BANDAGES/DRESSINGS) ×1 IMPLANT
GAUZE XEROFORM 1X8 LF (GAUZE/BANDAGES/DRESSINGS) ×1 IMPLANT
GLOVE BIO SURGEON STRL SZ7.5 (GLOVE) ×2 IMPLANT
GLOVE BIO SURGEON STRL SZ8 (GLOVE) ×2 IMPLANT
GLOVE BIOGEL PI IND STRL 8 (GLOVE) ×1 IMPLANT
GLOVE SURG UNDER LTX SZ8 (GLOVE) ×1 IMPLANT
GOWN STRL REUS W/ TWL LRG LVL3 (GOWN DISPOSABLE) ×1 IMPLANT
GOWN STRL REUS W/ TWL XL LVL3 (GOWN DISPOSABLE) ×1 IMPLANT
GOWN STRL REUS W/TWL LRG LVL3 (GOWN DISPOSABLE) ×1
GOWN STRL REUS W/TWL XL LVL3 (GOWN DISPOSABLE) ×1
GRASPER SUT 15 45D LOW PRO (SUTURE) IMPLANT
IMPL REGENETEN MEDIUM (Shoulder) IMPLANT
IMPLANT REGENETEN MEDIUM (Shoulder) ×1 IMPLANT
IV LACTATED RINGER IRRG 3000ML (IV SOLUTION) ×2
IV LR IRRIG 3000ML ARTHROMATIC (IV SOLUTION) ×2 IMPLANT
KIT CANNULA 8X76-LX IN CANNULA (CANNULA) IMPLANT
MANIFOLD NEPTUNE II (INSTRUMENTS) ×2 IMPLANT
MASK FACE SPIDER DISP (MASK) ×1 IMPLANT
MAT ABSORB  FLUID 56X50 GRAY (MISCELLANEOUS) ×1
MAT ABSORB FLUID 56X50 GRAY (MISCELLANEOUS) ×1 IMPLANT
PACK ARTHROSCOPY SHOULDER (MISCELLANEOUS) ×1 IMPLANT
PAD ABD DERMACEA PRESS 5X9 (GAUZE/BANDAGES/DRESSINGS) ×2 IMPLANT
PASSER SUT FIRSTPASS SELF (INSTRUMENTS) IMPLANT
SLEEVE REMOTE CONTROL 5X12 (DRAPES) IMPLANT
SLING ARM LRG DEEP (SOFTGOODS) ×1 IMPLANT
SLING ULTRA II LG (MISCELLANEOUS) ×1 IMPLANT
SPONGE T-LAP 18X18 ~~LOC~~+RFID (SPONGE) ×1 IMPLANT
STAPLER SKIN PROX 35W (STAPLE) ×1 IMPLANT
STRAP SAFETY 5IN WIDE (MISCELLANEOUS) ×1 IMPLANT
SUT ETHIBOND 0 MO6 C/R (SUTURE) ×1 IMPLANT
SUT ULTRABRAID 2 COBRAID 38 (SUTURE) IMPLANT
SUT VIC AB 2-0 CT1 27 (SUTURE) ×2
SUT VIC AB 2-0 CT1 TAPERPNT 27 (SUTURE) ×2 IMPLANT
TAPE MICROFOAM 4IN (TAPE) ×1 IMPLANT
TRAP FLUID SMOKE EVACUATOR (MISCELLANEOUS) ×1 IMPLANT
TUBING CONNECTING 10 (TUBING) ×1 IMPLANT
TUBING INFLOW SET DBFLO PUMP (TUBING) ×1 IMPLANT
WAND WEREWOLF FLOW 90D (MISCELLANEOUS) ×1 IMPLANT
WATER STERILE IRR 500ML POUR (IV SOLUTION) ×1 IMPLANT

## 2022-02-14 NOTE — Anesthesia Preprocedure Evaluation (Addendum)
Anesthesia Evaluation  Patient identified by MRN, date of birth, ID band Patient awake    Reviewed: Allergy & Precautions, NPO status , Patient's Chart, lab work & pertinent test results  History of Anesthesia Complications Negative for: history of anesthetic complications  Airway Mallampati: IV   Neck ROM: Full    Dental  (+) Upper Dentures   Pulmonary sleep apnea , former smoker (quit 2018)   Pulmonary exam normal breath sounds clear to auscultation       Cardiovascular hypertension, + CAD (on Plavix) and + Peripheral Vascular Disease  Normal cardiovascular exam Rhythm:Regular Rate:Normal  ECG 02/08/22:  Sinus bradycardia (HR 58) Otherwise normal ECG  Myocardial perfusion 09/06/19: Normal myocardial perfusion without evidence of myocardial ischemia  Echo 09/01/19:  NORMAL LEFT VENTRICULAR SYSTOLIC FUNCTION   WITH MILD LVH  NORMAL RIGHT VENTRICULAR SYSTOLIC FUNCTION  MILD VALVULAR REGURGITATION  NO VALVULAR STENOSIS    Neuro/Psych  Headaches PSYCHIATRIC DISORDERS Anxiety Depression     Neuromuscular disease (diabetic polyneuropathy)    GI/Hepatic negative GI ROS,,,  Endo/Other  diabetes, Type 2Hypothyroidism  Class 3 obesity  Renal/GU negative Renal ROS     Musculoskeletal   Abdominal   Peds  Hematology  (+) Blood dyscrasia, anemia   Anesthesia Other Findings Cardiology note 10/31/21:  Plan  -There has been a discussion of the current guidelines for hypertension control. We will continue current medical regimen for hypertension control which will also help in risk factor modification of cardiovascular disease. The patient understands and agrees with the current plan. We will be watching for possible future side effects of these medications. Additional home blood pressure monitoring is recommended if able. -The patient understands all risks of future cardiovascular disease process and possible signs or symptoms of  progression based on discussion today. We will continue all risk factor modification and prevention measures including lipid management, exercise and diet, blood pressure control, and anti-platelet medication management as tolerated. - continue anti-platelet medication management for carotid atherosclerosis and aortic atherosclerosis at this time. Patient understands risk and benefits of this medication management which includes reducing vascular morbidity and mortality in the future. They understand the risks of this treatment including bleeding and possible bruising as well. Other cardiovascular risk management will also be continued including blood pressure control, lipid treatment, and lifestyle changes. -The patient has previously tried using a CPAP machine for which the patient has had significant side effects of that CPAP machine. They currently are unable to continue the use of that CPAP machine for improvements of sleep apnea and potential long-term risk reduction of congestive heart failure, shortness of breath, and rhythm disturbances. The patient understands the potential future consequences of untreated sleep apnea. We will continue to consider its treatment in the future. -Continue aggressive medical management of diabetes following the ABCs for prevention of cardiovascular disease and complications. The goals set forth include a goal HbA1c of less than 7, moderate to high intensity statin use if patient can tolerate, and a goal systolic blood pressure of below 146m.  -We have discussed a diet and lifestyle program to improve quality of life measures and cardiovascular risk reduction. This includes a diet rich in fruits, vegetables, fiber, and nuts with lower amounts of saturated fats. The patient was also instructed to do regular daily activity to help slowly burn calories. We have discussed the possibility of a exercise prescription as well. The patient is advised to begin this activity and  watch for improvements over the next few months. -The patient  understands the risks and benefits of medication management for cardiovascular risk factors including lipid management. We plan on continuing to reduce cardiovascular risk and cardiovascular disease process by lowering LDL between 30% and 50% if achievable. We have discussed other lifestyle measures to help with this risk reduction as well.  No orders of the defined types were placed in this encounter.  Return if symptoms worsen or fail to improve.    Reproductive/Obstetrics                             Anesthesia Physical Anesthesia Plan  ASA: 3  Anesthesia Plan: General and Regional   Post-op Pain Management: Regional block*   Induction: Intravenous  PONV Risk Score and Plan: 3 and Ondansetron, Dexamethasone and Treatment may vary due to age or medical condition  Airway Management Planned: Oral ETT  Additional Equipment:   Intra-op Plan:   Post-operative Plan: Extubation in OR  Informed Consent: I have reviewed the patients History and Physical, chart, labs and discussed the procedure including the risks, benefits and alternatives for the proposed anesthesia with the patient or authorized representative who has indicated his/her understanding and acceptance.     Dental advisory given  Plan Discussed with: CRNA  Anesthesia Plan Comments: (Plan for preoperative interscalene nerve block and GETA.  Patient consented for risks of anesthesia including but not limited to:  - adverse reactions to medications - damage to eyes, teeth, lips or other oral mucosa - nerve damage due to positioning  - sore throat or hoarseness - damage to heart, brain, nerves, lungs, other parts of body or loss of life  Informed patient about role of CRNA in peri- and intra-operative care.  Patient voiced understanding.)        Anesthesia Quick Evaluation

## 2022-02-14 NOTE — Anesthesia Postprocedure Evaluation (Signed)
Anesthesia Post Note  Patient: Taylor [name]  Procedure(s) Performed: SHOULDER ARTHROSCOPY WITH DEBRIDEMENT, DECOMPRESSION, ROTATOR CUFF REPAIR AND BICEPS TENODESIS. (Left: Shoulder)  Patient location during evaluation: PACU Anesthesia Type: Regional Level of consciousness: awake and alert Pain management: pain level controlled Vital Signs Assessment: post-procedure vital signs reviewed and stable Respiratory status: spontaneous breathing, nonlabored ventilation, patient connected to nasal cannula oxygen and respiratory function unstable Cardiovascular status: blood pressure returned to baseline and stable Postop Assessment: no apparent nausea or vomiting Anesthetic complications: no Comments: Patient breathing spontaneously with a nasal canula with a saturation at 92. Attempts to wean oxygen were made but her saturations fell into the 80s. Patient stated she felt normal and didn't feel more difficult to breathe than normal. Stated that she has not been diagnosed with lung problems but she does feel short of breath at home sometimes. When this happens she stated she uses her husband's oxygen that he is prescribed. States that she is also supposed to be using a CPAP but is not using it. Dr. Roland Rack made the decision that the patient should stay for observation. The patient refused and signed AMA. Discussed with the patient that she needs to head to the ED if she has any symptoms of shortness or breath, fatigue, confusion, or weakness. She stated she understood and agreed.  No notable events documented.   Last Vitals:  Vitals:   02/14/22 1830 02/14/22 1845  BP: (!) 99/52 (!) 92/57  Pulse:    Resp:    Temp:    SpO2:      Last Pain:  Vitals:   02/14/22 1715  TempSrc:   PainSc: 0-No pain                 Dimas Millin

## 2022-02-14 NOTE — H&P (Signed)
History of Present Illness: Taylor [name] is a 61 y.o. who presents today for history and physical. She is to undergo a left shoulder subacromial decompression, rotator cuff repair and bicep tenodesis. Since her last visit in the clinic there have been no change in her condition. She wishes to proceed with surgery of the left shoulder.  The patient's symptoms began 10 months ago and developed without any specific cause or injury. She relates that timing of the onset of symptoms to getting a B12 shot . The patient's symptoms worsened when her 100 pound puppy bumped into a her while she was trying to clean off his pause and caused her to fall onto the left shoulder. She saw Cassell Smiles, PA-C, who gave her a steroid injection which provided temporary relief, lasting several weeks before her symptoms recurred. The patient also was given a series of home exercises to do which provided little if any relief of her symptoms. Because of continued symptoms, the patient was sent for an MRI scan, then referred to me for further evaluation and treatment. The patient describes the symptoms as moderate (patient is active but has had to make modifications or give up activities) and have the quality of being aching, miserable, nagging, stabbing, tender, and throbbing. The pain is localized to the lateral arm/shoulder. These symptoms are aggravated with normal daily activities, with sleeping, carrying heavy objects, at higher levels of activity, with overhead activity, and reaching behind the back. She has tried acetaminophen and non-steroidal anti-inflammatories (Aleve and Celebrex ) with limited benefit. She has tried rest , ice , and heat with limited benefit. She has tried the 1 injection described above with limited benefit. The patient is right-hand dominant. The patient has a history of cervical spondylosis. She denies any neck pain on today's visit, but does note occasional numbness and paresthesias down her arm to her hand.    Past Medical History: Anemia  Back pain  Chickenpox  Diabetes mellitus type 2, uncomplicated (CMS-HCC)  Gestational  Esophageal spasm  Hx of migraine headaches  Hyperlipidemia  Hypertension  Psoriasis  Thyroid nodule (thyroid ultrasound 03/2014)   Past Surgical History: COLONOSCOPY 09/17/2013 (Indic: Screening. 1 Tubular Adenoma)  COLONOSCOPY 08/13/2018 (Indic: Diarrhea. Descending diverticulitis. Random bx - normal colonic mucosa. PHx polyps - CBF 08/2023)  EGD 08/13/2018 (Indic: Dysphagia, GERD. Normal esophagus. Normal stomach (bx - reactive foveolar hyperplasia, fibrosis, and mixed inflammation c/w tissue adjacent to erosion; minimal chronic gastritis). Duodenitis in 2nd portion of duodenum (path - normal duodenal mucosa))  Left knee arthroscopy, partial medial meniscectomy, and chondroplasty 05/16/2021 (Dr Marry Guan)  back surgery  BUNION CORRECTION  CESAREAN SECTION  ENDOSCOPIC CARPAL TUNNEL RELEASE Right  HYSTERECTOMY  RIGHT LONG TRIGGER FINGER RELEASE  RIGHT TRIGGER THUMB RELEASE  Tendon surgery  TRIGGER FINGER RELEASE   Past Family History: Breast cancer Mother  Bone cancer Mother  Hyperlipidemia (Elevated cholesterol) Mother  High blood pressure (Hypertension) Mother  Diabetes type II Mother  Deep vein thrombosis (DVT or abnormal blood clot formation) Father  Myocardial Infarction (Heart attack) Father   Medications: bisoproloL-hydroCHLOROthiazide (ZIAC) 10-6.25 mg tablet Take 1 tablet by mouth once daily 90 tablet 3  celecoxib (CELEBREX) 100 MG capsule Take 100 mg by mouth 2 (two) times daily  clonazePAM (KLONOPIN) 0.5 MG tablet TAKE 1 TABLET(0.5 MG) BY MOUTH TWICE DAILY 180 tablet 0  clopidogreL (PLAVIX) 75 mg tablet TAKE 1 TABLET(75 MG) BY MOUTH EVERY DAY 90 tablet 3  cyanocobalamin (VITAMIN B12) 1000 MCG tablet Take 1 tablet (  1,000 mcg total) by mouth once daily 90 tablet 3  ergocalciferol, vitamin D2, 1,250 mcg (50,000 unit) capsule Take 1 capsule (50,000  Units total) by mouth once a week for 30 days 12 capsule 3  escitalopram oxalate (LEXAPRO) 20 MG tablet Take 1 tablet (20 mg total) by mouth once daily 90 tablet 3  glimepiride (AMARYL) 4 MG tablet Take 1 tablet (4 mg total) by mouth daily with breakfast 90 tablet 1  levothyroxine (SYNTHROID) 50 MCG tablet Take 1.5 tablets (75 mcg total) by mouth once daily Take on an empty stomach with a glass of water at least 30-60 minutes before breakfast. 135 tablet 1  naproxen sodium 220 mg Cap Take 440 mg by mouth every 6 (six) hours as needed  omeprazole (PRILOSEC) 20 MG DR capsule Take 1 capsule (20 mg total) by mouth once daily as needed Take 30 minutes before breakfast. (Patient not taking: Reported on 02/11/2022)  pregabalin (LYRICA) 100 MG capsule Take 1 capsule (100 mg total) by mouth 2 (two) times daily for 30 days 60 capsule 5  rosuvastatin (CRESTOR) 40 MG tablet Take 1 tablet (40 mg total) by mouth once daily 90 tablet 3  TALTZ AUTOINJECTOR, 2 PACK, 80 mg/mL AtIn Inject 80 mg subcutaneously Twice monthly  topiramate (TOPAMAX) 50 MG tablet Take 1 tablet (50 mg total) by mouth 2 (two) times daily 60 tablet 3  triamcinolone 0.1 % cream Apply topically 2 (two) times daily 60 g 5   Current Facility-Administered Medications Ordered in Epic:  cyanocobalamin (VITAMIN B12) injection 1,000 mcg 1,000 mcg Intramuscular Q30 Days Idelle Crouch, MD 1,000 mcg at 01/11/22 1410   Allergies: Aspirin Hives  Penicillin G Hives   Review of Systems A comprehensive 14 point ROS was performed, reviewed, and the pertinent orthopaedic findings are documented in the HPI.  Physical Exam: BP 116/70 (BP Location: Right upper arm, Patient Position: Sitting, BP Cuff Size: Large Adult)  Ht 165.1 cm ('5\' 5"'$ )  Wt (!) 115.7 kg (255 lb)  LMP (LMP Unknown) Comment: Hysterectomy  BMI 42.43 kg/m   General: Well-developed well-nourished female seen in no acute distress.   HEENT: Atraumatic,normocephalic. Pupils are equal  and reactive to light. Oropharynx is clear with moist mucosa  Lungs: Clear to auscultation bilaterally   Cardiovascular: Regular rate and rhythm. Normal S1, S2. No murmurs. No appreciable gallops or rubs. Peripheral pulses are palpable.  Abdomen: Soft, non-tender, nondistended. Bowel sounds present  Left shoulder exam: SKIN: normal SWELLING: none WARMTH: none LYMPH NODES: no adenopathy palpable CREPITUS: none TENDERNESS: Mildly tender along lateral acromion ROM (active):  Forward flexion: 95 degrees Abduction: 80 degrees Internal rotation: L4 ROM (passive):  Forward flexion: 135 degrees Abduction: 125 degrees ER/IR at 90 abd: 80 degrees / 50 degrees  She notes moderate pain at the extremes of all motions.  STRENGTH: Forward flexion: 4/5 Abduction: 4/5 External rotation: 4-4+/5 Internal rotation: 4-4+/5 Pain with RC testing: Moderate pain with resisted abduction more so than with resisted forward flexion  STABILITY: Normal  SPECIAL TESTS: Luan Pulling' test: positive, mild Speed's test: Equivocally positive Capsulitis - pain w/ passive ER: no Crossed arm test: Minimally positive Crank: Not evaluated Anterior apprehension: Negative Posterior apprehension: Not evaluated  Neurological: The patient is alert and oriented Sensation to light touch appears to be intact and within normal limits Gross motor strength appeared to be equal to 5/5  Vascular : Peripheral pulses felt to be palpable. Capillary refill appears to be intact and within normal limits  Imaging: X-rays taken  in [facility] of the left shoulder demonstrate no evidence for fractures, lytic lesions, or significant degenerative changes. The subacromial space is mildly decreased. There is no subacromial or infra-clavicular spurring. She demonstrates a Type II acromion.  Left Shoulder Imaging, MRI: MRI Shoulder Cartilage: No cartilage abnormality. MRI Shoulder Rotator Cuff: Impingement/tendinopathy with  possible partial-thickness tear involving anterior insertional fibers of supraspinatus tendon. No retraction. MRI Shoulder Labrum / Biceps: No labral tear or biceps abormality. MRI Shoulder Bone: Normal bone.  Impression: 1. Rotator cuff tendinitis left shoulder 2. Tendinitis upper bicep tendon left shoulder  Plan:  The treatment options were discussed with the patient and her husband. In addition, patient educational materials were provided regarding the diagnosis and treatment options. The patient is quite frustrated by her symptoms and functional limitations, and is ready to consider more aggressive treatment options. Therefore, I have recommended a surgical procedure, specifically a left shoulder arthroscopy with debridement, decompression, possible rotator cuff repair, and possible biceps tenodesis. The procedure was discussed with the patient, as were the potential risks (including bleeding, infection, nerve and/or blood vessel injury, persistent or recurrent pain, failure of the repair, progression of arthritis, need for further surgery, blood clots, strokes, heart attacks and/or arhythmias, pneumonia, etc.) and benefits. The patient states her understanding and wishes to proceed. All of the patient's questions and concerns were answered. She can call any time with further concerns. She will follow up post-surgery, routine.    H&P reviewed and patient re-examined. No changes.

## 2022-02-14 NOTE — Anesthesia Procedure Notes (Signed)
Anesthesia Regional Block: Interscalene brachial plexus block   Pre-Anesthetic Checklist: , timeout performed,  Correct Patient, Correct Site, Correct Laterality,  Correct Procedure,, site marked,  Risks and benefits discussed,  Surgical consent,  Pre-op evaluation,  At surgeon's request and post-op pain management  Laterality: Left  Prep: chloraprep       Needles:  Injection technique: Single-shot  Needle Type: Echogenic Needle          Additional Needles:   Procedures:,,,, ultrasound used (permanent image in chart),,   Motor weakness within 20 minutes.  Narrative:  Start time: 02/14/2022 12:31 PM End time: 02/14/2022 12:35 PM Injection made incrementally with aspirations every 5 mL.  Performed by: Personally  Anesthesiologist: Darrin Nipper, MD  Additional Notes: Functioning IV was confirmed and monitors applied.  Sterile prep and drape, hand hygiene and sterile gloves were used. Ultrasound guidance: relevant anatomy identified, needle position confirmed, local anesthetic spread visualized around nerve(s), vascular puncture avoided.  Image saved to electronic medical record.  Negative aspiration prior to incremental administration of local anesthetic for total 20 ml Exparel and 10 ml bupivacaine 0.5% given in interscalene distribution. The patient tolerated the procedure well. Vital signs and moderate sedation medications recorded in RN notes.

## 2022-02-14 NOTE — Progress Notes (Signed)
Patient in recovery, requiring oxygen to maintain oxygen saturations. Notified Dr. Roland Rack. Initially wanted to keep patient overnight, but patient stated she wanted to go home, has oxygen at home and ability to monitor oxygen with a pulse ox. She stated she will be sleeping upright and using IS. MD stated ok to go home if use oxygen tonight and monitor and if patient unable to hold saturations then to come to emergency room. Patient agreeable and patient discharged home.

## 2022-02-14 NOTE — Discharge Instructions (Addendum)
Orthopedic discharge instructions: Keep dressing dry and intact.  May shower after dressing changed on post-op day #4 (Monday).  Cover staples with Band-Aids after drying off. Apply ice frequently to shoulder. Take ibuprofen 600-800 mg TID with meals for 7-10 days, then as necessary. Take oxycodone as prescribed when needed.  May supplement with ES Tylenol if necessary. Keep shoulder immobilizer on at all times except may remove for bathing purposes. Follow-up in 10-14 days or as scheduled.  AMBULATORY SURGERY  DISCHARGE INSTRUCTIONS   The drugs that you were given will stay in your system until tomorrow so for the next 24 hours you should not:  Drive an automobile Make any legal decisions Drink any alcoholic beverage   You may resume regular meals tomorrow.  Today it is better to start with liquids and gradually work up to solid foods.  You may eat anything you prefer, but it is better to start with liquids, then soup and crackers, and gradually work up to solid foods.   Please notify your doctor immediately if you have any unusual bleeding, trouble breathing, redness and pain at the surgery site, drainage, fever, or pain not relieved by medication.    Additional Instructions:        Please contact your physician with any problems or Same Day Surgery at [phone], Monday through Friday 6 am to 4 pm, or [place] at  Main number at [phone]. 

## 2022-02-14 NOTE — Transfer of Care (Signed)
Immediate Anesthesia Transfer of Care Note  Patient: Taylor [name]  Procedure(s) Performed: SHOULDER ARTHROSCOPY WITH DEBRIDEMENT, DECOMPRESSION, ROTATOR CUFF REPAIR AND BICEPS TENODESIS. (Left: Shoulder)  Patient Location: PACU  Anesthesia Type:General  Level of Consciousness: awake, alert , and oriented  Airway & Oxygen Therapy: Patient Spontanous Breathing  Post-op Assessment: Report given to RN and Post -op Vital signs reviewed and stable  Post vital signs: Reviewed and stable  Last Vitals:  Vitals Value Taken Time  BP 117/64 02/14/22 1609  Temp    Pulse 71 02/14/22 1612  Resp 21 02/14/22 1612  SpO2 94 % 02/14/22 1612  Vitals shown include unvalidated device data.  Last Pain:  Vitals:   02/14/22 1144  TempSrc: Temporal  PainSc: 5          Complications: No notable events documented.

## 2022-02-14 NOTE — Op Note (Signed)
02/14/2022  3:54 PM  Patient:   Taylor [name]  Pre-Op Diagnosis:   Impingement/tendinopathy with partial-thickness rotator cuff tear and biceps tendinopathy, left shoulder.  Post-Op Diagnosis:   Impingement/tendinopathy with partial-thickness rotator cuff tear and biceps tendinopathy, left shoulder.  Procedure:   Limited arthroscopic debridement, arthroscopic subacromial decompression, mini-open rotator cuff repair using a [employer] Regeneten patch, and mini-open biceps tenodesis, left shoulder.  Anesthesia:   General endotracheal with interscalene block using Exparel placed preoperatively by the anesthesiologist.  Surgeon:   Pascal Lux, MD  Assistant:   Cameron Proud, PA-C  Findings:   As above. There was an articular sided partial-thickness rotator cuff tear involving the anterior insertional fibers of the supraspinatus tendon. The remainder of the rotator cuff was in satisfactory condition. The biceps tendon demonstrated moderate "lip sticking" without any partial or full-thickness tearing. The labrum was in satisfactory condition, as were the articular surfaces of both the glenoid and humerus.  Complications:   None  Fluids:   15 cc  Estimated blood loss:   900 cc  Tourniquet time:   None  Drains:   None  Closure:   Staples      Brief clinical note:   The patient is a 61 year old female with a history of progressively worsening left shoulder pain. The patient's symptoms have progressed despite medications, activity modification, etc. The patient's history and examination are consistent with impingement/tendinopathy with a probable rotator cuff tear. These findings were confirmed by MRI scan. The patient presents at this time for definitive management of these shoulder symptoms.  Procedure:   The patient underwent placement of an interscalene block by the anesthesiologist in the preoperative holding area before being brought into the operating room and lain in the supine  position. The patient then underwent general endotracheal intubation and anesthesia before being repositioned in the beach chair position using the beach chair positioner. The left shoulder and upper extremity were prepped with ChloraPrep solution before being draped sterilely. Preoperative antibiotics were administered. A timeout was performed to confirm the proper surgical site before the expected portal sites and incision site were injected with 0.5% Sensorcaine with epinephrine.   A posterior portal was created and the glenohumeral joint thoroughly inspected with the findings as described above. An anterior portal was created using an outside-in technique. The labrum and rotator cuff were further probed, again confirming the above-noted findings. The articular sided partial-thickness tear was debrided back to stable margins using the full-radius resector. Approximately 30 to 40% of the footprint was noted to be compromised. The ArthroCare wand was inserted and used to obtain hemostasis as well as to "anneal" the labrum superiorly and anteriorly. The instruments were removed from the joint after suctioning the excess fluid.  The camera was repositioned through the posterior portal into the subacromial space. A separate lateral portal was created using an outside-in technique. The 3.5 mm full-radius resector was introduced and used to perform a subtotal bursectomy. The ArthroCare wand was then inserted and used to remove the periosteal tissue off the undersurface of the anterior third of the acromion as well as to recess the coracoacromial ligament from its attachment along the anterior and lateral margins of the acromion. The 4.0 mm acromionizing bur was introduced and used to complete the decompression by removing the undersurface of the anterior third of the acromion. The full radius resector was reintroduced to remove any residual bony debris before the ArthroCare wand was reintroduced to obtain  hemostasis. The instruments were then  removed from the subacromial space after suctioning the excess fluid.  An approximately 4-5 cm incision was made over the anterolateral aspect of the shoulder beginning at the anterolateral corner of the acromion and extending distally in line with the bicipital groove. This incision was carried down through the subcutaneous tissues to expose the deltoid fascia. The raphae between the anterior and middle thirds was identified and this plane developed to provide access into the subacromial space. Additional bursal tissues were debrided sharply using Metzenbaum scissors. The rotator cuff was carefully inspected both visually and by palpation. The bursal surface of the supraspinatus tendon was in excellent condition. Therefore, it was elected to repair the partial-thickness tear utilizing a Plato patch. The medium sized patch was selected and applied over the site of the defect and secured using the appropriate bone and soft tissue staples.  The bicipital groove was identified by palpation and opened for 1-1.5 cm. The biceps tendon stump was retrieved through this defect. The floor of the bicipital groove was roughened with a curet before a Biomet 2.9 mm JuggerKnot anchor was inserted. Both sets of sutures were passed through the biceps tendon and tied securely to effect the tenodesis. The bicipital sheath was reapproximated using two #0 Ethibond interrupted sutures, incorporating the biceps tendon to further reinforce the tenodesis.  The wound was copiously irrigated with sterile saline solution before the deltoid raphae was reapproximated using 2-0 Vicryl interrupted sutures. The subcutaneous tissues were closed in two layers using 2-0 Vicryl interrupted sutures before the skin was closed using staples. The portal sites also were closed using staples. A sterile bulky dressing was applied to the shoulder before the arm was placed into a shoulder  immobilizer. The patient was then awakened, extubated, and returned to the recovery room in satisfactory condition after tolerating the procedure well.

## 2022-02-14 NOTE — Anesthesia Procedure Notes (Signed)
Procedure Name: Intubation Date/Time: 02/14/2022 2:26 PM  Performed by: Carmelina Paddock, RNPre-anesthesia Checklist: Patient identified, Emergency Drugs available, Suction available and Patient being monitored Patient Re-evaluated:Patient Re-evaluated prior to induction Oxygen Delivery Method: Circle system utilized Preoxygenation: Pre-oxygenation with 100% oxygen Induction Type: IV induction Ventilation: Mask ventilation without difficulty Laryngoscope Size: McGraph and 4 Grade View: Grade I Tube type: Oral Tube size: 7.0 mm Number of attempts: 1 Airway Equipment and Method: Stylet Placement Confirmation: ETT inserted through vocal cords under direct vision, positive ETCO2 and breath sounds checked- equal and bilateral Secured at: 20 cm Tube secured with: Tape Dental Injury: Teeth and Oropharynx as per pre-operative assessment

## 2022-02-15 ENCOUNTER — Encounter: Payer: Self-pay | Admitting: Surgery

## 2022-03-08 ENCOUNTER — Other Ambulatory Visit: Payer: Self-pay | Admitting: Internal Medicine

## 2022-03-08 DIAGNOSIS — R911 Solitary pulmonary nodule: Secondary | ICD-10-CM

## 2022-03-13 ENCOUNTER — Other Ambulatory Visit: Payer: Self-pay | Admitting: Physician Assistant

## 2022-03-13 ENCOUNTER — Ambulatory Visit: Admission: RE | Admit: 2022-03-13 | Payer: Medicare Other | Source: Ambulatory Visit

## 2022-03-13 DIAGNOSIS — M7989 Other specified soft tissue disorders: Secondary | ICD-10-CM

## 2022-03-14 ENCOUNTER — Ambulatory Visit
Admission: RE | Admit: 2022-03-14 | Discharge: 2022-03-14 | Disposition: A | Discharge status: Home / Self Care | Payer: Medicare Other | Source: Ambulatory Visit | Attending: Internal Medicine | Admitting: Internal Medicine

## 2022-03-14 DIAGNOSIS — R911 Solitary pulmonary nodule: Secondary | ICD-10-CM | POA: Insufficient documentation

## 2022-03-15 ENCOUNTER — Ambulatory Visit: Payer: Medicare Other | Admitting: Oncology

## 2022-03-15 ENCOUNTER — Other Ambulatory Visit: Payer: Medicare Other

## 2022-03-15 ENCOUNTER — Ambulatory Visit
Admission: RE | Admit: 2022-03-15 | Discharge: 2022-03-15 | Disposition: A | Discharge status: Home / Self Care | Payer: Medicare Other | Source: Ambulatory Visit | Attending: Physician Assistant | Admitting: Physician Assistant

## 2022-03-15 DIAGNOSIS — M7989 Other specified soft tissue disorders: Secondary | ICD-10-CM | POA: Insufficient documentation

## 2022-03-18 ENCOUNTER — Other Ambulatory Visit: Payer: Self-pay | Admitting: *Deleted

## 2022-03-18 ENCOUNTER — Inpatient Hospital Stay: Payer: Medicare Other

## 2022-03-18 ENCOUNTER — Inpatient Hospital Stay: Payer: Medicare Other | Attending: Oncology | Admitting: Oncology

## 2022-03-18 ENCOUNTER — Encounter: Payer: Self-pay | Admitting: Oncology

## 2022-03-18 VITALS — BP 126/83 | HR 85 | Temp 97.4°F | Resp 16 | Ht 65.0 in | Wt 251.8 lb

## 2022-03-18 DIAGNOSIS — D472 Monoclonal gammopathy: Secondary | ICD-10-CM | POA: Diagnosis not present

## 2022-03-18 DIAGNOSIS — Z87891 Personal history of nicotine dependence: Secondary | ICD-10-CM | POA: Diagnosis not present

## 2022-03-18 DIAGNOSIS — I10 Essential (primary) hypertension: Secondary | ICD-10-CM | POA: Insufficient documentation

## 2022-03-18 DIAGNOSIS — Z7902 Long term (current) use of antithrombotics/antiplatelets: Secondary | ICD-10-CM | POA: Diagnosis not present

## 2022-03-18 DIAGNOSIS — Z7984 Long term (current) use of oral hypoglycemic drugs: Secondary | ICD-10-CM | POA: Insufficient documentation

## 2022-03-18 DIAGNOSIS — Z79899 Other long term (current) drug therapy: Secondary | ICD-10-CM | POA: Diagnosis not present

## 2022-03-18 DIAGNOSIS — E039 Hypothyroidism, unspecified: Secondary | ICD-10-CM | POA: Diagnosis not present

## 2022-03-18 DIAGNOSIS — Z90710 Acquired absence of both cervix and uterus: Secondary | ICD-10-CM | POA: Diagnosis not present

## 2022-03-18 DIAGNOSIS — E119 Type 2 diabetes mellitus without complications: Secondary | ICD-10-CM | POA: Diagnosis not present

## 2022-03-18 LAB — CBC WITH DIFFERENTIAL/PLATELET
Abs Immature Granulocytes: 0.02 10*3/uL (ref 0.00–0.07)
Basophils Absolute: 0 10*3/uL (ref 0.0–0.1)
Basophils Relative: 1 %
Eosinophils Absolute: 0.4 10*3/uL (ref 0.0–0.5)
Eosinophils Relative: 9 %
HCT: 43.3 % (ref 36.0–46.0)
Hemoglobin: 14.6 g/dL (ref 12.0–15.0)
Immature Granulocytes: 0 %
Lymphocytes Relative: 27 %
Lymphs Abs: 1.4 10*3/uL (ref 0.7–4.0)
MCH: 32.3 pg (ref 26.0–34.0)
MCHC: 33.7 g/dL (ref 30.0–36.0)
MCV: 95.8 fL (ref 80.0–100.0)
Monocytes Absolute: 0.5 10*3/uL (ref 0.1–1.0)
Monocytes Relative: 9 %
Neutro Abs: 2.8 10*3/uL (ref 1.7–7.7)
Neutrophils Relative %: 54 %
Platelets: 195 10*3/uL (ref 150–400)
RBC: 4.52 MIL/uL (ref 3.87–5.11)
RDW: 14.9 % (ref 11.5–15.5)
WBC: 5.2 10*3/uL (ref 4.0–10.5)
nRBC: 0 % (ref 0.0–0.2)

## 2022-03-18 LAB — COMPREHENSIVE METABOLIC PANEL
ALT: 26 U/L (ref 0–44)
AST: 28 U/L (ref 15–41)
Albumin: 4.2 g/dL (ref 3.5–5.0)
Alkaline Phosphatase: 69 U/L (ref 38–126)
Anion gap: 8 (ref 5–15)
BUN: 12 mg/dL (ref 8–23)
CO2: 27 mmol/L (ref 22–32)
Calcium: 9.2 mg/dL (ref 8.9–10.3)
Chloride: 101 mmol/L (ref 98–111)
Creatinine, Ser: 0.68 mg/dL (ref 0.44–1.00)
GFR, Estimated: >60 mL/min (ref >60)
Glucose, Bld: 157 mg/dL — ABNORMAL HIGH (ref 70–99)
Potassium: 3.8 mmol/L (ref 3.5–5.1)
Sodium: 136 mmol/L (ref 135–145)
Total Bilirubin: 0.5 mg/dL (ref 0.3–1.2)
Total Protein: 7.3 g/dL (ref 6.5–8.1)

## 2022-03-18 NOTE — Progress Notes (Signed)
Hematology/Oncology Consult note [facility]  Telephone:([phone] [phone]  Patient Care Team: Idelle Crouch, MD as PCP - General (Internal Medicine) Doy Hutching Leonie Douglas, MD (Internal Medicine)   Name of the patient: Taylor [name]  [id number]  1960/12/04   Date of visit: 03/18/22  Diagnosis-IgG MGUS  Chief complaint/ Reason for visit-routine follow-up of MGUS  Heme/Onc history: Patient is a 62 year old female with a past medical history significant for psoriasis and psoriatic arthritis, type 2 diabetes was seen by neurology for evaluation of neuropathy.  As a part of the work-up she had SPEP done which showed IgG lambda monoclonal protein of 0.3 g.  Patient has had mildly elevated calcium in the past but most recent calcium was 9.3.  H&H normal at 14.6/44.3.  Renal functions and total protein normal.   Results of blood work from 02/05/2021 were as follows: CBC showed normal white count hemoglobin and platelets with an MCV of 100.7.  CMP was normal.  Myeloma panel showed no M protein on SPEP but immunofixation showed IgG lambda monoclonal protein.  Serum free light chain ratio normal at 1.25    Interval history-patient recently shoulder surgery a few weeks ago and is currently recovering from that.  Appetite and weight have remained stable.  Denies any new aches and pains anywhere.  ECOG PS- 0 Pain scale- 0   Review of systems- Review of Systems  Constitutional:  Negative for chills, fever, malaise/fatigue and weight loss.  HENT:  Negative for congestion, ear discharge and nosebleeds.   Eyes:  Negative for blurred vision.  Respiratory:  Negative for cough, hemoptysis, sputum production, shortness of breath and wheezing.   Cardiovascular:  Negative for chest pain, palpitations, orthopnea and claudication.  Gastrointestinal:  Negative for abdominal pain, blood in stool, constipation, diarrhea, heartburn, melena, nausea and vomiting.   Genitourinary:  Negative for dysuria, flank pain, frequency, hematuria and urgency.  Musculoskeletal:  Negative for back pain, joint pain and myalgias.  Skin:  Negative for rash.  Neurological:  Negative for dizziness, tingling, focal weakness, seizures, weakness and headaches.  Endo/Heme/Allergies:  Does not bruise/bleed easily.  Psychiatric/Behavioral:  Negative for depression and suicidal ideas. The patient does not have insomnia.       Allergies  Allergen Reactions   Aspirin Hives and Swelling   Penicillin G Hives   Penicillins Hives     Past Medical History:  Diagnosis Date   Anemia    Anxiety    a.) on BZO (clonazepam) PRN   Aortic atherosclerosis (HCC)    Arthritis    B12 deficiency    Bilateral carotid artery disease (HCC)    Chronic back pain    Coronary artery calcification seen on CT scan    Depression    Diastolic dysfunction 26/71/2458   a.) TTE 09/01/2019: >55%; mild LA enlargement; triv PR, mild MR/TR; G1DD.   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   OSA (obstructive sleep apnea)    a.) does not require nocturnal PAP therapy   Pneumonia    Psoriatic arthritis ([place])    a.) on Ixekizumab (Taltz)   Sensory polyneuropathy  T2DM (type 2 diabetes mellitus) ([place])      Past Surgical History:  Procedure Laterality Date   ABDOMINAL HYSTERECTOMY     BACK SURGERY     2 level fusion   BREAST BIOPSY Left 08/23/2013   stereo, negative   BUNIONECTOMY Bilateral    carpal tunnel Right    COLONOSCOPY WITH ESOPHAGOGASTRODUODENOSCOPY (EGD)     DILATION AND CURETTAGE OF UTERUS     KNEE ARTHROSCOPY Left 05/16/2021   Procedure: ARTHROSCOPY KNEE;  Surgeon: Dereck Leep, MD;  Location: [facility] OR[facility];  Service: Orthopedics;  Laterality: Left;   SHOULDER ARTHROSCOPY WITH SUBACROMIAL DECOMPRESSION, ROTATOR CUFF REPAIR AND BICEP TENDON REPAIR Left 02/14/2022   Procedure: SHOULDER ARTHROSCOPY WITH DEBRIDEMENT, DECOMPRESSION, ROTATOR CUFF REPAIR AND BICEPS TENODESIS.;  Surgeon: Corky Mull, MD;  Location: [facility];  Service: Orthopedics;  Laterality: Left;   tendinitis Right    wrist   TRIGGER FINGER RELEASE Right    x 2 thumb and middle finger    Social History   Socioeconomic History   Marital status: Married    Spouse name: Not on file   Number of children: Not on file   Years of education: Not on file   Highest education level: Not on file  Occupational History   Not on file  Tobacco Use   Smoking status: Former    Types: Cigarettes    Quit date: 03/11/2016    Years since quitting: 6.0   Smokeless tobacco: Never  Vaping Use   Vaping Use: Former  Substance and Sexual Activity   Alcohol use: Not Currently    Comment: occasional   Drug use: No   Sexual activity: Not Currently  Other Topics Concern   Not on file  Social History Narrative   Lives at home with husband   Social Determinants of Health   Financial Resource Strain: Not on file  Food Insecurity: Not on file  Transportation Needs: Not on file  Physical Activity: Not on file  Stress: Not on file  Social Connections: Not on file  Intimate Partner Violence: Not on file    Family History  Problem Relation Age of Onset   Breast cancer Mother 79     Current Outpatient Medications:    bisoprolol-hydrochlorothiazide (ZIAC) 10-6.25 MG tablet, Take 1 tablet by mouth daily., Disp: , Rfl:    clonazePAM (KLONOPIN) 0.5 MG tablet, Take 0.5 mg by mouth 2 (two) times daily as needed for anxiety., Disp: , Rfl:    clopidogrel (PLAVIX) 75 MG tablet, Take 75 mg by mouth daily., Disp: , Rfl:    Ergocalciferol (VITAMIN D2 PO), Take 50,000 Units by mouth once a week., Disp: , Rfl:     escitalopram (LEXAPRO) 20 MG tablet, Take 20 mg by mouth daily., Disp: , Rfl:    glimepiride (AMARYL) 4 MG tablet, Take 4 mg by mouth daily., Disp: , Rfl:    levothyroxine (SYNTHROID) 50 MCG tablet, Take 75 mcg by mouth daily before breakfast., Disp: , Rfl:    oxyCODONE (ROXICODONE) 5 MG immediate release tablet, Take 1-2 tablets (5-10 mg total) by mouth every 4 (four) hours as needed for moderate pain or severe pain., Disp: 40 tablet, Rfl: 0   pregabalin (LYRICA) 50 MG capsule, Take 50 mg by mouth 2 (two) times daily., Disp: , Rfl:    rosuvastatin (CRESTOR) 40 MG tablet, Take 40 mg by mouth daily., Disp: , Rfl:    TALTZ 80 MG/ML SOAJ, Inject 80 mg into the skin every 14 (  fourteen) days., Disp: , Rfl:    vitamin B-12 (CYANOCOBALAMIN) 1000 MCG tablet, Take 1,000 mcg by mouth every 30 (thirty) days. , Disp: , Rfl:    amitriptyline (ELAVIL) 10 MG tablet, Take 10 mg by mouth at bedtime as needed for sleep. (Patient not taking: Reported on 03/18/2022), Disp: , Rfl:   Physical exam:  Vitals:   03/18/22 0928  BP: 126/83  Pulse: 85  Resp: 16  Temp: (!) 97.4 F (36.3 C)  TempSrc: Tympanic  Weight: 251 lb 12.8 oz (114.2 kg)  Height: '5\' 5"'$  (1.651 m)   Physical Exam Cardiovascular:     Rate and Rhythm: Normal rate and regular rhythm.     Heart sounds: Normal heart sounds.  Pulmonary:     Effort: Pulmonary effort is normal.     Breath sounds: Normal breath sounds.  Abdominal:     General: Bowel sounds are normal.     Palpations: Abdomen is soft.  Musculoskeletal:     Comments: Brace in place over left upper extremity  Skin:    General: Skin is warm and dry.  Neurological:     Mental Status: She is alert and oriented to person, place, and time.         Latest Ref Rng & Units 03/18/2022    8:49 AM  CMP  Glucose 70 - 99 mg/dL 157   BUN 8 - 23 mg/dL 12   Creatinine 0.44 - 1.00 mg/dL 0.68   Sodium 135 - 145 mmol/L 136   Potassium 3.5 - 5.1 mmol/L 3.8   Chloride 98 - 111 mmol/L 101   CO2  22 - 32 mmol/L 27   Calcium 8.9 - 10.3 mg/dL 9.2   Total Protein 6.5 - 8.1 g/dL 7.3   Total Bilirubin 0.3 - 1.2 mg/dL 0.5   Alkaline Phos 38 - 126 U/L 69   AST 15 - 41 U/L 28   ALT 0 - 44 U/L 26       Latest Ref Rng & Units 03/18/2022    8:49 AM  CBC  WBC 4.0 - 10.5 K/uL 5.2   Hemoglobin 12.0 - 15.0 g/dL 14.6   Hematocrit 36.0 - 46.0 % 43.3   Platelets 150 - 400 K/uL 195     No images are attached to the encounter.  CT CHEST WO CONTRAST  Result Date: 03/15/2022 CLINICAL DATA:  Follow-up for lung nodule, noted on a chest radiograph dated 03/01/2022. EXAM: CT CHEST WITHOUT CONTRAST TECHNIQUE: Multidetector CT imaging of the chest was performed following the standard protocol without IV contrast. RADIATION DOSE REDUCTION: This exam was performed according to the departmental dose-optimization program which includes automated exposure control, adjustment of the mA and/or kV according to patient size and/or use of iterative reconstruction technique. COMPARISON:  Dictated report from the chest radiograph dated 03/01/2022. Previous chest CT dated 06/28/2019. FINDINGS: Cardiovascular: Heart normal in size and configuration. Mild left and right coronary artery calcifications. No pericardial effusion. Great vessels normal in caliber. Mild aortic atherosclerotic calcifications. Mediastinum/Nodes: No neck base, mediastinal or hilar masses. No enlarged lymph nodes. Trachea esophagus are unremarkable. Lungs/Pleura: No lung mass. There are small ill-defined peripheral nodules and focal ground-glass opacities in the right lung involving all lobes most prominently the right lower lobe ill-defined nodules measure up 4 mm in size. Peripheral ground-glass opacities measure up to 1 cm in size. Remainder of the right lung is clear. Left lung is clear. No pleural effusion or pneumothorax. Upper Abdomen: No acute findings. Small gallstones. Aortic  atherosclerosis. Musculoskeletal: No fracture or acute finding. No bone  lesion. No chest wall mass. IMPRESSION: 1. Small, 4 mm and less, peripheral right lung ill-defined nodules as well as peripheral right lung focal ground-glass opacities, not present previous CT. Appearance suggests an inflammatory/infectious etiology, including atypical etiologies such as viral/COVID-19. Non-contrast chest CT at 3-6 months is recommended. If nodules persist, subsequent management will be based upon the most suspicious nodule(s). This recommendation follows the consensus statement: Guidelines for Management of Incidental Pulmonary Nodules Detected on CT Images: From the Fleischner Society 2017; Radiology 2017; 284:228-243. 2. No lung masses.  No left lung nodules or consolidation. 3. Mild coronary artery calcifications and aortic atherosclerosis. Aortic Atherosclerosis (ICD10-I70.0). Electronically Signed   By: Lajean Manes M.D.   On: 03/15/2022 15:26   US Venous Img Lower Unilateral Left (DVT)  Result Date: 03/15/2022 CLINICAL DATA:  Left leg swelling for 2 weeks. EXAM: LEFT LOWER EXTREMITY VENOUS DOPPLER ULTRASOUND TECHNIQUE: Gray-scale sonography with compression, as well as color and duplex ultrasound, were performed to evaluate the deep venous system(s) from the level of the common femoral vein through the popliteal and proximal calf veins. COMPARISON:  Left lower extremity venous Doppler ultrasound 09/03/2021 FINDINGS: VENOUS Normal compressibility of the common femoral, superficial femoral, and popliteal veins, as well as the visualized calf veins. Visualized portions of profunda femoral vein and great saphenous vein unremarkable. No filling defects to suggest DVT on grayscale or color Doppler imaging. Doppler waveforms show normal direction of venous flow, normal respiratory plasticity and response to augmentation. Limited views of the contralateral common femoral vein are unremarkable. OTHER None. Limitations: none IMPRESSION: Negative. Electronically Signed   By: Yvonne Kendall M.D.    On: 03/15/2022 10:57     Assessment and plan- Patient is a 61 y.o. female Here for routine follow up of IgG MGUS  Myeloma panel and serum free light chains are currently pending.  She is not anemic and her hemoglobin has remained stable around 14.  Macrocytosis has resolved.  Creatinine is normal and total protein and serum calcium are normal.  I will continue to monitor her MGUS once a year.  Based on this labs and clinical symptoms there is no overt concern for progression to multiple myeloma at this time   Visit Diagnosis 1. MGUS (monoclonal gammopathy of unknown significance)      Dr. Randa Evens, MD, MPH [facility] [phone] 03/18/2022 12:53 PM

## 2022-03-19 LAB — KAPPA/LAMBDA LIGHT CHAINS
Kappa free light chain: 18 mg/L (ref 3.3–19.4)
Kappa, lambda light chain ratio: 1.13 (ref 0.26–1.65)
Lambda free light chains: 15.9 mg/L (ref 5.7–26.3)

## 2022-03-21 LAB — MULTIPLE MYELOMA PANEL, SERUM
Albumin SerPl Elph-Mcnc: 4.1 g/dL (ref 2.9–4.4)
Albumin/Glob SerPl: 1.5 (ref 0.7–1.7)
Alpha 1: 0.2 g/dL (ref 0.0–0.4)
Alpha2 Glob SerPl Elph-Mcnc: 0.7 g/dL (ref 0.4–1.0)
B-Globulin SerPl Elph-Mcnc: 1.1 g/dL (ref 0.7–1.3)
Gamma Glob SerPl Elph-Mcnc: 0.9 g/dL (ref 0.4–1.8)
Globulin, Total: 2.9 g/dL (ref 2.2–3.9)
IgA: 130 mg/dL (ref 87–352)
IgG (Immunoglobin G), Serum: 946 mg/dL (ref 586–1602)
IgM (Immunoglobulin M), Srm: 81 mg/dL (ref 26–217)
Total Protein ELP: 7 g/dL (ref 6.0–8.5)

## 2022-09-06 ENCOUNTER — Other Ambulatory Visit: Payer: Self-pay | Admitting: Internal Medicine

## 2022-09-06 DIAGNOSIS — Z1231 Encounter for screening mammogram for malignant neoplasm of breast: Secondary | ICD-10-CM

## 2022-11-22 ENCOUNTER — Ambulatory Visit
Admission: RE | Admit: 2022-11-22 | Discharge: 2022-11-22 | Disposition: A | Discharge status: Home / Self Care | Payer: Medicare Other | Source: Ambulatory Visit | Attending: Internal Medicine | Admitting: Internal Medicine

## 2022-11-22 DIAGNOSIS — Z1231 Encounter for screening mammogram for malignant neoplasm of breast: Secondary | ICD-10-CM | POA: Insufficient documentation

## 2023-01-24 ENCOUNTER — Other Ambulatory Visit: Payer: Self-pay | Admitting: Orthopedic Surgery

## 2023-01-24 DIAGNOSIS — M4802 Spinal stenosis, cervical region: Secondary | ICD-10-CM

## 2023-02-03 ENCOUNTER — Ambulatory Visit
Admission: RE | Admit: 2023-02-03 | Discharge: 2023-02-03 | Disposition: A | Discharge status: Home / Self Care | Payer: Medicare Other | Source: Ambulatory Visit | Attending: Orthopedic Surgery | Admitting: Orthopedic Surgery

## 2023-02-03 DIAGNOSIS — M4802 Spinal stenosis, cervical region: Secondary | ICD-10-CM

## 2023-02-18 ENCOUNTER — Other Ambulatory Visit: Payer: Medicare Other

## 2023-03-19 ENCOUNTER — Other Ambulatory Visit: Payer: Medicare Other

## 2023-03-19 ENCOUNTER — Ambulatory Visit: Payer: Medicare Other | Admitting: Oncology

## 2023-03-21 NOTE — Progress Notes (Signed)
 [facility] - Physiatry  PROCEDURE: 1. A Left C5-6 transforaminal epidural steroid injection under fluoroscopic guidance. 2. Use of fluoroscopic guidance was required to ensure adequate delivery of medication into the epidural space and for proper needle placement. 3. The patient was monitored with continuous pulse oximetry throughout the procedure.   DIAGNOSIS/INDICATION FOR PROCEDURE: The patient is a pleasant 63 year-old female followed by [facility] for acute on chronicchronic bilateral posterior cervical spine pain left greater than right to the bilateral trapezius ridge with intermittent pain in the arms and numbness in hands.  Clinically her symptoms most consistent with her cervical disc herniations and spinal stenosis and cervical radiculitis.   MRI from [facility] dated 02/03/2023 both images and report were reviewed today. COMPARISON:  Prior CT from 10/08/2013  FINDINGS:  Alignment: Straightening of the normal cervical lordosis. Trace  facet mediated anterolisthesis of C7 on T1.  Vertebrae: Vertebral body height maintained without acute or chronic  fracture. Bone marrow signal intensity within normal limits. Small  benign hemangioma noted within the C4 vertebral body. No worrisome  osseous lesions. No abnormal marrow edema.  Cord: Normal signal and morphology.  Posterior Fossa, vertebral arteries, paraspinal tissues:  Unremarkable.   Disc levels:  C2-C3: Negative interspace. Moderate left with mild right facet  hypertrophy. No spinal stenosis. Foramina remain patent.  C3-C4: Central to left paracentral disc protrusion indents the  ventral thecal sac (series 110, image 15). Superimposed endplate and  uncovertebral spurring with advanced left-sided facet arthrosis.  Mild spinal stenosis with severe left C4 foraminal narrowing. Right  neural foramen remains patent.  C4-C5: Shallow broad-based left paracentral disc osteophyte complex   flattens and partially indents the ventral thecal sac (series 110,  images 19, 20). Mild cord flattening without cord signal changes.  Mild spinal stenosis. Moderate bilateral C5 foraminal stenosis.  C5-C6: Mild degenerative intervertebral disc space narrowing with  diffuse circumferential disc osteophyte complex. Superimposed small  left paracentral disc protrusion indents the ventral thecal sac  (series 110, image 23). Minimal cord flattening without cord signal  changes. Mild spinal stenosis. Severe left with moderate right C6  foraminal narrowing.  C6-C7: Degenerative intervertebral disc space narrowing with  circumferential disc osteophyte complex. Flattening and partial  effacement of the ventral thecal sac with resultant mild spinal  stenosis. Mild to moderate left C7 foraminal stenosis. Right neural  foramen remains adequately patent.  C7-T1: Trace anterolisthesis. Minimal disc bulge. Moderate  left-sided facet hypertrophy. No spinal stenosis. Mild left C8  foraminal narrowing. Right neural foramen remains patent.  T1-2: Seen only on sagittal projection. Diffuse disc bulge with  reactive endplate spurring. No significant spinal stenosis. Mild to  moderate right worse than left foraminal stenosis.  T2-3: Seen only on sagittal projection. Mild disc bulge. No  significant spinal stenosis. Foramina appear patent.   IMPRESSION:  1. Multilevel cervical spondylosis with resultant mild diffuse  spinal stenosis at C3-4 through C6-7.  2. Multifactorial degenerative changes with resultant multilevel  foraminal narrowing as above. Notable findings include severe left  C4 foraminal stenosis, moderate bilateral C5 foraminal narrowing,  severe left with moderate right C6 foraminal stenosis, with mild to  moderate left C7 and T1-2 foraminal narrowing.   Electronically Signed    By: Morene Hoard M.D.    On: 02/08/2023 01:06   She is on Plavix  for CAD, we received clearance from  her cardiologist to hold Plavix  7 days prior to procedure.   She has diabetes mellitus, her hemoglobin  A1c on 12/11/2022 was 7.2.  Her pain is rated 6/10.  Procedures:  03/21/2023: Left C5-6 transforaminal ESI 10/21/2022: RFA to the left knee 04/03/2022: RFA to the left knee (90% relief x 6 months) 02/08/2022: Left genicular nerve block (10/10 to 0/10)  01/09/2022: Left genicular nerve block (10/10 to 0/10)   IMPRESSION/RESULTS: Patient tolerated the procedure well without any complications. We used a 3.5 inch needle. Immediately after the procedure she rated her pain 1/10. She will follow up with [facility] in 3-4 weeks.      INFORMED CONSENT: The patient understood the potential risks and benefits of the procedure, which were explained to the patient prior to the procedure.  The patient read and signed the consent form stating complete understanding of the information, and wished to proceed with the procedure. There were no barriers to understanding.  Ample time was given for any questions to be answered prior to the procedure.  The risks of the procedure were explained including, but not limited to the risk of bleeding and/or infection into the spinal area, intervertebral discs, or epidural space; reaction to medications, nerve injury; nerve irritation; lung collapse; and even paralysis or death.  The patient denied any history of bleeding disorders, allergies to medications used or other medical contraindications to the procedure.  No promises were given to any expected outcome.        PROCEDURE: A time out was performed by physician and assistant.  Correct patient, procedure, plan of care, site, position, and equipment were verified.  The patient was sterilely prepped with a triple scrub of betadine solution and draped in a lateral-oblique position.  Careful attention was paid to aseptic technique throughout the procedure.  An oblique fluoroscopic view was used to identify the appropriate  transforaminal level.  The overlying skin and subcutaneous tissues were anesthetized with approximately 2 cc of 1% preservative-free Xylocaine .  A 25 gauge spinal needle was inserted down to the posterior aspect of the neuroforamen.  An anteroposterior view demonstrated maximal needle tip advancement within the lateral half of the articulating zygapophysial column. Approximately 2 cc of Omnipaque -240 mgI/mL contrast was infiltrated under real-time fluoroscopy demonstrating satisfactory spread along the exiting spinal nerve and into the epidural space without vascular uptake.  No aspirate was noted.  Spot films were taken.  Then, 1 cc of 1% preservative-free Xylocaine  was slowly infiltrated as a test dose under real-time fluoroscopy and contrast was noted to flow freely in the epidural space.  Patient was monitored for approximately 90 seconds and denied any paresthesias, dysesthesias, or unusual extremity weakness that would suggest vascular or intrathecal spread.  Then, 1.0 cc of dexamethasone  sodium phosphate  at 10 mg/cc was slowly infiltrated.  There was good contrast washout.  The needle was removed.  There were no complications.   DISCHARGE SUMMARY: The patient was monitored post-injection for 30 minutes in the recovery area and remained stable without evidence of complications.  After the procedure, the patient ambulated in and from the office without difficulty.  The patient was discharged with discharge instructions in stable condition.  The patient was instructed to contact us  with any problems.  Procedure fluoro time: 35 seconds. Fluoroscopy dose: 9.56 mGy.  This is below the dose threshold #1 (5000 mGy).    BENJAMIN CHARLES CHASNIS, DO

## 2023-03-25 ENCOUNTER — Ambulatory Visit: Payer: Medicare Other | Admitting: Oncology

## 2023-03-25 ENCOUNTER — Other Ambulatory Visit: Payer: Medicare Other

## 2023-04-10 ENCOUNTER — Other Ambulatory Visit: Payer: Self-pay

## 2023-04-10 DIAGNOSIS — D472 Monoclonal gammopathy: Secondary | ICD-10-CM

## 2023-04-11 ENCOUNTER — Inpatient Hospital Stay (HOSPITAL_BASED_OUTPATIENT_CLINIC_OR_DEPARTMENT_OTHER): Payer: Medicare Other | Admitting: Oncology

## 2023-04-11 ENCOUNTER — Inpatient Hospital Stay: Payer: Medicare Other | Attending: Oncology

## 2023-04-11 ENCOUNTER — Encounter: Payer: Self-pay | Admitting: Oncology

## 2023-04-11 VITALS — BP 120/82 | HR 78 | Temp 97.1°F | Resp 18 | Wt 252.4 lb

## 2023-04-11 DIAGNOSIS — Z803 Family history of malignant neoplasm of breast: Secondary | ICD-10-CM | POA: Insufficient documentation

## 2023-04-11 DIAGNOSIS — Z87891 Personal history of nicotine dependence: Secondary | ICD-10-CM | POA: Diagnosis not present

## 2023-04-11 DIAGNOSIS — D472 Monoclonal gammopathy: Secondary | ICD-10-CM | POA: Diagnosis present

## 2023-04-11 LAB — CBC WITH DIFFERENTIAL (CANCER CENTER ONLY)
Abs Immature Granulocytes: 0.05 10*3/uL (ref 0.00–0.07)
Basophils Absolute: 0 10*3/uL (ref 0.0–0.1)
Basophils Relative: 1 %
Eosinophils Absolute: 0.5 10*3/uL (ref 0.0–0.5)
Eosinophils Relative: 8 %
HCT: 41.1 % (ref 36.0–46.0)
Hemoglobin: 13.7 g/dL (ref 12.0–15.0)
Immature Granulocytes: 1 %
Lymphocytes Relative: 29 %
Lymphs Abs: 1.8 10*3/uL (ref 0.7–4.0)
MCH: 29.8 pg (ref 26.0–34.0)
MCHC: 33.3 g/dL (ref 30.0–36.0)
MCV: 89.3 fL (ref 80.0–100.0)
Monocytes Absolute: 0.5 10*3/uL (ref 0.1–1.0)
Monocytes Relative: 7 %
Neutro Abs: 3.4 10*3/uL (ref 1.7–7.7)
Neutrophils Relative %: 54 %
Platelet Count: 240 10*3/uL (ref 150–400)
RBC: 4.6 MIL/uL (ref 3.87–5.11)
RDW: 14 % (ref 11.5–15.5)
WBC Count: 6.3 10*3/uL (ref 4.0–10.5)
nRBC: 0 % (ref 0.0–0.2)

## 2023-04-11 LAB — CMP (CANCER CENTER ONLY)
ALT: 41 U/L (ref 0–44)
AST: 38 U/L (ref 15–41)
Albumin: 4.3 g/dL (ref 3.5–5.0)
Alkaline Phosphatase: 68 U/L (ref 38–126)
Anion gap: 8 (ref 5–15)
BUN: 12 mg/dL (ref 8–23)
CO2: 25 mmol/L (ref 22–32)
Calcium: 9.3 mg/dL (ref 8.9–10.3)
Chloride: 105 mmol/L (ref 98–111)
Creatinine: 0.57 mg/dL (ref 0.44–1.00)
GFR, Estimated: >60 mL/min (ref >60)
Glucose, Bld: 138 mg/dL — ABNORMAL HIGH (ref 70–99)
Potassium: 3.6 mmol/L (ref 3.5–5.1)
Sodium: 138 mmol/L (ref 135–145)
Total Bilirubin: 0.6 mg/dL (ref 0.0–1.2)
Total Protein: 7.2 g/dL (ref 6.5–8.1)

## 2023-04-12 NOTE — Progress Notes (Signed)
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  As a part of the work-up she had SPEP done which showed IgG lambda monoclonal protein of 0.3 g.  Patient has had mildly elevated calcium in the past but most recent calcium was 9.3.  H&H normal at 14.6/44.3.  Renal functions and total protein normal.   Results of blood work from 02/05/2021 were as follows: CBC showed normal white count hemoglobin and platelets with an MCV of 100.7.  CMP was normal.  Myeloma panel showed no M protein on SPEP but immunofixation showed IgG lambda monoclonal protein.  Serum free light chain ratio normal at 1.25  Interval history-she is doing well overall.  Denies any changes in her appetite and weight.  Denies any recent hospitalizations.  No new aches and pains anywhere  ECOG PS- 1 Pain scale- 0   Review of systems- Review of Systems  Constitutional:  Negative for chills, fever, malaise/fatigue and weight loss.  HENT:  Negative for congestion, ear discharge and nosebleeds.   Eyes:  Negative for blurred vision.  Respiratory:  Negative for cough, hemoptysis, sputum production, shortness of breath and wheezing.   Cardiovascular:  Negative for chest pain, palpitations, orthopnea and claudication.  Gastrointestinal:  Negative for abdominal pain, blood in stool, constipation, diarrhea, heartburn, melena, nausea and vomiting.  Genitourinary:   Negative for dysuria, flank pain, frequency, hematuria and urgency.  Musculoskeletal:  Negative for back pain, joint pain and myalgias.  Skin:  Negative for rash.  Neurological:  Negative for dizziness, tingling, focal weakness, seizures, weakness and headaches.  Endo/Heme/Allergies:  Does not bruise/bleed easily.  Psychiatric/Behavioral:  Negative for depression and suicidal ideas. The patient does not have insomnia.       Allergies  Allergen Reactions   Aspirin Hives and Swelling   Penicillin G Hives   Penicillins Hives     Past Medical History:  Diagnosis Date   Anemia    Anxiety    a.) on BZO (clonazepam) PRN   Aortic atherosclerosis (HCC)    Arthritis    B12 deficiency    Bilateral carotid artery disease (HCC)    Chronic back pain    Coronary artery calcification seen on CT scan    Depression    Diastolic dysfunction 09/01/2019   a.) TTE 09/01/2019: >55%; mild LA enlargement; triv PR, mild MR/TR; G1DD.   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   OSA (obstructive sleep apnea)    a.) does not require nocturnal PAP therapy   Pneumonia    Psoriatic arthritis (HCC)    a.) on Ixekizumab (Taltz)   Sensory polyneuropathy    T2DM (  type 2 diabetes mellitus) (HCC)      Past Surgical History:  Procedure Laterality Date   ABDOMINAL HYSTERECTOMY     BACK SURGERY     2 level fusion   BREAST BIOPSY Left 08/23/2013   stereo, negative   BUNIONECTOMY Bilateral    carpal tunnel Right    COLONOSCOPY WITH ESOPHAGOGASTRODUODENOSCOPY (EGD)     DILATION AND CURETTAGE OF UTERUS     KNEE ARTHROSCOPY Left 05/16/2021   Procedure: ARTHROSCOPY KNEE;  Surgeon: Donato Heinz, MD;   Location: [facility] ORS;  Service: Orthopedics;  Laterality: Left;   SHOULDER ARTHROSCOPY WITH SUBACROMIAL DECOMPRESSION, ROTATOR CUFF REPAIR AND BICEP TENDON REPAIR Left 02/14/2022   Procedure: SHOULDER ARTHROSCOPY WITH DEBRIDEMENT, DECOMPRESSION, ROTATOR CUFF REPAIR AND BICEPS TENODESIS.;  Surgeon: Christena Flake, MD;  Location: [facility] ORS;  Service: Orthopedics;  Laterality: Left;   tendinitis Right    wrist   TRIGGER FINGER RELEASE Right    x 2 thumb and middle finger    Social History   Socioeconomic History   Marital status: Married    Spouse name: Not on file   Number of children: Not on file   Years of education: Not on file   Highest education level: Not on file  Occupational History   Not on file  Tobacco Use   Smoking status: Former    Current packs/day: 0.00    Types: Cigarettes    Quit date: 03/11/2016    Years since quitting: 7.0   Smokeless tobacco: Never  Vaping Use   Vaping status: Former  Substance and Sexual Activity   Alcohol use: Not Currently    Comment: occasional   Drug use: No   Sexual activity: Not Currently  Other Topics Concern   Not on file  Social History Narrative   Lives at home with husband   Social Drivers of Health   Financial Resource Strain: Medium Risk (02/26/2023)   Received from [facility]   Overall Financial Resource Strain (CARDIA)    Difficulty of Paying Living Expenses: Somewhat hard  Food Insecurity: No Food Insecurity (02/26/2023)   [facility]   Hunger Vital Sign    Worried About Running Out of Food in the Last Year: Never true    Ran Out of Food in the Last Year: Never true  Transportation Needs: No Transportation Needs (02/26/2023)   Received [facility]    In the past 12 months, has lack of transportation kept you from medical appointments or from getting medications?: No    Lack of Transportation (Non-Medical): No  Physical  Activity: Not on file  Stress: Not on file  Social Connections: Not on file  Intimate Partner Violence: Not on file    Family History  Problem Relation Age of Onset   Breast cancer Mother 90     Current Outpatient Medications:    busPIRone (BUSPAR) 10 MG tablet, Take 1 tablet by mouth 2 (two) times daily., Disp: , Rfl:    traMADol (ULTRAM) 50 MG tablet, Take by mouth., Disp: , Rfl:    amitriptyline (ELAVIL) 10 MG tablet, Take 10 mg by mouth at bedtime as needed for sleep. (Patient not taking: Reported on 03/18/2022), Disp: , Rfl:    bisoprolol-hydrochlorothiazide (ZIAC) 10-6.25 MG tablet, Take 1 tablet by mouth daily., Disp: , Rfl:    clonazePAM (KLONOPIN) 0.5 MG tablet, Take 0.5 mg by mouth 2 (two) times daily as needed for  anxiety., Disp: , Rfl:    clopidogrel (PLAVIX) 75 MG tablet, Take 75 mg by mouth daily., Disp: , Rfl:    Ergocalciferol (VITAMIN D2 PO), Take 50,000 Units by mouth once a week., Disp: , Rfl:    escitalopram (LEXAPRO) 20 MG tablet, Take 20 mg by mouth daily., Disp: , Rfl:    glimepiride (AMARYL) 4 MG tablet, Take 4 mg by mouth daily., Disp: , Rfl:    levothyroxine (SYNTHROID) 50 MCG tablet, Take 75 mcg by mouth daily before breakfast., Disp: , Rfl:    oxyCODONE (ROXICODONE) 5 MG immediate release tablet, Take 1-2 tablets (5-10 mg total) by mouth every 4 (four) hours as needed for moderate pain or severe pain. (Patient not taking: Reported on 04/11/2023), Disp: 40 tablet, Rfl: 0   pregabalin (LYRICA) 50 MG capsule, Take 50 mg by mouth 2 (two) times daily., Disp: , Rfl:    rosuvastatin (CRESTOR) 40 MG tablet, Take 40 mg by mouth daily., Disp: , Rfl:    TALTZ 80 MG/ML SOAJ, Inject 80 mg into the skin every 14 (fourteen) days., Disp: , Rfl:    vitamin B-12 (CYANOCOBALAMIN) 1000 MCG tablet, Take 1,000 mcg by mouth every 30 (thirty) days. , Disp: , Rfl:   Physical exam:  Vitals:   04/11/23 1424  BP: 120/82  Pulse: 78  Resp: 18  Temp: (!) 97.1 F (36.2 C)  TempSrc:  Tympanic  SpO2: 96%  Weight: 252 lb 6.4 oz (114.5 kg)   Physical Exam Cardiovascular:     Rate and Rhythm: Normal rate and regular rhythm.     Heart sounds: Normal heart sounds.  Pulmonary:     Effort: Pulmonary effort is normal.     Breath sounds: Normal breath sounds.  Skin:    General: Skin is warm and dry.  Neurological:     Mental Status: She is alert and oriented to person, place, and time.         Latest Ref Rng & Units 04/11/2023    1:45 PM  CMP  Glucose 70 - 99 mg/dL 657   BUN 8 - 23 mg/dL 12   Creatinine 8.46 - 1.00 mg/dL 9.62   Sodium 952 - 841 mmol/L 138   Potassium 3.5 - 5.1 mmol/L 3.6   Chloride 98 - 111 mmol/L 105   CO2 22 - 32 mmol/L 25   Calcium 8.9 - 10.3 mg/dL 9.3   Total Protein 6.5 - 8.1 g/dL 7.2   Total Bilirubin 0.0 - 1.2 mg/dL 0.6   Alkaline Phos 38 - 126 U/L 68   AST 15 - 41 U/L 38   ALT 0 - 44 U/L 41       Latest Ref Rng & Units 04/11/2023    1:45 PM  CBC  WBC 4.0 - 10.5 K/uL 6.3   Hemoglobin 12.0 - 15.0 g/dL 32.4   Hematocrit 40.1 - 46.0 % 41.1   Platelets 150 - 400 K/uL 240      Assessment and plan- Patient is a 62 y.o. 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  No evidence of crab criteria.  CBC and CMP are within normal limits. Myeloma panel and serum free light chain ratio is currently pending.  Her prior values from a year ago did not show any evidence of M protein on SPEP but detected a small amount of IgG monoclonal lambda paraprotein on immunofixation.  Serum free light chain ratio  back then was also normal.  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  I will see her back in 1 year with labs   Visit Diagnosis 1. MGUS (monoclonal gammopathy of unknown significance)      Dr. Owens Shark, MD, MPH [facility] [phone] 04/12/2023 6:00 PM

## 2023-04-13 LAB — KAPPA/LAMBDA LIGHT CHAINS
Kappa free light chain: 13.1 mg/L (ref 3.3–19.4)
Kappa, lambda light chain ratio: 0.92 (ref 0.26–1.65)
Lambda free light chains: 14.2 mg/L (ref 5.7–26.3)

## 2023-04-15 LAB — MULTIPLE MYELOMA PANEL, SERUM
Albumin SerPl Elph-Mcnc: 4 g/dL (ref 2.9–4.4)
Albumin/Glob SerPl: 1.5 (ref 0.7–1.7)
Alpha 1: 0.2 g/dL (ref 0.0–0.4)
Alpha2 Glob SerPl Elph-Mcnc: 0.7 g/dL (ref 0.4–1.0)
B-Globulin SerPl Elph-Mcnc: 1.1 g/dL (ref 0.7–1.3)
Gamma Glob SerPl Elph-Mcnc: 0.7 g/dL (ref 0.4–1.8)
Globulin, Total: 2.7 g/dL (ref 2.2–3.9)
IgA: 122 mg/dL (ref 87–352)
IgG (Immunoglobin G), Serum: 865 mg/dL (ref 586–1602)
IgM (Immunoglobulin M), Srm: 67 mg/dL (ref 26–217)
Total Protein ELP: 6.7 g/dL (ref 6.0–8.5)

## 2023-07-30 DIAGNOSIS — J449 Chronic obstructive pulmonary disease, unspecified: Secondary | ICD-10-CM | POA: Insufficient documentation

## 2023-08-06 NOTE — Progress Notes (Signed)
 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  She quit smoking eight years ago but is occasionally exposed to secondhand smoke from her son, who smokes infrequently. She uses Advair and Albuterol  inhalers and requires refills for both. A recent pulmonary function test on Jul 30, 2023, showed an FEV1 of 1.85 liters (76% of predicted), a total lung capacity of 5.16 liters (99% of predicted), and a DLCO of 100%.  She experiences occasional dry coughing but no major issues. She has generalized pain from head to toe, attributed to her psoriatic arthritis. She is on injections for this condition and has recently started a new medication, although she has not yet received a dose.  She has severe sleep apnea and has difficulty using a CPAP machine due to discomfort, particularly with the mask drying out her mouth. A previous home sleep apnea test confirmed severe sleep apnea. She has returned her CPAP machine and would need a new one if required.  She mentions upcoming surgeries needed for both knees, her neck, and her shoulder. She recalls a previous shoulder surgery where she was advised to use CPAP post-operatively due to her sleep apnea.   Past Medical History:   Past Medical History:  Diagnosis Date  . Anemia   . Back pain   . Chickenpox   . Diabetes mellitus type 2, uncomplicated (CMS/HHS-HCC)    Gestational  . Esophageal spasm   . Hx of migraine headaches   . Hyperlipidemia   . Hypertension   . Psoriasis   . Thyroid  nodule    thyroid  ultrasound 03/2014    Past Surgical History:   Past Surgical History:  Procedure Laterality Date  . COLONOSCOPY  09/17/2013   Indic: Screening. 1 Tubular Adenoma.  . COLONOSCOPY  08/13/2018    Indic: Diarrhea. Descending diverticulitis. Random bx - normal colonic mucosa. PHx polyps - [facility] 08/2023  . EGD  08/13/2018   Indic: Dysphagia, GERD. Normal esophagus. Normal stomach (bx - reactive foveolar hyperplasia, fibrosis, and mixed inflammation c/w tissue adjacent to erosion; minimal chronic gastritis). Duodenitis in 2nd portion of duodenum (path - normal duodenal mucosa).  . Left knee arthroscopy, partial medial meniscectomy, and chondroplasty  05/16/2021   Dr Mardee  . back surgery    . BUNION CORRECTION    . CESAREAN SECTION    . ENDOSCOPIC CARPAL TUNNEL RELEASE Right   . HYSTERECTOMY    . RIGHT LONG TRIGGER FINGER RELEASE    . RIGHT TRIGGER THUMB RELEASE    . Tendon surgery    . TRIGGER FINGER RELEASE      Allergies:   Allergies  Allergen Reactions  . Aspirin Hives  . Penicillin G Hives    Current Medications:   Prior to Admission medications   Medication Sig Taking? Last Dose  ADVAIR HFA 115-21 mcg/actuation inhaler Inhale 2 inhalations into the lungs every 12 (twelve) hours Yes Taking  bimekizumab-bkzx (BIMZELX AUTOINJECTOR) 320 mg/2 mL AtIn Inject 320 mg  subcutaneously every 8 (eight) weeks MAINTENANCE DOSE  Inject 320MG  ( two 160MG  injections) SQ every 8 weeks Yes Taking  bimekizumab-bkzx 320 mg/2 mL Syrg Inject 320 mg subcutaneously every 8 (eight) weeks MAINTENANCE DOSE: INJECT 320MG  ( TWO 160MG  INJECTIONS) SUBQ EVERY 8 WEEKS Yes Taking  bisoproloL-hydroCHLOROthiazide (ZIAC) 10-6.25 mg tablet Take 1 tablet by mouth once daily Yes Taking  buPROPion (WELLBUTRIN XL) 150 MG XL tablet Take 1 tablet (150 mg total) by mouth once daily Yes Taking  busPIRone (BUSPAR) 10 MG tablet Take 1 tablet (10 mg total) by mouth 2 (two) times daily Yes Taking  clonazePAM (KLONOPIN) 0.5 MG tablet Take 1 tablet (0.5 mg total) by mouth 3 (three) times daily as needed for Anxiety Yes Taking  clopidogreL (PLAVIX) 75 mg tablet Take 1 tablet (75 mg total) by mouth once daily Yes Taking   diazePAM  (VALIUM ) 5 MG tablet 1-2 30 minutes before ESI Yes Taking  escitalopram oxalate (LEXAPRO) 20 MG tablet TAKE 1 TABLET(20 MG) BY MOUTH DAILY Yes Taking  glimepiride (AMARYL) 4 MG tablet Take 1 tablet (4 mg total) by mouth 2 (two) times daily Yes Taking  levothyroxine (SYNTHROID) 50 MCG tablet TAKE 1 AND 1/2 TABLETS(75 MCG) BY MOUTH DAILY 30 TO 60 MINUTES BEFORE BREAKFAST ON AN EMPTY STOMACH AND WITH A GLASS OF WATER Yes Taking  naproxen sodium 220 mg Cap Take 440 mg by mouth every 6 (six) hours as needed Yes Taking  nortriptyline (PAMELOR) 25 MG capsule Take 1 capsule (25 mg total) by mouth at bedtime Yes Taking  nystatin (MYCOSTATIN) 100,000 unit/mL suspension Swish and swallow 5 mLs 4 (four) times daily for 10 days Yes Taking  omeprazole (PRILOSEC) 20 MG DR capsule Take 20 mg by mouth once daily as needed Take 30 minutes before breakfast. Yes Taking  pregabalin (LYRICA) 100 MG capsule Take 1 capsule (100 mg total) by mouth 2 (two) times daily Yes Taking  rosuvastatin (CRESTOR) 40 MG tablet Take 1 tablet (40 mg total) by mouth once daily Yes Taking  TORsemide (DEMADEX) 20 MG tablet TAKE 1 TABLET(20 MG) BY MOUTH DAILY AS NEEDED FOR UP TO 90 DAYS Yes Taking  traMADoL (ULTRAM) 50 mg tablet Take 1 tablet (50 mg total) by mouth 2 (two) times daily as needed for Pain Yes Taking  albuterol  MDI, PROVENTIL , VENTOLIN , PROAIR , HFA 90 mcg/actuation inhaler Inhale 2 inhalations into the lungs every 6 (six) hours as needed for up to 90 days VENTOLIN  ONLY PLEASE    ergocalciferol, vitamin D2, 1,250 mcg (50,000 unit) capsule Take 1 capsule (50,000 Units total) by mouth once a week for 30 days      Family History:   Family History  Problem Relation Name Age of Onset  . Breast cancer Mother    . Bone cancer Mother    . Hyperlipidemia (Elevated cholesterol) Mother    . High blood pressure (Hypertension) Mother    . Diabetes type II Mother    . Deep vein thrombosis (DVT or abnormal blood clot  formation) Father    . Myocardial Infarction (Heart attack) Father      Social History:   Social History   Socioeconomic History  . Marital status: Married    Spouse name: Marcey  . Number of children: 3  . Years of education: 30  . Highest education level: High school graduate  Occupational History  . Occupation: Disabled  Tobacco Use  . Smoking status: Former    Current packs/day: 0.00    Types: Cigarettes  Quit date: 05/05/2016    Years since quitting: 7.2    Passive exposure: Past  . Smokeless tobacco: Never  . Tobacco comments:    Smokes only when she drinks on occasions.  Vaping Use  . Vaping status: Never Used  Substance and Sexual Activity  . Alcohol use: Yes    Alcohol/week: 2.0 standard drinks of alcohol    Types: 2 Standard drinks or equivalent per week    Comment: 1-2 mixed drinks  . Drug use: Never  . Sexual activity: Defer    Partners: Male   Social Drivers of Health   Financial Resource Strain: Low Risk  (08/06/2023)   Overall Financial Resource Strain (CARDIA)   . Difficulty of Paying Living Expenses: Not very hard  Food Insecurity: Food Insecurity Present (08/06/2023)   Hunger Vital Sign   . Worried About Programme researcher, broadcasting/film/video in the Last Year: Never true   . Ran Out of Food in the Last Year: Sometimes true  Transportation Needs: No Transportation Needs (08/06/2023)   PRAPARE - Transportation   . Lack of Transportation (Medical): No   . Lack of Transportation (Non-Medical): No  Housing Stability: High Risk (08/06/2023)   Housing Stability Vital Sign   . Unable to Pay for Housing in the Last Year: Yes   . Number of Times Moved in the Last Year: 0   . Homeless in the Last Year: No    Review of Systems:   A 10 point review of systems is negative, except for the pertinent positives and negatives detailed in the HPI.  Vitals:   Vitals:   08/06/23 0846  BP: 106/75  Pulse: 82  SpO2: 95%  Weight: (!) 111.1 kg (245 lb)  Height: 165.1 cm (5' 5)      Body mass index is 40.77 kg/m.  Physical Exam:  Physical Exam Vitals and nursing note reviewed.  Constitutional:      General: She is not in acute distress.    Appearance: Normal appearance. She is not ill-appearing, toxic-appearing or diaphoretic.  HENT:     Head: Normocephalic and atraumatic.     Right Ear: External ear normal.     Left Ear: External ear normal.  Eyes:     General:        Right eye: No discharge.        Left eye: No discharge.     Extraocular Movements: Extraocular movements intact.     Pupils: Pupils are equal, round, and reactive to light.  Cardiovascular:     Rate and Rhythm: Normal rate and regular rhythm.     Pulses: Normal pulses.     Heart sounds: Normal heart sounds. No murmur heard.    No friction rub. No gallop.  Abdominal:     General: Bowel sounds are normal.  Skin:    General: Skin is warm and dry.     Capillary Refill: Capillary refill takes less than 2 seconds.  Neurological:     Mental Status: She is alert.     Lab and Imaging Results:  Results     Assessment and Plan:   Diagnoses and all orders for this visit:  Chronic obstructive pulmonary disease, unspecified COPD type (CMS/HHS-HCC)  Depression screening (Z13.31)  Grieving  Former smoker    Assessment & Plan COPD, stage 1 COPD is at stage 1, showing improvement. Spirometry reveals FEV1 of 1.85 liters (76%), total lung capacity at 99% (5.16 liters), and DLCO at 100%, indicating good control likely due to  effective inhaler use. - Ensure refills for Advair and Albuterol  with a 90-day supply.  Severe sleep apnea Severe sleep apnea with intolerance to CPAP due to discomfort and dryness. Discussed alternative treatment with a nasal cushion that provides pressurized air without covering the mouth or nose, potentially improving energy levels and reducing fatigue. Emphasized CPAP use, especially if surgery is planned, to reduce cardiac stress and ensure adequate  oxygenation. - Consider trial of nasal cushion for sleep apnea management. - Perform a home sleep apnea test to reassess severity and qualify for CPAP.  Psoriatic arthritis Psoriatic arthritis causing widespread pain. Recently initiated on a new injection treatment, pending first dose.   I spent a total of 46 minutes in both face-to-face and non-face-to-face activities, excluding procedures performed, for this visit on the date of this encounter.     This note has been created using dictation software tool and any typographical errors are purely unintentional.  Patient received an After Visit Summary

## 2023-08-15 NOTE — Progress Notes (Signed)
 [facility] PHYSICAL THERAPY EVALUATION    DATE:08/14/2023 Patient's Name:Taylor [name]    DOB:[birth year] Referring Physician:Meeler, Benton, NP    Medical Diagnosis: neck pain, cervical spinal stenosis, HNP cervical spine, cervical radiculitis,      Treatment Diagnosis: 1. Pain, neck   2. Chronic bilateral low back pain with bilateral sciatica    Is patient aware of diagnosis/prognosis: [x]  YES      []  NO Subjective:   Pt states she has had neck pain for 14 years.  She has had shots before, but they did not last very long (2 weeks).  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 She reports she has pain all the time and all over but cont to do with activities that is needed to be done.  Pt states her shoulder (Left) has been doing better but she still has some good and some bad days overall.  She is not certain that PT will help her condition since she has had chronic pain for so long; she is uncertain if she would consider surgery.   Past Medical history: Past Medical History:  Diagnosis Date  . Anemia   . Back pain   . Chickenpox   . Diabetes mellitus type 2, uncomplicated (CMS/HHS-HCC)    Gestational  . Esophageal spasm   . Hx of migraine headaches   . Hyperlipidemia   . Hypertension   . Psoriasis   . Thyroid  nodule    thyroid  ultrasound 03/2014   Past Surgical History:  Procedure Laterality Date  . COLONOSCOPY  09/17/2013   Indic: Screening. 1 Tubular Adenoma.  . COLONOSCOPY  08/13/2018   Indic: Diarrhea. Descending diverticulitis. Random bx - normal colonic mucosa. PHx polyps - CBF 08/2023  . EGD  08/13/2018   Indic: Dysphagia, GERD. Normal esophagus. Normal stomach (bx - reactive foveolar hyperplasia, fibrosis, and mixed inflammation c/w tissue adjacent to erosion; minimal chronic gastritis). Duodenitis in 2nd portion of duodenum (path - normal duodenal  mucosa).  . Left knee arthroscopy, partial medial meniscectomy, and chondroplasty  05/16/2021   Dr Mardee  . back surgery    . BUNION CORRECTION    . CESAREAN SECTION    . ENDOSCOPIC CARPAL TUNNEL RELEASE Right   . HYSTERECTOMY    . RIGHT LONG TRIGGER FINGER RELEASE    . RIGHT TRIGGER THUMB RELEASE    . Tendon surgery    . 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   Current Functional level: Pt limited in mobility due to pain and chronic conditions.  She requires extra time for completion of tasks  Pain scale: Current resting pain = 7/10.  Pain at its worst with activity = 10/10.  Aggravating:  activity, movement, position,   Alleviating:  nothing really  OBJECTIVE Postural/Observation: Endomorphic, forward head, L shoulder depressed; dec lumbar lordosis.  Pt with grimacing during bed mobility and repositioning  AROM: Flexion   WFL  Extension  50% limitation, painful  Right Sidebending  75% limitation, L side pain  Left Sidebending 75% limitation,  with inc L side pain  Right Rotation  30  Left Rotation  20 painful     Strength:     Right  Left Cervical SB   4/5  4/5 Shrugs    4/5  4/5 Shoulder Abd   4/5  4/5 Biceps    4/5  4/5 Triceps    4/5  4/5 Thumb Ext      Intrinsics      Deep neck flexors    Scapular stabilizers  4/5  4/5   Reflex screen: NT  Sensory: Light touch appears grossly intact, pt reports tingling and numbness B hands; L hand the pinky is dead feeling    Gait: Dec gait speed  Palpation: TTP throughout cervical spine, upper back, suboccipitals.   Manual therapy cervical  paraspinals caused referral pain pattern and headache forehead.  ASSESSMENT: The patient is a 63 year old female referred to PT due to chronic neck pain.  Overall she appears stable and uncomplicated.  A low complexity evaluation was performed this date. She presents with s/s consistent with cervical stenosis and radicular symptoms.   Pain was elicited during all movement and activity this day; unable to provide relief of symptoms with change of position/manual therapy/stretching/rest.  PT instructed pt in daily activity and gentle ROM/stretching to perform at home.  This pt is a poor candidate for PT as pain is chronic and worsening with limited symptoms relief at this time.    PLAN: Patient's Goals:  To not have pain  Pt planning for follow up with MD as she does not feel PT will be beneficial at this time.  No further skilled needs at this time as pt is a poor candidate for PT.   Long Term Goals: 1.  Decrease pain at its worst with activity to 5/10 or less. 2.  The patient will be independent in a thorough HEP to improve ROM, strength, posture and function in an effort to return to mod ind prior level of function.  Treatment Plan: The patient will receive modalities as needed including moist heat, ultrasound, infrared, electrical stimulation, mechanical traction and cold packs.  Therapeutic exercise and therapeutic activities will be performed to increase ROM, strength, flexibility, posture, ergonomics, balance, proprioception and function.  Manual therapy will be performed to increase ROM, strength, mobility and flexibility as well as decrease tension, spasms and tissue texture.  Neuromuscular reeducation and activities of daily living training will be performed to increase strength, stabilization, posture, ergonomics, balance, proprioception and function.  The patient may receive gait training to normailize the gait cycle.  In addition, the patient will participate in a progressive home exercise  program to complement the formal physical therapy sessions.  Frequency: 1 per week for 1 weeks. Precautions:  universal   Neck Treatment Log     Date  6/5           UBE            Shoulder Retraction            Shoulder Extension            Shoulder Shrugs            Standing Bilateral ER            Standing Horiz. Abd            Lat Pull Downs            F.  M. Rows            Cervical Retraction            Doorway (Pect) Stretch            Stretch- Upper Trap/ [place] Scap            HEP:  cervical rotation, isometrics, levator scap stretch performed                                                                                                     PROM, manual, STM,  15 min            Cervical Traction- mech/manual            Ultrasound/ Infrared            HP/CP  with/without IFC              PT Billing Documentation PT Evaluation or progress note completed today: Yes Date of Onset: 03/12/07 Visit Number: 1  Evaluation and Re-Evaluation Codes PT Eval LOW Complexity CPT 97161: 1    Frequently Used Timed Codes Manual Therapy CPT 97140: 15 minutes                 Total Treatment Time: 60 minutes  This note was generated in part with voice recognition software and I apologize for any typographical errors that were not detected and corrected. If the patient does not return for follow up visit(s) related to this episode of care, this note will serve as their discharge note from physical therapy.    Therapist:ERIN WEBB, PT                        Date:08/15/2023

## 2023-08-24 DIAGNOSIS — M1712 Unilateral primary osteoarthritis, left knee: Principal | ICD-10-CM | POA: Insufficient documentation

## 2023-08-24 NOTE — Progress Notes (Signed)
 Chief Complaint: Chief Complaint  Patient presents with  . Left Knee - Pain  . Right Knee - Pain    Reason for Visit: The patient is a 63 y.o. female who presents today for reevaluation of both knees. She reports a long history of bilateral knee pain with the left knee more symptomatic.  She localizes most of the pain along the medial aspect of the knees. She reports some swelling, no locking, and some giving way of the knees. The pain is aggravated by any weight bearing. The knee pain limits the patient's ability to ambulate long distances. The patient has not appreciated any significant improvement despite Tylenol , NSAIDs, intraarticular corticosteroid injections, viscosupplementation, and activity modification. She is not using any ambulatory aids. The patient states that the knee pain has progressed to the point that it is significantly interfering with her activities of daily living.  Medications: Current Outpatient Medications  Medication Sig Dispense Refill  . ADVAIR HFA 115-21 mcg/actuation inhaler Inhale 2 inhalations into the lungs every 12 (twelve) hours 36 g 3  . albuterol  MDI, PROVENTIL , VENTOLIN , PROAIR , HFA 90 mcg/actuation inhaler Inhale 2 inhalations into the lungs every 6 (six) hours as needed for up to 90 days VENTOLIN  ONLY PLEASE 54 g 3  . bimekizumab-bkzx (BIMZELX AUTOINJECTOR) 320 mg/2 mL AtIn Inject 320 mg subcutaneously every 8 (eight) weeks MAINTENANCE DOSE  Inject 320MG  ( two 160MG  injections) SQ every 8 weeks 6 mL 1  . bimekizumab-bkzx 320 mg/2 mL Syrg Inject 320 mg subcutaneously every 8 (eight) weeks MAINTENANCE DOSE: INJECT 320MG  ( TWO 160MG  INJECTIONS) SUBQ EVERY 8 WEEKS 3 mL 1  . bisoproloL-hydroCHLOROthiazide (ZIAC) 10-6.25 mg tablet Take 1 tablet by mouth once daily 90 tablet 1  . buPROPion (WELLBUTRIN XL) 150 MG XL tablet Take 1 tablet (150 mg total) by mouth once daily 30 tablet 5  . busPIRone (BUSPAR) 10 MG tablet Take 1 tablet (10 mg total) by mouth 2 (two)  times daily 60 tablet 11  . clonazePAM (KLONOPIN) 0.5 MG tablet Take 1 tablet (0.5 mg total) by mouth 3 (three) times daily as needed for Anxiety 60 tablet 2  . clopidogreL (PLAVIX) 75 mg tablet Take 1 tablet (75 mg total) by mouth once daily 30 tablet 0  . diazePAM  (VALIUM ) 5 MG tablet 1-2 30 minutes before ESI 2 tablet 0  . escitalopram oxalate (LEXAPRO) 20 MG tablet TAKE 1 TABLET(20 MG) BY MOUTH DAILY 90 tablet 3  . glimepiride (AMARYL) 4 MG tablet Take 1 tablet (4 mg total) by mouth 2 (two) times daily 180 tablet 1  . levothyroxine (SYNTHROID) 50 MCG tablet TAKE 1 AND 1/2 TABLETS(75 MCG) BY MOUTH DAILY 30 TO 60 MINUTES BEFORE BREAKFAST ON AN EMPTY STOMACH AND WITH A GLASS OF WATER 135 tablet 1  . naproxen sodium 220 mg Cap Take 440 mg by mouth every 6 (six) hours as needed    . nortriptyline (PAMELOR) 25 MG capsule Take 1 capsule (25 mg total) by mouth at bedtime 30 capsule 5  . omeprazole (PRILOSEC) 20 MG DR capsule Take 20 mg by mouth once daily as needed Take 30 minutes before breakfast.    . pregabalin (LYRICA) 100 MG capsule Take 1 capsule (100 mg total) by mouth 2 (two) times daily 180 capsule 1  . rosuvastatin (CRESTOR) 40 MG tablet Take 1 tablet (40 mg total) by mouth once daily 90 tablet 1  . TORsemide (DEMADEX) 20 MG tablet TAKE 1 TABLET(20 MG) BY MOUTH DAILY AS NEEDED FOR UP TO  90 DAYS 90 tablet 0  . traMADoL (ULTRAM) 50 mg tablet Take 1 tablet (50 mg total) by mouth 2 (two) times daily as needed for Pain 60 tablet 2  . ergocalciferol, vitamin D2, 1,250 mcg (50,000 unit) capsule Take 1 capsule (50,000 Units total) by mouth once a week for 30 days 12 capsule 3   Current Facility-Administered Medications  Medication Dose Route Frequency Provider Last Rate Last Admin  . cyanocobalamin (VITAMIN B12) injection 1,000 mcg  1,000 mcg Intramuscular Q30 Days Auston Reyes BIRCH, MD   1,000 mcg at 08/22/23 9083    Allergies: Allergies  Allergen Reactions  . Aspirin Hives  . Penicillin G  Hives    Past Medical History: Past Medical History:  Diagnosis Date  . Anemia   . Back pain   . Chickenpox   . Diabetes mellitus type 2, uncomplicated (CMS/HHS-HCC)    Gestational  . Esophageal spasm   . Hx of migraine headaches   . Hyperlipidemia   . Hypertension   . Psoriasis   . Thyroid  nodule    thyroid  ultrasound 03/2014    Past Surgical History: Past Surgical History:  Procedure Laterality Date  . COLONOSCOPY  09/17/2013   Indic: Screening. 1 Tubular Adenoma.  . COLONOSCOPY  08/13/2018   Indic: Diarrhea. Descending diverticulitis. Random bx - normal colonic mucosa. PHx polyps - CBF 08/2023  . EGD  08/13/2018   Indic: Dysphagia, GERD. Normal esophagus. Normal stomach (bx - reactive foveolar hyperplasia, fibrosis, and mixed inflammation c/w tissue adjacent to erosion; minimal chronic gastritis). Duodenitis in 2nd portion of duodenum (path - normal duodenal mucosa).  . Left knee arthroscopy, partial medial meniscectomy, and chondroplasty  05/16/2021   Dr Mardee  . back surgery    . BUNION CORRECTION    . CESAREAN SECTION    . ENDOSCOPIC CARPAL TUNNEL RELEASE Right   . HYSTERECTOMY    . RIGHT LONG TRIGGER FINGER RELEASE    . RIGHT TRIGGER THUMB RELEASE    . Tendon surgery    . TRIGGER FINGER RELEASE      Social History: Social History   Socioeconomic History  . Marital status: Married    Spouse name: Marcey  . Number of children: 3  . Years of education: 86  . Highest education level: High school graduate  Occupational History  . Occupation: Disabled  Tobacco Use  . Smoking status: Former    Current packs/day: 0.00    Types: Cigarettes    Quit date: 05/05/2016    Years since quitting: 7.3    Passive exposure: Past  . Smokeless tobacco: Never  . Tobacco comments:    Smokes only when she drinks on occasions.  Vaping Use  . Vaping status: Never Used  Substance and Sexual Activity  . Alcohol use: Not Currently    Comment: 1-2 mixed drinks  . Drug use:  Never  . Sexual activity: Defer    Partners: Male   Social Drivers of Health   Financial Resource Strain: Low Risk  (08/14/2023)   Overall Financial Resource Strain (CARDIA)   . Difficulty of Paying Living Expenses: Not very hard  Food Insecurity: Food Insecurity Present (08/14/2023)   Hunger Vital Sign   . Worried About Programme researcher, broadcasting/film/video in the Last Year: Sometimes true   . Ran Out of Food in the Last Year: Sometimes true  Transportation Needs: No Transportation Needs (08/14/2023)   PRAPARE - Transportation   . Lack of Transportation (Medical): No   . Lack of  Transportation (Non-Medical): No  Housing Stability: High Risk (08/14/2023)   Housing Stability Vital Sign   . Unable to Pay for Housing in the Last Year: Yes   . Number of Times Moved in the Last Year: 0   . Homeless in the Last Year: No    Family History: Family History  Problem Relation Name Age of Onset  . Breast cancer Mother    . Bone cancer Mother    . Hyperlipidemia (Elevated cholesterol) Mother    . High blood pressure (Hypertension) Mother    . Diabetes type II Mother    . Deep vein thrombosis (DVT or abnormal blood clot formation) Father    . Myocardial Infarction (Heart attack) Father      Review of Systems: A comprehensive 14 point ROS was performed, reviewed, and the pertinent orthopaedic findings are documented in the HPI.  Exam BP 124/80   Ht 165.1 cm (5' 5)   Wt (!) 111.2 kg (245 lb 3.2 oz)   LMP  (LMP Unknown) Comment: Hysterectomy  BMI 40.80 kg/m   General:  Well-developed, well-nourished female seen in no acute distress.  Antalgic gait. Varus thrust to the both knees.  HEENT:  Atraumatic, normocephalic.  Pupils are equal and reactive to light.  Extraocular motion is intact. Sclera are clear.  Oropharynx is clear with moist mucosa.  Neck:  Supple, nontender, and with good ROM. No thyromegaly, adenopathy, JVD, or carotid bruits.  Lungs:  Clear to auscultation  bilaterally.  Cardiovascular:  Regular rate and rhythm.  Normal S1, S2.  No murmur .  No appreciable gallops or rubs. Peripheral pulses are palpable.  No lower extremity edema.  Homan`s test is negative.  Abdomen:  Soft, nontender, nondistended.  Bowel sounds are present.  Extremities: Good strength, stability, and range of motion of the upper extremities. Good range of motion of the hips and ankles.  Right  Knee:    Soft tissue swelling: mild; popliteal cyst is palpable    Effusion:  minimal    Erythema:  none    Crepitance:  mild    Tenderness:  medial    Alignment:  relative varus    Mediolateral laxity: medial pseudolaxity    Posterior sag: negative    Patellar tracking: Good tracking without evidence of subluxation or tilt    Atrophy:  No significant atrophy.      Quadriceps tone was fair to good.    Range of motion: 0/0/121 degrees  Left  Knee:    Soft tissue swelling: mild    Effusion:  minimal    Erythema:  none    Crepitance:  mild    Tenderness:  medial    Alignment:  relative varus    Mediolateral laxity: medial pseudolaxity    Posterior sag: negative    Patellar tracking: Good tracking without evidence of subluxation or tilt    Atrophy:  No significant atrophy.      Quadriceps tone was fair to good.    Range of motion: 0/6/108 degrees  Neurologic:  Awake, alert, and oriented.  Sensory function is intact to pinprick and light touch.   Motor strength is judged to be 5/5.   Motor coordination is within normal limits.   No apparent clonus. No tremor.    X-rays: I ordered and interpreted standing AP, lateral, and sunrise radiographs of the left knee that were obtained in the office today. There is significant narrowing of the medial cartilage space with associated varus alignment.  Osteophyte formation is  noted. Subchondral sclerosis is noted.  No evidence of fracture or dislocation.   I ordered and interpreted standing AP, lateral, and sunrise radiographs of  the right knee that were obtained in the office today. There is significant narrowing of the medial cartilage space with associated varus alignment.  Osteophyte formation is noted. Subchondral sclerosis is noted.  No evidence of fracture or dislocation.   Impression: Degenerative arthrosis of both knees, left more symptomatic than right  Plan:   The findings were discussed in detail with the patient. The patient was given informational material on total knee replacement. Conservative treatment options were reviewed with the patient.  We discussed the risks and benefits of surgical intervention.  The usual perioperative course was also discussed in detail.  The patient expressed understanding of the risks and benefits of surgical intervention and would like to proceed with plans for left total knee arthroplasty in October.  Hemoglobin A1c is an indication of glucose control. Uncontrolled diabetes has been associated with perioperative complications including poor wound healing and surgical site infections. To decrease the risk of perioperative complications, hemoglobin A1c must be less than 8.0 prior to surgery. The patient is encouraged to work with her primary care physician and/or endocrinologist to optimize glucose management.  Lab Results  Component Value Date   HGBA1C 7.5 (H) 07/14/2023   HGBA1C 8.6 (H) 04/08/2023    Body mass index is 40.8 kg/m. Obesity has been associated with hypertension, diabetes, dyslipidemia, obstructive sleep apnea, arthritis, back pain, and depression. Obesity is most commonly caused by sedentary lifestyle and increased calorie intake. The patient should increase physical activity (especially low impact exercise) and reduce dietary intake (e.g. small portion via small plate size, avoid starving yourself, avoid highly processed food).  Body mass index greater than 40 has been associated with increased risk for perioperative complications including poor wound healing,  surgical site infection, deep venous thrombosis, and cardiopulmonary complications. Wt Readings from Last 3 Encounters:  08/22/23 (!) 111.2 kg (245 lb 3.2 oz)  08/14/23 (!) 115.3 kg (254 lb 3.2 oz)  08/06/23 (!) 111.1 kg (245 lb)    I spent a total of 45 minutes in both face-to-face and non-face-to-face activities, excluding procedures performed, for this visit on the date of this encounter.  MEDICAL CLEARANCE: Per anesthesiology and Dr. Dewane (Cardiology). ACTIVITY: As tolerated. WORK STATUS: Not applicable. THERAPY: Preoperative physical therapy evaluation. MEDICATIONS: Requested Prescriptions    No prescriptions requested or ordered in this encounter   FOLLOW-UP: Return for preop History & Physical pending surgery date.    James P. Hooten, Jr., M.D.  This note was generated in part with voice recognition software and I apologize for any typographical errors that were not detected and corrected.

## 2023-09-02 ENCOUNTER — Other Ambulatory Visit: Payer: Self-pay | Admitting: Gastroenterology

## 2023-09-02 DIAGNOSIS — R1312 Dysphagia, oropharyngeal phase: Secondary | ICD-10-CM

## 2023-09-02 DIAGNOSIS — R633 Feeding difficulties, unspecified: Secondary | ICD-10-CM

## 2023-09-10 ENCOUNTER — Ambulatory Visit

## 2023-09-10 NOTE — Progress Notes (Incomplete)
{  SLP ALL NOTES:[id number]} 

## 2023-09-17 ENCOUNTER — Ambulatory Visit
Admission: RE | Admit: 2023-09-17 | Discharge: 2023-09-17 | Disposition: A | Discharge status: Home / Self Care | Source: Ambulatory Visit | Attending: Gastroenterology | Admitting: Gastroenterology

## 2023-09-17 DIAGNOSIS — R1312 Dysphagia, oropharyngeal phase: Secondary | ICD-10-CM | POA: Diagnosis present

## 2023-09-17 DIAGNOSIS — R633 Feeding difficulties, unspecified: Secondary | ICD-10-CM | POA: Diagnosis present

## 2023-09-17 NOTE — Procedures (Signed)
 Modified Barium Swallow Study  Patient Details  Name: Taylor [name] MRN: [id number] Date of Birth: 1960-12-01  Today's Date: 09/17/2023  Modified Barium Swallow completed.  Full report located under Chart Review in the Imaging Section.  History of Present Illness Pt is a 63 year old female who was referred by GI Lenore Gails) d/t report of chronic dysphagia the level of the mid neck ongoing for many years intermittently.Denies any GERD/dyspepsia, melena, and all other GI concerns. 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 Says she did not go for the manometry/pH testing. Says she has not had MBSS in past. Says she has been on ppis in past but quit as she has not had reflux problems and they did not improve the dysphagia. Says this is usually in her mid to upper neck, and is no worse. Happens with both liquids/solids. No other GI concerns   Clinical Impression Pt reports intermittent globus sensation and points to mid sternal area. She reports that the sensation is not specific to a certain food texture or liquid. She also reports a hypersensitive gag reflex with vomiting during previous barium swallow. During this study, pt presents with adequate oropharyngeal abilities and great airway protection when consuming thin liquids via cup (even with consecutive sips), puree, graham crackers with barium paste and whole barium tablet with thin liquid barium. There was also swift movement of the tablet thru the upper esophagus (per radiologist). At this time, pt's risk of aspiration appears reduced when following general aspiration precautions (reviewed with pt) and consuming regular diet with thin liquids and medicine whole with thin liquids. Pt voiced understanding with no further skilled ST services indicated. Factors that may increase risk of adverse event in presence of aspiration Noe & Lianne 2021):  N/A  Swallow Evaluation Recommendations Recommendations: PO diet PO  Diet Recommendation: Regular;Thin liquids (Level 0) Liquid Administration via: Cup;Straw Medication Administration: Whole meds with liquid Supervision: Patient able to self-feed Swallowing strategies  : Minimize environmental distractions;Slow rate;Small bites/sips Postural changes: Position pt fully upright for meals;Stay upright 30-60 min after meals Oral care recommendations: Oral care BID (2x/day)    Yehonatan Grandison B. Rubbie, M.S., CCC-SLP, Tree surgeon Certified Brain Injury Specialist [facility]  [facility] Services Office [phone] Ascom [phone] Fax [phone]

## 2023-09-24 ENCOUNTER — Other Ambulatory Visit: Payer: Self-pay | Admitting: Family Medicine

## 2023-09-24 DIAGNOSIS — M5416 Radiculopathy, lumbar region: Secondary | ICD-10-CM

## 2023-09-27 ENCOUNTER — Ambulatory Visit
Admission: RE | Admit: 2023-09-27 | Discharge: 2023-09-27 | Disposition: A | Discharge status: Home / Self Care | Source: Ambulatory Visit | Attending: Family Medicine | Admitting: Family Medicine

## 2023-09-27 DIAGNOSIS — M5416 Radiculopathy, lumbar region: Secondary | ICD-10-CM

## 2023-10-02 ENCOUNTER — Inpatient Hospital Stay
Admission: RE | Admit: 2023-10-02 | Discharge: 2023-10-02 | Disposition: A | Discharge status: Home / Self Care | Payer: Self-pay | Source: Ambulatory Visit | Attending: Neurosurgery | Admitting: Neurosurgery

## 2023-10-02 ENCOUNTER — Other Ambulatory Visit: Payer: Self-pay | Admitting: Family Medicine

## 2023-10-02 DIAGNOSIS — Z049 Encounter for examination and observation for unspecified reason: Secondary | ICD-10-CM

## 2023-10-03 ENCOUNTER — Encounter: Payer: Self-pay | Admitting: Neurosurgery

## 2023-10-03 NOTE — Progress Notes (Signed)
 Referring Physician:  Carlisle Benton CROME, FNP [address] [place],  [place] [address]  Primary Physician:  Auston Reyes BIRCH, MD  History of Present Illness: 10/09/2023 [name] is here today with a chief complaint of chronic neck pain and diabetes who presents with arm pain and numbness.   She experiences pain radiating from her neck to her fingers, with the most severe pain between her neck and elbows. Numbness and tingling extend to all fingertips. She has had neck pain for 14 years, linked to previous back issues. A history of shoulder surgery provided temporary relief, but pain returned, possibly due to physical strain. She underwent a low back fusion in 2007. Physical therapy was attempted without benefit. An MRI from November was part of her workup.  She has difficulty swallowing, with episodes of feeling something stuck in her throat, even when drinking water. A swallowing evaluation was unremarkable. Bowel/Bladder Dysfunction: none  Conservative measures:  Physical therapy:  has participated at [facility] 1 time Multimodal medical therapy including regular antiinflammatories:  Robaxin, naproxen, Lyrica and tramadol  Injections:  06/03/2023: Left C5-6 transforaminal ESI (3-4 weeks of relief, dexamethasone  8 mg, hemoglobin A1c of 8.6)  03/21/2023: Left C5-6 transforaminal ESI (4 weeks of relief then return of pain)   Past Surgery:  2 level fusion back surgery-year? Who? 02/14/2022-SHOULDER ARTHROSCOPY WITH SUBACROMIAL DECOMPRESSION, ROTATOR CUFF REPAIR AND BICEP TENDON REPAIR   Taylor [name] has no symptoms of cervical myelopathy.  The symptoms are causing a significant impact on the patient's life.   I have utilized the care everywhere function in epic to review the outside records available from external health systems.  Review of Systems:  A 10 point review of systems is negative, except for the pertinent positives and negatives detailed in the  HPI.  Past Medical History: Past Medical History:  Diagnosis Date   Anemia    Anxiety    a.) on BZO (clonazepam) PRN   Aortic atherosclerosis (HCC)    Arthritis    B12 deficiency    Bilateral carotid artery disease (HCC)    Chronic back pain    Coronary artery calcification seen on CT scan    Depression    Diastolic dysfunction 09/01/2019   a.) TTE 09/01/2019: >55%; mild LA enlargement; triv PR, mild MR/TR; G1DD.   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   OSA (obstructive sleep apnea)    a.) does not require nocturnal PAP therapy   Pneumonia    Psoriatic arthritis (HCC)    a.) on Ixekizumab (Taltz)   Sensory polyneuropathy    T2DM (type 2 diabetes mellitus) (HCC)     Past Surgical History: Past Surgical History:  Procedure Laterality Date   ABDOMINAL HYSTERECTOMY     BACK SURGERY     2 level fusion   BREAST BIOPSY Left 08/23/2013   stereo, negative   BUNIONECTOMY Bilateral    carpal tunnel Right    COLONOSCOPY WITH ESOPHAGOGASTRODUODENOSCOPY (EGD)     DILATION AND CURETTAGE OF UTERUS     KNEE ARTHROSCOPY Left 05/16/2021   Procedure: ARTHROSCOPY KNEE;  Surgeon: Mardee Lynwood SQUIBB, MD;  Location:  [facility] ORS;  Service: Orthopedics;  Laterality: Left;   SHOULDER ARTHROSCOPY WITH SUBACROMIAL DECOMPRESSION, ROTATOR CUFF REPAIR AND BICEP TENDON REPAIR Left 02/14/2022   Procedure: SHOULDER ARTHROSCOPY WITH DEBRIDEMENT, DECOMPRESSION, ROTATOR CUFF REPAIR AND BICEPS TENODESIS.;  Surgeon: Edie Norleen PARAS, MD;  Location: [facility] ORS;  Service: Orthopedics;  Laterality: Left;   tendinitis Right    wrist   TRIGGER FINGER RELEASE Right    x 2 thumb and middle finger     Allergies: Allergies as of 10/09/2023 - Review Complete 10/09/2023  Allergen Reaction Noted   Aspirin Hives and Swelling 10/04/2013   Penicillin g Hives 06/19/2013   Penicillins Hives 10/04/2013    Medications:  Current Outpatient Medications:    ADVAIR HFA 115-21 MCG/ACT inhaler, Inhale 2 puffs into the lungs., Disp: , Rfl:    albuterol  (VENTOLIN  HFA) 108 (90 Base) MCG/ACT inhaler, Inhale 2 puffs into the lungs., Disp: , Rfl:    BIMZELX 320 MG/2ML pen, Inject 320 mg into the skin., Disp: , Rfl:    bisoprolol-hydrochlorothiazide (ZIAC) 10-6.25 MG tablet, Take 1 tablet by mouth daily., Disp: , Rfl:    buPROPion (WELLBUTRIN XL) 150 MG 24 hr tablet, Take 150 mg by mouth daily., Disp: , Rfl:    busPIRone (BUSPAR) 10 MG tablet, Take 1 tablet by mouth 2 (two) times daily., Disp: , Rfl:    clonazePAM (KLONOPIN) 0.5 MG tablet, Take 0.5 mg by mouth 2 (two) times daily as needed for anxiety., Disp: , Rfl:    clopidogrel (PLAVIX) 75 MG tablet, Take 75 mg by mouth daily., Disp: , Rfl:    Ergocalciferol (VITAMIN D2 PO), Take 50,000 Units by mouth once a week., Disp: , Rfl:    escitalopram (LEXAPRO) 20 MG tablet, Take 20 mg by mouth daily., Disp: , Rfl:    glimepiride (AMARYL) 4 MG tablet, Take 4 mg by mouth daily., Disp: , Rfl:    levothyroxine (SYNTHROID) 50 MCG tablet, Take 75 mcg by mouth daily before breakfast., Disp: , Rfl:    Naproxen Sodium 220 MG CAPS, Take 440 mg by mouth., Disp: , Rfl:    nortriptyline (PAMELOR) 25 MG capsule, Take 25 mg by mouth at bedtime., Disp: , Rfl:    pregabalin (LYRICA) 50 MG capsule, Take 50 mg by mouth 2 (two) times daily., Disp: , Rfl:    rosuvastatin (CRESTOR) 40 MG tablet, Take 40 mg by mouth daily., Disp: , Rfl:    traMADol (ULTRAM) 50 MG tablet, Take by mouth., Disp: , Rfl:    vitamin B-12 (CYANOCOBALAMIN) 1000 MCG tablet, Take 1,000 mcg by mouth every 30 (thirty) days. , Disp: , Rfl:    amitriptyline (ELAVIL) 10 MG tablet, Take 10 mg by mouth at  bedtime as needed for sleep. (Patient not taking: Reported on 03/18/2022), Disp: , Rfl:    oxyCODONE  (ROXICODONE ) 5 MG immediate release tablet, Take 1-2 tablets (5-10 mg total) by mouth every 4 (four) hours as needed for moderate pain or severe pain. (Patient not taking: Reported on 04/11/2023), Disp: 40 tablet, Rfl: 0  Social History: Social History   Tobacco Use   Smoking status: Former    Current packs/day: 0.00    Types: Cigarettes    Quit date: 03/11/2016    Years since quitting: 7.5   Smokeless tobacco: Never  Vaping Use   Vaping status: Former  Substance Use Topics   Alcohol use: Not Currently    Comment: occasional   Drug use: No    Family Medical History: Family History  Problem  Relation Age of Onset   Breast cancer Mother 83    Physical Examination: Vitals:   10/09/23 0853  BP: 110/76    General: Patient is in no apparent distress. Attention to examination is appropriate.  Neck:   Supple.  Full range of motion.  Respiratory: Patient is breathing without any difficulty.   NEUROLOGICAL:     Awake, alert, oriented to person, place, and time.  Speech is clear and fluent.   Cranial Nerves: Pupils equal round and reactive to light.  Facial tone is symmetric.  Facial sensation is symmetric. Shoulder shrug is symmetric. Tongue protrusion is midline.  There is no pronator drift.  Strength: Side Biceps Triceps Deltoid Interossei Grip Wrist Ext. Wrist Flex.  R 5 5 5 5 5 5 5   L 5 5 5 5 5 5 5    Side Iliopsoas Quads Hamstring PF DF EHL  R 5 5 5 5 5 5   L 5 5 5 5 5 5    Reflexes are 1+ and symmetric at the biceps, triceps, brachioradialis, patella and achilles.   Hoffman's is absent.   Bilateral upper and lower extremity sensation is intact to light touch.    No evidence of dysmetria noted.  Gait is normal.    +TTP L anterior shoulder.  Able to extend her arms completely.  Medical Decision Making  Imaging: MRI C spine 02/03/2023 IMPRESSION: 1. Multilevel  cervical spondylosis with resultant mild diffuse spinal stenosis at C3-4 through C6-7. 2. Multifactorial degenerative changes with resultant multilevel foraminal narrowing as above. Notable findings include severe left C4 foraminal stenosis, moderate bilateral C5 foraminal narrowing, severe left with moderate right C6 foraminal stenosis, with mild to moderate left C7 and T1-2 foraminal narrowing.     Electronically Signed   By: Morene Hoard M.D.   On: 02/08/2023 01:06    I have personally reviewed the images and agree with the above interpretation.  Assessment and Plan: Ms. Tatem is a pleasant 63 y.o. female with cervical radiculopathy due to cervical stenosis.  She has severe nerve root compression due to cervical stenosis.  She has seen her orthopedic surgeon who did not feel that her shoulders were causing her pain.  She has tried physical therapy but was discharged due to being a poor candidate.  At this point, no further conservative treatment is recommended.  I have recommended anterior cervical discectomy and fusion at C4-6  I discussed the planned procedure at length with the patient, including the risks, benefits, alternatives, and indications. The risks discussed include but are not limited to bleeding, infection, need for reoperation, spinal fluid leak, stroke, vision loss, anesthetic complication, coma, paralysis, and even death. We also discussed the possibility of post-operative dysphagia, vocal cord paralysis, and the risk of adjacent segment disease in the future. I also described in detail that improvement was not guaranteed.  The patient expressed understanding of these risks, and asked that we proceed with surgery. I described the surgery in layman's terms, and gave ample opportunity for questions, which were answered to the best of my ability.    Thank you for involving me in the care of this patient.      Promise Weldin K. Clois MD, [facility] Neurosurgery

## 2023-10-09 ENCOUNTER — Ambulatory Visit (INDEPENDENT_AMBULATORY_CARE_PROVIDER_SITE_OTHER): Admitting: Neurosurgery

## 2023-10-09 ENCOUNTER — Encounter: Payer: Self-pay | Admitting: Neurosurgery

## 2023-10-09 VITALS — BP 110/76 | Ht 65.0 in | Wt 247.0 lb

## 2023-10-09 DIAGNOSIS — M5412 Radiculopathy, cervical region: Secondary | ICD-10-CM

## 2023-10-09 DIAGNOSIS — M4802 Spinal stenosis, cervical region: Secondary | ICD-10-CM

## 2023-10-09 NOTE — Patient Instructions (Addendum)
 Per Dr Mardee, wait 6-8 weeks after knee surgery for neck surgery  *If your diabetes, blood thinner, or rheumatology medications change before surgery please let us  know  *Colonoscopy can be done before surgery, or wait 3 months after surgery  *OK to proceed with lumbar injection on 8/6  *Return to clinic prior to surgery for an updated MRI if you have a change in symptoms. Otherwise, no need to return prior to surgery  Please see below for information in regards to your upcoming surgery:   Planned surgery: C4-6 anterior cervical discectomy and fusion   Surgery date: 02/09/24 at [facility] (Medical Mall: [address], [place], [place] [address]) - you will find out your arrival time the business day before your surgery.   Pre-op appointment at [facility] Pre-admit Testing: you will receive a call with a date/time for this appointment. If you are scheduled for an in person appointment, Pre-admit Testing is located on the first floor of the [facility] building, [address], [address]. During this appointment, they will advise you which medications you can take the morning of surgery, and which medications you will need to hold for surgery. Labs (such as blood work, EKG) may be done at your pre-op appointment. You are not required to fast for these labs. Should you need to change your pre-op appointment, please call Pre-admit testing at [phone].     Bimzelx injections: hold 1 month before and 1 month after surgery     Blood thinners:   Plavix:     Stop Plavix 7 days prior, resume Plavix 14 days after     Surgical clearance: we will send a clearance form to Dr Auston (PCP) and Annalee Casa (Cardiology). They may wish to see you in their office prior to signing the clearance form. If so, they may call you to schedule an appointment.     NSAIDS (Non-steroidal anti-inflammatory drugs): because you are having a fusion, please  avoid taking any NSAIDS (examples: ibuprofen, motrin, aleve, naproxen, meloxicam, diclofenac) for 3 months after surgery. Celebrex  is an exception and is OK to take, if prescribed. Tylenol  is not an NSAID.    Common restrictions after spine surgery: No bending, lifting, or twisting ("BLT"). Avoid lifting objects heavier than 10 pounds for the first 6 weeks after surgery. Where possible, avoid household activities that involve lifting, bending, reaching, pushing, or pulling such as laundry, vacuuming, grocery shopping, and childcare. Try to arrange for help from friends and family for these activities while you heal. Do not drive while taking prescription pain medication. Weeks 6 through 12 after surgery: avoid lifting more than 25 pounds.    X-rays after surgery: Because you are having a fusion: for appointments after your 2 week follow-up: please arrive at the [facility] outpatient imaging center ([address], [address], [place]) or [facility] one hour prior to your appointment for x-rays. This applies to every appointment after your 2 week follow-up. Failure to do so may result in your appointment being rescheduled. *We have started construction to have xray in our office - this may be done by the time you see us  after surgery   How to contact us :  If you have any questions/concerns before or after surgery, you can reach us  at [phone], or you can send a mychart message. We can be reached by phone or mychart 8am-4pm, Monday-Friday.  *Please note: Calls after 4pm are forwarded to a third party answering service.  [employer] messages are not routinely monitored during evenings, weekends, and holidays. Please call our office to contact the answering service for urgent concerns during non-business hours.   If you have FMLA/disability paperwork, please drop it off or fax it to [phone]   Appointments/FMLA & disability paperwork: [employer] Registered  Nurse/Surgery scheduler: Kingslee Dowse, RN Certified Medical Assistants: Don, CMA, Elenor, CMA, & Damien, CMA Physician Assistants: Lyle Decamp, PA-C, Edsel Goods, PA-C & Glade Boys, PA-C Surgeons: Penne Sharps, MD & Reeves Daisy, MD   [facility] PREADMIT TESTING VISIT and SURGERY INFORMATION SHEET   Now that surgery has been scheduled you can anticipate several phone calls from [facility] services. A pharmacy technician will call you to verify your current list of medications taken at home.               The Pre-Service Center will call to verify your insurance information and to give you billing estimates and information.             The Preadmit Testing Office will be calling to schedule a visit to obtain information for the anesthesia team and provide instructions on preparation for surgery.  What can you expect for the Preadmit Testing Visit: Appointments may be scheduled in-person or by telephone.  If a telephone visit is scheduled, you may be asked to come into the office to have lab tests or other studies performed.   This visit will not be completed any greater than 14 days prior to your surgery.  If your surgery has been scheduled for a future date, please do not be alarmed if we have not contacted you to schedule an appointment more than a month prior to the surgery date.    Please be prepared to provide the following information during this appointment:            -Personal medical history                                               -Medication and allergy list            -Any history of problems with anesthesia              -Recent lab work or diagnostic studies            -Please notify us  of any needs we should be aware of to provide the best care possible           -You will be provided with instructions on how to prepare for your surgery.    On The Day of Surgery:  You must have a driver to take you home after surgery, you will be  asked not to drive for 24 hours following surgery.  Taxi, Gisele and non-medical transport will not be acceptable means of transportation unless you have a responsible individual who will be traveling with you.  Visitors in the surgical area:   2 people will be able to visit you in your room once your preparation for surgery has been completed. During surgery, your visitors will be asked to wait in the Surgery Waiting Area.  It is not a requirement for them to stay, if they prefer to leave and come back.  Your visitor(s) will be given an update once the surgery has been completed.  No visitors are allowed in the  initial recovery room to respect patient privacy and safety.  Once you are more awake and transfer to the secondary recovery area, or are transferred to an inpatient room, visitors will again be able to see you.  To respect and protect your privacy: We will ask on the day of surgery who your driver will be and what the contact number for that individual will be. We will ask if it is okay to share information with this individual, or if there is an alternative individual that we, or the surgeon, should contact to provide updates and information. If family or friends come to the surgical information desk requesting information about you, who you have not listed with us , no information will be given.   It may be helpful to designate someone as the main contact who will be responsible for updating your other friends and family.    PREADMIT TESTING OFFICE: [phone] SAME DAY SURGERY: [phone] We look forward to caring for you before and throughout the process of your surgery.

## 2023-10-09 NOTE — Addendum Note (Signed)
 Addended by: Amadi Frady on: 10/09/2023 10:07 AM   Modules accepted: Orders

## 2023-10-24 ENCOUNTER — Other Ambulatory Visit: Payer: Self-pay | Admitting: Internal Medicine

## 2023-10-24 DIAGNOSIS — Z1231 Encounter for screening mammogram for malignant neoplasm of breast: Secondary | ICD-10-CM

## 2023-11-04 NOTE — Progress Notes (Signed)
 HPI The patient is a pleasant 63 year old right hand dominate female who presents today for right greater than left low back pain with radiation to the right posterior and lateral thigh and calf. Pain is rated moderate to severe that is sharp aching tight with a description of heaviness and this is constant beginning in early 2025. she has associated feeling of numbness tingling and weakness.    She has been followed in the past for acute on chronic bilateral posterior cervical spine pain left greater than right to the bilateral trapezius ridge with intermittent pain in the arms and numbness in hands. She had had carpal tunnel release on the right. She had left shoulder surgery in 2023.  Her pain is described as moderate to severe that is sharp, dull, throbbing, stiff, aching and burning.  Pain is constant with associated feeling of numbness and tingling and weakness.  Her pain is progressively increasing beginning in 2023. Pain is worsened with bending, twisting, lying in bed, sitting, coughing, sneezing, moving head side-to-side.  Heat and ice can allow for mild relief. Denies any weakness in her arms.  Able to perform fine motor tasks.  Medications have included Robaxin, naproxen, Lyrica and tramadol all have provided mild relief tramadol is most helpful.   Patient is on Plavix.  Patient is disabled.  Her husband passed away in April 22, 2023 She was last evaluated on 09/30/2023 at which time she was experiencing bilateral low back pain with radiating pain to the right lower extremity occasionally to the left.  She was to continue with tramadol 50 mg 1 tablet twice daily and physical therapy previously was not beneficial.  She was referred to neurosurgery and she is scheduled for an epidural steroid injection.  MRI of the lumbar spine revealed previous fusion without complicating factors and mild stenosis at L3-4.  At today's visit she has noted 60% relief of pain in her leg and back. She states she  is happy with her level of pain relief and wishes to go and schedule for another injection in November to keep her pain manageable.  She is planning to have knee surgery in October and the cervical spine surgery in December.  She has been very watchful over her blood sugars.  She did have slight elevation after injection.  The [employer]  Narcotic Database was reviewed today.  The patient has signed a Curator contract (02/26/2023). We have discussed the realistic benefits and known risk of opioid therapy.  Patient verbalized understanding. Dr. Avanell and I dicussed the role of long term narcotics for this patient and is in agreeance with the medication prescribed.   Procedures:  10/15/2023: Right L3-4 transforaminal ESI (60% relief, dexamethasone  10 mg, hemoglobin A1c of 7.5) 06/03/2023: Left C5-6 transforaminal ESI (3-4 weeks of relief, dexamethasone  8 mg, hemoglobin A1c of 8.6)  03/21/2023: Left C5-6 transforaminal ESI (4 weeks of relief then return of pain) 10/21/2022: RFA to the left knee 04/03/2022: RFA to the left knee (90% relief x 6 months) 02/08/2022: Left genicular nerve block (10/10 to 0/10)  01/09/2022: Left genicular nerve block (10/10 to 0/10)   Past Medical History:  Diagnosis Date  . Anemia   . Back pain   . Chickenpox   . Diabetes mellitus type 2, uncomplicated (CMS/HHS-HCC)    Gestational  . Esophageal spasm   . Hx of migraine headaches   . Hyperlipidemia   . Hypertension   . Psoriasis   . Thyroid  nodule    thyroid  ultrasound  03/2014    Past Surgical History:  Procedure Laterality Date  . COLONOSCOPY  09/17/2013   Indic: Screening. 1 Tubular Adenoma.  . COLONOSCOPY  08/13/2018   Indic: Diarrhea. Descending diverticulitis. Random bx - normal colonic mucosa. PHx polyps - [facility] 08/2023  . EGD  08/13/2018   Indic: Dysphagia, GERD. Normal esophagus. Normal stomach (bx - reactive foveolar hyperplasia, fibrosis, and mixed inflammation c/w tissue  adjacent to erosion; minimal chronic gastritis). Duodenitis in 2nd portion of duodenum (path - normal duodenal mucosa).  . Left knee arthroscopy, partial medial meniscectomy, and chondroplasty  05/16/2021   Dr Mardee  . back surgery    . BUNION CORRECTION    . CESAREAN SECTION    . ENDOSCOPIC CARPAL TUNNEL RELEASE Right   . HYSTERECTOMY    . RIGHT LONG TRIGGER FINGER RELEASE    . RIGHT TRIGGER THUMB RELEASE    . Tendon surgery    . TRIGGER FINGER RELEASE      Social History   Socioeconomic History  . Marital status: Married    Spouse name: Marcey  . Number of children: 3  . Years of education: 78  . Highest education level: High school graduate  Occupational History  . Occupation: Disabled  Tobacco Use  . Smoking status: Former    Current packs/day: 0.00    Types: Cigarettes    Quit date: 05/05/2016    Years since quitting: 7.5    Passive exposure: Past  . Smokeless tobacco: Never  . Tobacco comments:    Smokes only when she drinks on occasions.  Vaping Use  . Vaping status: Never Used  Substance and Sexual Activity  . Alcohol use: Not Currently    Comment: 1-2 mixed drinks  . Drug use: Never  . Sexual activity: Defer    Partners: Male   Social Drivers of Health   Financial Resource Strain: Low Risk  (09/11/2023)   Overall Financial Resource Strain (CARDIA)   . Difficulty of Paying Living Expenses: Not hard at all  Food Insecurity: No Food Insecurity (09/11/2023)   Hunger Vital Sign   . Worried About Programme researcher, broadcasting/film/video in the Last Year: Never true   . Ran Out of Food in the Last Year: Never true  Recent Concern: Food Insecurity - Food Insecurity Present (08/14/2023)   Hunger Vital Sign   . Worried About Programme researcher, broadcasting/film/video in the Last Year: Sometimes true   . Ran Out of Food in the Last Year: Sometimes true  Transportation Needs: No Transportation Needs (09/11/2023)   PRAPARE - Transportation   . Lack of Transportation (Medical): No   . Lack of Transportation  (Non-Medical): No    Family History  Problem Relation Name Age of Onset  . Breast cancer Mother    . Bone cancer Mother    . Hyperlipidemia (Elevated cholesterol) Mother    . High blood pressure (Hypertension) Mother    . Diabetes type II Mother    . Deep vein thrombosis (DVT or abnormal blood clot formation) Father    . Myocardial Infarction (Heart attack) Father      Current Outpatient Medications on File Prior to Visit  Medication Sig Dispense Refill  . ADVAIR HFA 115-21 mcg/actuation inhaler Inhale 2 inhalations into the lungs every 12 (twelve) hours 36 g 3  . albuterol  MDI, PROVENTIL , VENTOLIN , PROAIR , HFA 90 mcg/actuation inhaler Inhale 2 inhalations into the lungs every 6 (six) hours as needed for up to 90 days VENTOLIN  ONLY PLEASE 54 g  3  . bimekizumab-bkzx (BIMZELX AUTOINJECTOR) 320 mg/2 mL AtIn Inject 320 mg subcutaneously every 8 (eight) weeks MAINTENANCE DOSE  Inject 320MG  ( two 160MG  injections) SQ every 8 weeks 6 mL 1  . bisoproloL-hydroCHLOROthiazide (ZIAC) 10-6.25 mg tablet Take 1 tablet by mouth once daily 90 tablet 1  . buPROPion (WELLBUTRIN XL) 150 MG XL tablet Take 1 tablet (150 mg total) by mouth once daily 30 tablet 5  . busPIRone (BUSPAR) 10 MG tablet Take 1 tablet (10 mg total) by mouth 2 (two) times daily 60 tablet 11  . clonazePAM (KLONOPIN) 0.5 MG tablet Take 1 tablet (0.5 mg total) by mouth 3 (three) times daily as needed for Anxiety 60 tablet 2  . clopidogreL (PLAVIX) 75 mg tablet Take 1 tablet (75 mg total) by mouth once daily 30 tablet 0  . escitalopram oxalate (LEXAPRO) 20 MG tablet TAKE 1 TABLET(20 MG) BY MOUTH DAILY 90 tablet 3  . glimepiride (AMARYL) 4 MG tablet Take 1 tablet (4 mg total) by mouth 2 (two) times daily 180 tablet 1  . levothyroxine (SYNTHROID) 50 MCG tablet Take 1.5 tablets (75 mcg total) by mouth once daily Take on an empty stomach with a glass of water at least 30-60 minutes before breakfast. 135 tablet 1  . naproxen sodium 220 mg Cap  Take 440 mg by mouth every 6 (six) hours as needed    . nortriptyline (PAMELOR) 25 MG capsule Take 1 capsule (25 mg total) by mouth at bedtime 30 capsule 5  . omeprazole (PRILOSEC) 20 MG DR capsule Take 20 mg by mouth once daily as needed Take 30 minutes before breakfast.    . pregabalin (LYRICA) 200 MG capsule Take 1 capsule (200 mg total) by mouth 2 (two) times daily 60 capsule 5  . rosuvastatin (CRESTOR) 40 MG tablet Take 1 tablet (40 mg total) by mouth once daily 90 tablet 1  . TORsemide (DEMADEX) 20 MG tablet TAKE 1 TABLET(20 MG) BY MOUTH DAILY AS NEEDED FOR UP TO 90 DAYS 90 tablet 0  . traMADoL (ULTRAM) 50 mg tablet Take 1 tablet (50 mg total) by mouth 2 (two) times daily as needed for Pain 60 tablet 2  . ergocalciferol, vitamin D2, 1,250 mcg (50,000 unit) capsule Take 1 capsule (50,000 Units total) by mouth once a week for 30 days 12 capsule 3   Current Facility-Administered Medications on File Prior to Visit  Medication Dose Route Frequency Provider Last Rate Last Admin  . cyanocobalamin (VITAMIN B12) injection 1,000 mcg  1,000 mcg Intramuscular Q30 Days Auston Reyes BIRCH, MD   1,000 mcg at 09/22/23 9074    Allergies as of 11/04/2023 - Reviewed 11/04/2023  Allergen Reaction Noted  . Aspirin Hives 06/19/2013  . Penicillin g Hives 06/19/2013    ROS More than 10 system, review of system form was given to the patient to fill out and has been signed by [employer] FNP-C and scanned into the patient's chart.   Vital signs Vitals:   11/04/23 0954  BP: (!) 143/89  Pulse: 79  Temp: 36.8 C (98.3 F)  TempSrc: Oral  Weight: (!) 110.7 kg (244 lb 0.8 oz)  Height: 165.1 cm (5' 5)  PainSc:   5  PainLoc: Back    Exam General: Alert oriented obese no distress   Cervical Exam (performed 07/16/2023) Cervical rotation is full.  Spurling's maneuver produces posterior cervical spine radiating to the bilateral trapezius ridge.  Forward lection hyperextension does not produce significant  pain.  Upper Extremity Exam  She has 5/5 strength in bilateral wrist extensors, biceps, triceps deltoids, shoulder internal and external rotators.  Sensation intact to light touch bilaterally.  She has trace bilateral bicep, tricep and brachialis reflexes bilaterally.  Negative Hoffmann's bilaterally.  Lumbosacral Exam (09/24/2023) Upon inspection there are no rashes.  Patient has mild tenderness upon palpation to the lumbosacral paraspinal musculature.  Extension rotation does not produce pain in the low back.  Forward flexion is limited producing low back pain.  Lower extremity exam The patient has 5/5 strength in the left dorsiflexor, knee extensor and hip Lexer.  She has 4/5 strength in the right dorsiflexor, 5/5 strength in the right knee extensor and 4/5 strength in the right hip flexor.  Sensation altered in the form of paresthesia patient states that this has been ongoing since surgery in 2011.  Send bilateral patellar and Achilles reflexes.  No ankle clonus.  Straight leg raise is negative bilaterally.  Full range of motion to bilateral hip joints without pain elicited.  Radiographic Data   Impression 1.  Acute on chronic right lower extremity pain rating to the posterior lateral thigh and calf with a feeling of heaviness in the leg.  She has weakness on exam in the dorsiflexor and hip flexor.  The stat MRI has been ordered for further evaluation of lumbar radiculitis and possible spinal cord involvement.  MRI lumbar spine without contrast dated 09/27/2023 imaging and report reviewed today. TECHNIQUE:  Multiplanar, multisequence MR imaging of the lumbar spine was  performed. No intravenous contrast was administered.  COMPARISON:  Remote MRI from 2011.  CT myelogram from 2015.  FINDINGS:  Segmentation: There are five lumbar type vertebral bodies. The last  full intervertebral disc space is labeled L5-S1. This correlates  with the prior imaging studies.  Alignment:  Mild degenerative  retrolisthesis of L2 and L3.  Vertebrae: Artifact associated with the anterior and interbody  fusion hardware at L4-5 and L5-S1. No bone lesions or fractures.  Conus medullaris and cauda equina: Conus extends to the T12-L1  level. Conus and cauda equina appear normal.  Paraspinal and other soft tissues: No significant paraspinal or  retroperitoneal findings.   Disc levels:  T12-L1: No significant findings.  L1-2: Mild annular bulge with slight flattening of the thecal sac  but the spinal canal is generous and there is no spinal or foraminal  stenosis. Mild facet disease.  L2-3: Bulging and slightly uncovered disc and facet disease  contributing to mild bilateral lateral recess stenosis but no  significant spinal foraminal stenosis.  L3-4: Bulging and slightly uncovered disc in combination with facet  disease and ligamentum flavum thickening contributing to mild spinal  stenosis and moderate bilateral lateral recess stenosis.  Extraforaminal encroachment noted on the left L3 nerve root.  L4-5: Anterior and interbody fusion changes. No obvious solid  interbody fusion at L4-5. Slight osteophytic ridging with mild  flattening of the ventral thecal sac but no spinal, lateral recess  or foraminal stenosis.  L5-S1: Anterior and interbody fusion hardware without obvious  complicating features. No spinal foraminal stenosis.  IMPRESSION:  1. Anterior and interbody fusion hardware at L4-5 and L5-S1 without  obvious complicating features.  2. Mild spinal stenosis and moderate bilateral lateral recess  stenosis at L3-4. Extraforaminal encroachment on the left L3 nerve  root.  3. Mild bilateral lateral recess stenosis at L2-3.  Electronically Signed    By: MYRTIS Stammer M.D.    On: 09/27/2023 14:37   Lumbar spine x-rays from [facility] dated  07/16/2023 revealed prior fusion without complications  2.  Acute on chronicchronic bilateral posterior cervical spine pain left greater than right  to the bilateral trapezius ridge with intermittent pain in the arms and numbness in hands.  Clinically her symptoms most consistent with her cervical disc herniations and spinal stenosis and cervical radiculitis.MRI from [facility] dated 02/03/2023 both images and report were reviewed today. COMPARISON:  Prior CT from 10/08/2013  FINDINGS:  Alignment: Straightening of the normal cervical lordosis. Trace  facet mediated anterolisthesis of C7 on T1.  Vertebrae: Vertebral body height maintained without acute or chronic  fracture. Bone marrow signal intensity within normal limits. Small  benign hemangioma noted within the C4 vertebral body. No worrisome  osseous lesions. No abnormal marrow edema.  Cord: Normal signal and morphology.  Posterior Fossa, vertebral arteries, paraspinal tissues:  Unremarkable.  Disc levels:  C2-C3: Negative interspace. Moderate left with mild right facet  hypertrophy. No spinal stenosis. Foramina remain patent.  C3-C4: Central to left paracentral disc protrusion indents the  ventral thecal sac (series 110, image 15). Superimposed endplate and  uncovertebral spurring with advanced left-sided facet arthrosis.  Mild spinal stenosis with severe left C4 foraminal narrowing. Right  neural foramen remains patent.  C4-C5: Shallow broad-based left paracentral disc osteophyte complex  flattens and partially indents the ventral thecal sac (series 110,  images 19, 20). Mild cord flattening without cord signal changes.  Mild spinal stenosis. Moderate bilateral C5 foraminal stenosis.  C5-C6: Mild degenerative intervertebral disc space narrowing with  diffuse circumferential disc osteophyte complex. Superimposed small  left paracentral disc protrusion indents the ventral thecal sac  (series 110, image 23). Minimal cord flattening without cord signal  changes. Mild spinal stenosis. Severe left with moderate right C6  foraminal narrowing.  C6-C7: Degenerative intervertebral disc  space narrowing with  circumferential disc osteophyte complex. Flattening and partial  effacement of the ventral thecal sac with resultant mild spinal  stenosis. Mild to moderate left C7 foraminal stenosis. Right neural  foramen remains adequately patent.  C7-T1: Trace anterolisthesis. Minimal disc bulge. Moderate  left-sided facet hypertrophy. No spinal stenosis. Mild left C8  foraminal narrowing. Right neural foramen remains patent.  T1-2: Seen only on sagittal projection. Diffuse disc bulge with  reactive endplate spurring. No significant spinal stenosis. Mild to  moderate right worse than left foraminal stenosis.  T2-3: Seen only on sagittal projection. Mild disc bulge. No  significant spinal stenosis. Foramina appear patent.  IMPRESSION:  1. Multilevel cervical spondylosis with resultant mild diffuse  spinal stenosis at C3-4 through C6-7.  2. Multifactorial degenerative changes with resultant multilevel  foraminal narrowing as above. Notable findings include severe left  C4 foraminal stenosis, moderate bilateral C5 foraminal narrowing,  severe left with moderate right C6 foraminal stenosis, with mild to  moderate left C7 and T1-2 foraminal narrowing.  Electronically Signed    By: Morene Hoard M.D.    On: 02/08/2023 01:06   3.  Hypertension 4.  Type 2 diabetes last A1c was 7.2 in October 2024 5.  Coronary artery disease patient is on Plavix 6.  Chronic low back pain history of lumbar spine fusion in 2011. 7.  Left knee pain planning for surgery in fall 2025  Plan 1.  We will avoid all NSAIDs as patient is on Plavix 2.  Tramadol 50 mg 1 tablet twice daily as needed #60 (she has been taking 2 tablets BID, this is ok for now) She will not take with klonopin and has not had to  take this in several months  3.  Physical therapy at [facility] clinic for her neck was not helpful 4.  Continue with referral to neurosurgery with Dr. Clois planning for surgery December  5.   Order placed for L3-4 TF ESI after 01/16/2024 stay on Plavix  6.  She will follow up 3 weeks after [facility]   Social Drivers of Health with Concerns   Tobacco Use: Medium Risk (11/04/2023)   Patient History   . Smoking Tobacco Use: Former   . Smokeless Tobacco Use: Never   . Passive Exposure: Past  Food Insecurity: No Food Insecurity (09/11/2023)   Hunger Vital Sign   . Worried About Programme researcher, broadcasting/film/video in the Last Year: Never true   . Ran Out of Food in the Last Year: Never true  Recent Concern: Food Insecurity - Food Insecurity Present (08/14/2023)   Hunger Vital Sign   . Worried About Programme researcher, broadcasting/film/video in the Last Year: Sometimes true   . Ran Out of Food in the Last Year: Sometimes true  Depression: Moderately severe depression (08/06/2023)   PHQ-9   . PHQ-9 Score: 19  Housing Stability: Low Risk  (09/11/2023)   Housing Stability Vital Sign   . Unable to Pay for Housing in the Last Year: No   . Number of Times Moved in the Last Year: 0   . Homeless in the Last Year: No  Recent Concern: Housing Stability - High Risk (08/14/2023)   Housing Stability Vital Sign   . Unable to Pay for Housing in the Last Year: Yes   . Number of Times Moved in the Last Year: 0   . Homeless in the Last Year: No  Utilities: Not At Risk (09/11/2023)   [facility] Utilities   . Threatened with loss of utilities: No  Recent Concern: Utilities - At Risk (08/14/2023)   [facility] Utilities   . Threatened with loss of utilities: Yes      Attestation Statement:   I personally performed the service, non-incident to. (WP)   WHITNEY MEELER, NP    BENTON DOWSE, NP  This note was generated in part with voice recognition software and I apologize for any typographical errors that were not detected and corrected.  PCP: Dr. Chyrl Costa

## 2023-11-06 ENCOUNTER — Encounter: Payer: Self-pay | Admitting: Gastroenterology

## 2023-11-06 ENCOUNTER — Ambulatory Visit: Payer: Self-pay | Admitting: Anesthesiology

## 2023-11-06 ENCOUNTER — Encounter
Admission: RE | Disposition: A | Discharge status: Home / Self Care | Payer: Self-pay | Source: Home / Self Care | Attending: Gastroenterology

## 2023-11-06 ENCOUNTER — Other Ambulatory Visit: Payer: Self-pay

## 2023-11-06 ENCOUNTER — Ambulatory Visit
Admission: RE | Admit: 2023-11-06 | Discharge: 2023-11-06 | Disposition: A | Discharge status: Home / Self Care | Attending: Gastroenterology | Admitting: Gastroenterology

## 2023-11-06 DIAGNOSIS — I2510 Atherosclerotic heart disease of native coronary artery without angina pectoris: Secondary | ICD-10-CM | POA: Insufficient documentation

## 2023-11-06 DIAGNOSIS — I10 Essential (primary) hypertension: Secondary | ICD-10-CM | POA: Diagnosis not present

## 2023-11-06 DIAGNOSIS — Z87891 Personal history of nicotine dependence: Secondary | ICD-10-CM | POA: Insufficient documentation

## 2023-11-06 DIAGNOSIS — G709 Myoneural disorder, unspecified: Secondary | ICD-10-CM | POA: Insufficient documentation

## 2023-11-06 DIAGNOSIS — K635 Polyp of colon: Secondary | ICD-10-CM | POA: Insufficient documentation

## 2023-11-06 DIAGNOSIS — K5730 Diverticulosis of large intestine without perforation or abscess without bleeding: Secondary | ICD-10-CM | POA: Diagnosis not present

## 2023-11-06 DIAGNOSIS — Z1211 Encounter for screening for malignant neoplasm of colon: Secondary | ICD-10-CM | POA: Diagnosis not present

## 2023-11-06 DIAGNOSIS — K2970 Gastritis, unspecified, without bleeding: Secondary | ICD-10-CM | POA: Insufficient documentation

## 2023-11-06 DIAGNOSIS — G4733 Obstructive sleep apnea (adult) (pediatric): Secondary | ICD-10-CM | POA: Insufficient documentation

## 2023-11-06 DIAGNOSIS — E66813 Obesity, class 3: Secondary | ICD-10-CM | POA: Diagnosis not present

## 2023-11-06 DIAGNOSIS — R1310 Dysphagia, unspecified: Secondary | ICD-10-CM | POA: Diagnosis present

## 2023-11-06 DIAGNOSIS — F418 Other specified anxiety disorders: Secondary | ICD-10-CM | POA: Insufficient documentation

## 2023-11-06 DIAGNOSIS — Z7984 Long term (current) use of oral hypoglycemic drugs: Secondary | ICD-10-CM | POA: Diagnosis not present

## 2023-11-06 DIAGNOSIS — D125 Benign neoplasm of sigmoid colon: Secondary | ICD-10-CM | POA: Diagnosis not present

## 2023-11-06 DIAGNOSIS — K641 Second degree hemorrhoids: Secondary | ICD-10-CM | POA: Diagnosis not present

## 2023-11-06 DIAGNOSIS — K621 Rectal polyp: Secondary | ICD-10-CM | POA: Diagnosis not present

## 2023-11-06 DIAGNOSIS — Z6841 Body Mass Index (BMI) 40.0 and over, adult: Secondary | ICD-10-CM | POA: Diagnosis not present

## 2023-11-06 DIAGNOSIS — K644 Residual hemorrhoidal skin tags: Secondary | ICD-10-CM | POA: Diagnosis not present

## 2023-11-06 DIAGNOSIS — E039 Hypothyroidism, unspecified: Secondary | ICD-10-CM | POA: Insufficient documentation

## 2023-11-06 DIAGNOSIS — Z860100 Personal history of colon polyps, unspecified: Secondary | ICD-10-CM | POA: Diagnosis present

## 2023-11-06 DIAGNOSIS — D123 Benign neoplasm of transverse colon: Secondary | ICD-10-CM | POA: Diagnosis not present

## 2023-11-06 DIAGNOSIS — E1151 Type 2 diabetes mellitus with diabetic peripheral angiopathy without gangrene: Secondary | ICD-10-CM | POA: Diagnosis not present

## 2023-11-06 HISTORY — PX: COLONOSCOPY: SHX5424

## 2023-11-06 HISTORY — PX: ESOPHAGOGASTRODUODENOSCOPY: SHX5428

## 2023-11-06 HISTORY — PX: MALONEY DILATION: SHX5535

## 2023-11-06 HISTORY — PX: POLYPECTOMY: SHX149

## 2023-11-06 LAB — GLUCOSE, CAPILLARY: Glucose-Capillary: 178 mg/dL — ABNORMAL HIGH (ref 70–99)

## 2023-11-06 SURGERY — COLONOSCOPY
Anesthesia: General

## 2023-11-06 MED ORDER — SODIUM CHLORIDE 0.9 % IV SOLN: INTRAVENOUS | Status: DC

## 2023-11-06 MED ORDER — PROPOFOL 1000 MG/100ML IV EMUL
INTRAVENOUS | Status: AC
  Filled 2023-11-06: qty 100

## 2023-11-06 MED ORDER — FENTANYL CITRATE (PF) 100 MCG/2ML IJ SOLN
INTRAMUSCULAR | Status: DC | PRN
Start: 2023-11-06 — End: 2023-11-06
  Administered 2023-11-06: 50 ug via INTRAVENOUS

## 2023-11-06 MED ORDER — EPHEDRINE 5 MG/ML INJ
INTRAVENOUS | Status: AC
  Filled 2023-11-06: qty 5

## 2023-11-06 MED ORDER — LIDOCAINE HCL (CARDIAC) PF 100 MG/5ML IV SOSY
PREFILLED_SYRINGE | INTRAVENOUS | Status: DC | PRN
  Administered 2023-11-06: 100 mg via INTRAVENOUS

## 2023-11-06 MED ORDER — PHENYLEPHRINE 80 MCG/ML (10ML) SYRINGE FOR IV PUSH (FOR BLOOD PRESSURE SUPPORT)
PREFILLED_SYRINGE | INTRAVENOUS | Status: AC
  Filled 2023-11-06: qty 10

## 2023-11-06 MED ORDER — FENTANYL CITRATE (PF) 100 MCG/2ML IJ SOLN
INTRAMUSCULAR | Status: AC
  Filled 2023-11-06: qty 2

## 2023-11-06 MED ORDER — EPHEDRINE SULFATE-NACL 50-0.9 MG/10ML-% IV SOSY
PREFILLED_SYRINGE | INTRAVENOUS | Status: DC | PRN
  Administered 2023-11-06: 5 mg via INTRAVENOUS

## 2023-11-06 MED ORDER — LIDOCAINE HCL (PF) 2 % IJ SOLN
INTRAMUSCULAR | Status: AC
  Filled 2023-11-06: qty 5

## 2023-11-06 MED ORDER — PHENYLEPHRINE 80 MCG/ML (10ML) SYRINGE FOR IV PUSH (FOR BLOOD PRESSURE SUPPORT)
PREFILLED_SYRINGE | INTRAVENOUS | Status: DC | PRN
Start: 2023-11-06 — End: 2023-11-06
  Administered 2023-11-06: 80 ug via INTRAVENOUS

## 2023-11-06 MED ORDER — PROPOFOL 10 MG/ML IV BOLUS
INTRAVENOUS | Status: DC | PRN
  Administered 2023-11-06: 50 ug via INTRAVENOUS
  Administered 2023-11-06: 50 mg via INTRAVENOUS
  Administered 2023-11-06: 125 ug/kg/min via INTRAVENOUS

## 2023-11-06 NOTE — Anesthesia Preprocedure Evaluation (Addendum)
 Anesthesia Evaluation  Patient identified by MRN, date of birth, ID band Patient awake    Reviewed: Allergy & Precautions, NPO status , Patient's Chart, lab work & pertinent test results  History of Anesthesia Complications Negative for: history of anesthetic complications  Airway Mallampati: IV  TM Distance: >3 FB Neck ROM: full    Dental  (+) Upper Dentures   Pulmonary sleep apnea , former smoker   Pulmonary exam normal        Cardiovascular hypertension, On Medications + CAD and + Peripheral Vascular Disease  Normal cardiovascular exam     Neuro/Psych  Headaches PSYCHIATRIC DISORDERS Anxiety Depression     Neuromuscular disease    GI/Hepatic negative GI ROS, Neg liver ROS,,,  Endo/Other  diabetes, Type 2Hypothyroidism  Class 3 obesity  Renal/GU negative Renal ROS  negative genitourinary   Musculoskeletal   Abdominal   Peds  Hematology  (+) Blood dyscrasia, anemia   Anesthesia Other Findings Past Medical History: No date: Anemia No date: Anxiety     Comment:  a.) on BZO (clonazepam) PRN No date: Aortic atherosclerosis (HCC) No date: Arthritis No date: B12 deficiency No date: Bilateral carotid artery disease (HCC) No date: Chronic back pain No date: Coronary artery calcification seen on CT scan No date: Depression 09/01/2019: Diastolic dysfunction     Comment:  a.) TTE 09/01/2019: >55%; mild LA enlargement; triv PR,               mild MR/TR; G1DD. No date: Dyspnea No date: High cholesterol No date: Hypertension No date: Hypothyroidism No date: Long term current use of antithrombotics/antiplatelets     Comment:  a.) clopidogrel No date: Long term current use of immunosuppressive drug     Comment:  a.) on Ixekizumab (Taltz) for psoriatic athritis No date: Migraines 01/01/2021: Monoclonal gammopathy of unknown significance (MGUS)     Comment:  a.) IgG lambda MGUS No date: OSA (obstructive sleep  apnea)     Comment:  a.) does not require nocturnal PAP therapy No date: Pneumonia No date: Psoriatic arthritis (HCC)     Comment:  a.) on Ixekizumab (Taltz) 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 No date: T2DM (type 2 diabetes mellitus) (HCC)  Past Surgical History: No date: ABDOMINAL HYSTERECTOMY No date: BACK SURGERY     Comment:  2 level fusion 08/23/2013: BREAST BIOPSY; Left     Comment:  stereo, negative No date: BUNIONECTOMY; Bilateral No date: carpal tunnel; Right No date: COLONOSCOPY WITH ESOPHAGOGASTRODUODENOSCOPY (EGD) No date: DILATION AND CURETTAGE OF UTERUS 05/16/2021: KNEE ARTHROSCOPY; Left     Comment:  Procedure: ARTHROSCOPY KNEE;  Surgeon: Mardee Lynwood SQUIBB,               MD;  Location: [facility] ORS;  Service: Orthopedics;                Laterality: Left; 02/14/2022: SHOULDER ARTHROSCOPY WITH SUBACROMIAL DECOMPRESSION,  ROTATOR CUFF REPAIR AND BICEP TENDON REPAIR; Left     Comment:  Procedure: SHOULDER ARTHROSCOPY WITH DEBRIDEMENT,               DECOMPRESSION, ROTATOR CUFF REPAIR AND BICEPS TENODESIS.;              Surgeon: Edie Norleen PARAS, MD;  Location: [facility] ORS;                Service: Orthopedics;  Laterality: Left; No date: tendinitis; Right     Comment:  wrist No date: TRIGGER FINGER RELEASE; Right     Comment:  x 2 thumb and middle finger  BMI    Body Mass Index: 40.60 kg/m      Reproductive/Obstetrics negative OB ROS                              Anesthesia Physical Anesthesia Plan  ASA: 3  Anesthesia Plan: General   Post-op Pain Management: Minimal or no pain anticipated   Induction: Intravenous  PONV Risk Score and Plan: 2 and Propofol  infusion and TIVA  Airway Management Planned: Natural Airway and Nasal Cannula  Additional Equipment:   Intra-op Plan:   Post-operative Plan:   Informed Consent: I have reviewed the patients History and Physical, chart, labs and discussed the procedure including the risks, benefits and  alternatives for the proposed anesthesia with the patient or authorized representative who has indicated his/her understanding and acceptance.     Dental Advisory Given  Plan Discussed with: Anesthesiologist, CRNA and Surgeon  Anesthesia Plan Comments: (Patient consented for risks of anesthesia including but not limited to:  - adverse reactions to medications - risk of airway placement if required - damage to eyes, teeth, lips or other oral mucosa - nerve damage due to positioning  - sore throat or hoarseness - Damage to heart, brain, nerves, lungs, other parts of body or loss of life  Patient voiced understanding and assent.)        Anesthesia Quick Evaluation

## 2023-11-06 NOTE — Interval H&P Note (Signed)
 History and Physical Interval Note: Preprocedure H&P from 11/06/23  was reviewed and there was no interval change after seeing and examining the patient.  Written consent was obtained from the patient after discussion of risks, benefits, and alternatives. Patient has consented to proceed with Esophagogastroduodenoscopy and Colonoscopy with possible intervention   11/06/2023 7:40 AM  [name]  has presented today for surgery, with the diagnosis of HX OF ADENOMATOUS POLYP OF COLON.  The various methods of treatment have been discussed with the patient and family. After consideration of risks, benefits and other options for treatment, the patient has consented to  Procedure(s): COLONOSCOPY (N/A) EGD (ESOPHAGOGASTRODUODENOSCOPY) (N/A) as a surgical intervention.  The patient's history has been reviewed, patient examined, no change in status, stable for surgery.  I have reviewed the patient's chart and labs.  Questions were answered to the patient's satisfaction.     [name]

## 2023-11-06 NOTE — H&P (Signed)
 Pre-Procedure H&P   Patient ID: Taylor [name] is a 63 y.o. female.  Gastroenterology Provider: [name], DO  Referring Provider: Delmar Gails, NP PCP: Auston Reyes BIRCH, MD  Date: 11/06/2023  HPI Taylor [name] is a 63 y.o. female who presents today for Esophagogastroduodenoscopy and Colonoscopy for Personal history of colon polyps, dysphagia .  Intermittent difficulty with swallowing solids and liquids.  She points to her upper neck.  Denies any odynophagia.  Appetite and weight are stable.  Underwent modified [name] study which was normal.  [name] in 2016 only demonstrated acid reflux  Chronic diarrhea but previous random biopsies were negative.  No melena or hematochezia  Last EGD and colonoscopy in June 2020 duodenitis present.  Negative for H. pylori.  Negative for Barrett's esophagus and celiac.  Diverticulosis present.  Negative for microscopic colitis biopsies.  Otherwise both exams were normal  Colonoscopy 2015 1 adenomatous polyp internal hemorrhoids and diverticulosis  No family history of colon cancer or colon polyps to her knowledge   Past Medical History:  Diagnosis Date   Anemia    Anxiety    a.) on BZO (clonazepam) PRN   Aortic atherosclerosis (HCC)    Arthritis    B12 deficiency    Bilateral carotid artery disease (HCC)    Chronic back pain    Coronary artery calcification seen on CT scan    Depression    Diastolic dysfunction 09/01/2019   a.) TTE 09/01/2019: >55%; mild LA enlargement; triv PR, mild MR/TR; G1DD.   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   OSA (obstructive sleep apnea)    a.) does not require  nocturnal PAP therapy   Pneumonia    Psoriatic arthritis (HCC)    a.) on Ixekizumab (Taltz)   Sensory polyneuropathy    T2DM (type 2 diabetes mellitus) (HCC)     Past Surgical History:  Procedure Laterality Date   ABDOMINAL HYSTERECTOMY     BACK SURGERY     2 level fusion   BREAST BIOPSY Left 08/23/2013   stereo, negative   BUNIONECTOMY Bilateral    carpal tunnel Right    COLONOSCOPY WITH ESOPHAGOGASTRODUODENOSCOPY (EGD)     DILATION AND CURETTAGE OF UTERUS     KNEE ARTHROSCOPY Left 05/16/2021   Procedure: ARTHROSCOPY KNEE;  Surgeon: Mardee Lynwood SQUIBB, MD;  Location: [facility];  Service: Orthopedics;  Laterality: Left;   SHOULDER ARTHROSCOPY WITH SUBACROMIAL DECOMPRESSION, ROTATOR CUFF REPAIR AND BICEP TENDON REPAIR Left 02/14/2022   Procedure: SHOULDER ARTHROSCOPY WITH DEBRIDEMENT, DECOMPRESSION, ROTATOR CUFF REPAIR AND BICEPS TENODESIS.;  Surgeon: Edie Norleen PARAS, MD;  Location: [facility];  Service: Orthopedics;  Laterality: Left;   tendinitis Right    wrist   TRIGGER FINGER RELEASE Right    x 2 thumb and middle finger    Family History No h/o GI disease or malignancy  Review of Systems  Constitutional:  Negative for activity change, appetite change, chills, diaphoresis, fatigue, fever and unexpected weight change.  HENT:  Positive for trouble swallowing. Negative for voice change.   Respiratory:  Negative for shortness of breath and wheezing.   Cardiovascular:  Negative for  chest pain, palpitations and leg swelling.  Gastrointestinal:  Positive for diarrhea. Negative for abdominal distention, abdominal pain, anal bleeding, blood in stool, constipation, nausea, rectal pain and vomiting.  Musculoskeletal:  Negative for arthralgias and myalgias.  Skin:  Negative for color change and pallor.  Neurological:  Negative for dizziness, syncope and weakness.  Psychiatric/Behavioral:  Negative for confusion.   All other systems reviewed and are negative.    Medications No current  facility-administered medications on file prior to encounter.   Current Outpatient Medications on File Prior to Encounter  Medication Sig Dispense Refill   bisoprolol-hydrochlorothiazide (ZIAC) 10-6.25 MG tablet Take 1 tablet by mouth daily.     busPIRone (BUSPAR) 10 MG tablet Take 1 tablet by mouth 2 (two) times daily.     clonazePAM (KLONOPIN) 0.5 MG tablet Take 0.5 mg by mouth 2 (two) times daily as needed for anxiety.     clopidogrel (PLAVIX) 75 MG tablet Take 75 mg by mouth daily.     Ergocalciferol (VITAMIN D2 PO) Take 50,000 Units by mouth once a week.     escitalopram (LEXAPRO) 20 MG tablet Take 20 mg by mouth daily.     glimepiride (AMARYL) 4 MG tablet Take 4 mg by mouth daily.     levothyroxine (SYNTHROID) 50 MCG tablet Take 75 mcg by mouth daily before breakfast.     pregabalin (LYRICA) 50 MG capsule Take 50 mg by mouth 2 (two) times daily.     rosuvastatin (CRESTOR) 40 MG tablet Take 40 mg by mouth daily.     traMADol (ULTRAM) 50 MG tablet Take by mouth.     vitamin B-12 (CYANOCOBALAMIN) 1000 MCG tablet Take 1,000 mcg by mouth every 30 (thirty) days.      amitriptyline (ELAVIL) 10 MG tablet Take 10 mg by mouth at bedtime as needed for sleep. (Patient not taking: Reported on 03/18/2022)     oxyCODONE  (ROXICODONE ) 5 MG immediate release tablet Take 1-2 tablets (5-10 mg total) by mouth every 4 (four) hours as needed for moderate pain or severe pain. (Patient not taking: Reported on 04/11/2023) 40 tablet 0    Pertinent medications related to GI and procedure were reviewed by me with the patient prior to the procedure   Current Facility-Administered Medications:    0.9 %  sodium chloride  infusion, , Intravenous, Continuous, Onita Elspeth Sharper, DO, Last Rate: 20 mL/hr at 11/06/23 0649, New Bag at 11/06/23 0649  sodium chloride  20 mL/hr at 11/06/23 9350       Allergies  Allergen Reactions   Aspirin Hives and Swelling   Penicillin G Hives   Penicillins Hives   Allergies were  reviewed by me prior to the procedure  Objective   Body mass index is 40.6 kg/m. Vitals:   11/06/23 0643  BP: (!) 153/84  Pulse: 89  Temp: (!) 97.4 F (36.3 C)  TempSrc: Temporal  SpO2: 97%  Weight: 110.7 kg  Height: 5' 5 (1.651 m)     Physical Exam Vitals and nursing note reviewed.  Constitutional:      General: She is not in acute distress.    Appearance: Normal appearance. She is obese. She is not ill-appearing, toxic-appearing or diaphoretic.  HENT:     Head: Normocephalic and atraumatic.     Nose: Nose normal.     Mouth/Throat:     Mouth: Mucous membranes are moist.     Pharynx: Oropharynx is clear.  Eyes:     General: No scleral icterus.    Extraocular Movements:  Extraocular movements intact.  Cardiovascular:     Rate and Rhythm: Normal rate and regular rhythm.     Heart sounds: Normal heart sounds. No murmur heard.    No friction rub. No gallop.  Pulmonary:     Effort: Pulmonary effort is normal. No respiratory distress.     Breath sounds: Normal breath sounds. No wheezing, rhonchi or rales.  Abdominal:     General: Bowel sounds are normal. There is no distension.     Palpations: Abdomen is soft.     Tenderness: There is no abdominal tenderness. There is no guarding or rebound.  Musculoskeletal:     Cervical back: Neck supple.     Right lower leg: No edema.     Left lower leg: No edema.  Skin:    General: Skin is warm and dry.     Coloration: Skin is not jaundiced or pale.  Neurological:     General: No focal deficit present.     Mental Status: She is alert and oriented to person, place, and time. Mental status is at baseline.  Psychiatric:        Mood and Affect: Mood normal.        Behavior: Behavior normal.        Thought Content: Thought content normal.        Judgment: Judgment normal.      Assessment:  Taylor [name] is a 63 y.o. female  who presents today for Esophagogastroduodenoscopy and Colonoscopy for Personal history of colon  polyps, dysphagia .  Plan:  Esophagogastroduodenoscopy and Colonoscopy with possible intervention today  Esophagogastroduodenoscopy and Colonoscopy with possible biopsy, control of bleeding, polypectomy, and interventions as necessary has been discussed with the patient/patient representative. Informed consent was obtained from the patient/patient representative after explaining the indication, nature, and risks of the procedure including but not limited to death, bleeding, perforation, missed neoplasm/lesions, cardiorespiratory compromise, and reaction to medications. Opportunity for questions was given and appropriate answers were provided. Patient/patient representative has verbalized understanding is amenable to undergoing the procedure.   [name], DO  [facility] Gastroenter[facility]  Portions of the record may have been created with voice recognition software. Occasional wrong-word or 'sound-a-like' substitutions may have occurred due to the inherent limitations of voice recognition software.  Read the chart carefully and recognize, using context, where substitutions may have occurred.

## 2023-11-06 NOTE — Op Note (Addendum)
 [facility] Gastroenterology Patient Name: Taylor [name] Procedure Date: 11/06/2023 7:12 AM MRN: [id number] Account #: [id number] Date of Birth: [birth year] Admit Type: Outpatient Age: 63 Room: [facility] ENDO ROOM 1 Gender: Female Note Status: Finalized Instrument Name: Barnie GI Scope [id number] Procedure:             Upper GI endoscopy Indications:           Dysphagia Providers:             Elspeth Ozell Onita ROSALEA, DO Referring MD:          Reyes BIRCH. Auston, MD (Referring MD) Medicines:             Monitored Anesthesia Care Complications:         No immediate complications. Estimated blood loss:                         Minimal. Procedure:             Pre-Anesthesia Assessment:                        - Prior to the procedure, a History and Physical was                         performed, and patient medications and allergies were                         reviewed. The patient is competent. The risks and                         benefits of the procedure and the sedation options and                         risks were discussed with the patient. All questions                         were answered and informed consent was obtained.                         Patient identification and proposed procedure were                         verified by the physician, the nurse, the anesthetist                         and the technician in the endoscopy suite. Mental                         Status Examination: alert and oriented. Airway                         Examination: normal oropharyngeal airway and neck                         mobility. Respiratory Examination: clear to                         auscultation. CV Examination: RRR, no murmurs, no S3  or S4. Prophylactic Antibiotics: The patient does not                         require prophylactic antibiotics. Prior                         Anticoagulants: The patient has taken Plavix                          (clopidogrel), last dose was 7 days prior to                         procedure. ASA Grade Assessment: III - A patient with                         severe systemic disease. After reviewing the risks and                         benefits, the patient was deemed in satisfactory                         condition to undergo the procedure. The anesthesia                         plan was to use monitored anesthesia care (MAC).                         Immediately prior to administration of medications,                         the patient was re-assessed for adequacy to receive                         sedatives. The heart rate, respiratory rate, oxygen                         saturations, blood pressure, adequacy of pulmonary                         ventilation, and response to care were monitored                         throughout the procedure. The physical status of the                         patient was re-assessed after the procedure.                        After obtaining informed consent, the endoscope was                         passed under direct vision. Throughout the procedure,                         the patient's blood pressure, pulse, and oxygen                         saturations were monitored continuously. The Endoscope  was introduced through the mouth, and advanced to the                         second part of duodenum. The upper GI endoscopy was                         accomplished without difficulty. The patient tolerated                         the procedure well. Findings:      The duodenal bulb, first portion of the duodenum and second portion of       the duodenum were normal. Estimated blood loss: none.      Localized moderate inflammation characterized by erosions, erythema and       linear erosions was found in the gastric antrum. Biopsies were taken       with a cold forceps for Helicobacter pylori testing. Estimated blood       loss was  minimal.      The exam of the stomach was otherwise normal.      No endoscopic abnormality was evident in the esophagus to explain the       patient's complaint of dysphagia. It was decided, however, to proceed       with dilation of the entire esophagus. The scope was withdrawn. Dilation       was performed with a Maloney dilator with no resistance at 52 Fr. The       dilation site was examined following endoscope reinsertion and showed       mild mucosal disruption. Estimated blood loss was minimal.      The Z-line was regular. Estimated blood loss: none.      Esophagogastric landmarks were identified: the gastroesophageal junction       was found at 40 cm from the incisors.      The exam of the esophagus was otherwise normal. Impression:            - Normal duodenal bulb, first portion of the duodenum                         and second portion of the duodenum.                        - Gastritis. Biopsied.                        - No endoscopic esophageal abnormality to explain                         patient's dysphagia. Esophagus dilated. Dilated.                        - Z-line regular.                        - Esophagogastric landmarks identified. Recommendation:        - Patient has a contact number available for                         emergencies. The signs and symptoms of potential  delayed complications were discussed with the patient.                         Return to normal activities tomorrow. Written                         discharge instructions were provided to the patient.                        - Discharge patient to home.                        - Soft diet today.                        - Continue present medications.                        - Recommend proton pump inhibitor twice a day.                        - No ibuprofen, naproxen, or other non-steroidal                         anti-inflammatory drugs for 5 days.                        - see  colonoscopy report for plavix recommendations.                        - Await pathology results.                        - Repeat upper endoscopy PRN for retreatment.                        - Return to referring physician as previously                         scheduled. Procedure Code(s):     --- Professional ---                        [phone], Esophagogastroduodenoscopy, flexible,                         transoral; with biopsy, single or multiple                        [id number], Dilation of esophagus, by unguided sound or                         bougie, single or multiple passes Diagnosis Code(s):     --- Professional ---                        K29.70, Gastritis, unspecified, without bleeding                        R13.10, Dysphagia, unspecified CPT copyright 2022 American [employer] Association. All rights reserved. The codes documented in this report are preliminary and upon coder review may  be revised to meet current compliance  requirements. Attending Participation:      I personally performed the entire procedure. Elspeth Jungling, DO [name] DO, DO 11/06/2023 7:57:22 AM This report has been signed electronically. Number of Addenda: 0 Note Initiated On: 11/06/2023 7:12 AM Estimated Blood Loss:  Estimated blood loss was minimal.      [facility]

## 2023-11-06 NOTE — Transfer of Care (Signed)
 Immediate Anesthesia Transfer of Care Note  Patient: Taylor [name]  Procedure(s) Performed: COLONOSCOPY EGD (ESOPHAGOGASTRODUODENOSCOPY)  Patient Location: PACU  Anesthesia Type:MAC  Level of Consciousness: awake and alert   Airway & Oxygen Therapy: Patient Spontanous Breathing  Post-op Assessment: Report given to RN and Post -op Vital signs reviewed and stable  Post vital signs: stable  Last Vitals:  Vitals Value Taken Time  BP 118/60 11/06/23 08:25  Temp    Pulse 94 11/06/23 08:25  Resp 21 11/06/23 08:25  SpO2 97 % 11/06/23 08:25  Vitals shown include unfiled device data.  Last Pain:  Vitals:   11/06/23 0643  TempSrc: Temporal  PainSc: 0-No pain         Complications: No notable events documented.

## 2023-11-06 NOTE — Anesthesia Postprocedure Evaluation (Signed)
 Anesthesia Post Note  Patient: Westlyn B Mcglothen  Procedure(s) Performed: COLONOSCOPY EGD (ESOPHAGOGASTRODUODENOSCOPY)  Patient location during evaluation: Endoscopy Anesthesia Type: General Level of consciousness: awake and alert Pain management: pain level controlled Vital Signs Assessment: post-procedure vital signs reviewed and stable Respiratory status: spontaneous breathing, nonlabored ventilation, respiratory function stable and patient connected to nasal cannula oxygen Cardiovascular status: blood pressure returned to baseline and stable Postop Assessment: no apparent nausea or vomiting Anesthetic complications: no   No notable events documented.   Last Vitals:  Vitals:   11/06/23 0839 11/06/23 0844  BP: 115/68 118/71  Pulse: 86 82  Resp: 17 (!) 22  Temp:    SpO2: 98% 99%    Last Pain:  Vitals:   11/06/23 0844  TempSrc:   PainSc: 0-No pain                 Lendia LITTIE Mae

## 2023-11-06 NOTE — Op Note (Signed)
 [facility] Patient Name: Taylor [name] Procedure Date: 11/06/2023 7:09 AM MRN: [id number] Account #: [id number] Date of Birth: September 17, 1960 Admit Type: Outpatient Age: 63 Room: [facility] ENDO ROOM 1 Gender: Female Note Status: Finalized Instrument Name: Colon Scope [id number] Procedure:             Colonoscopy Indications:           High risk colon cancer surveillance: Personal history                         of colonic polyps Providers:             Elspeth Ozell Onita ROSALEA, DO Referring MD:          Reyes BIRCH. Auston, MD (Referring MD) Medicines:             Monitored Anesthesia Care Complications:         No immediate complications. Estimated blood loss:                         Minimal. Procedure:             Pre-Anesthesia Assessment:                        - Prior to the procedure, a History and Physical was                         performed, and patient medications and allergies were                         reviewed. The patient is competent. The risks and                         benefits of the procedure and the sedation options and                         risks were discussed with the patient. All questions                         were answered and informed consent was obtained.                         Patient identification and proposed procedure were                         verified by the physician, the nurse, the anesthetist                         and the technician in the endoscopy suite. Mental                         Status Examination: normal. Airway Examination: normal                         oropharyngeal airway and neck mobility. Respiratory                         Examination: clear to auscultation. CV Examination:  RRR, no murmurs, no S3 or S4. Prophylactic                         Antibiotics: The patient does not require prophylactic                         antibiotics. Prior Anticoagulants: The patient has                          taken Plavix (clopidogrel), last dose was 7 days prior                         to procedure. ASA Grade Assessment: III - A patient                         with severe systemic disease. After reviewing the                         risks and benefits, the patient was deemed in                         satisfactory condition to undergo the procedure. The                         anesthesia plan was to use monitored anesthesia care                         (MAC). Immediately prior to administration of                         medications, the patient was re-assessed for adequacy                         to receive sedatives. The heart rate, respiratory                         rate, oxygen saturations, blood pressure, adequacy of                         pulmonary ventilation, and response to care were                         monitored throughout the procedure. The physical                         status of the patient was re-assessed after the                         procedure.                        After obtaining informed consent, the colonoscope was                         passed under direct vision. Throughout the procedure,                         the patient's blood pressure, pulse, and oxygen  saturations were monitored continuously. The                         Colonoscope was introduced through the anus and                         advanced to the the terminal ileum, with                         identification of the appendiceal orifice and IC                         valve. The colonoscopy was performed without                         difficulty. The patient tolerated the procedure well.                         The quality of the bowel preparation was evaluated                         using the BBPS [facility] Bowel Preparation Scale) with                         scores of: Right Colon = 3, Transverse Colon = 3 and                         Left Colon = 3  (entire mucosa seen well with no                         residual staining, small fragments of stool or opaque                         liquid). The total BBPS score equals 9. The terminal                         ileum, ileocecal valve, appendiceal orifice, and                         rectum were photographed. Findings:      Skin tags were found on perianal exam.      The digital rectal exam was normal. Pertinent negatives include normal       sphincter tone.      The terminal ileum appeared normal. Estimated blood loss: none.      Non-bleeding internal hemorrhoids were found during retroflexion. The       hemorrhoids were Grade II (internal hemorrhoids that prolapse but reduce       spontaneously). Estimated blood loss: none.      Multiple small-mouthed diverticula were found in the sigmoid colon.       Estimated blood loss: none.      Five sessile polyps were found in the rectum (1), sigmoid colon (3) and       transverse colon (1). The polyps were 1 to 2 mm in size. These polyps       were removed with a jumbo cold forceps. Resection and retrieval were       complete. Estimated blood loss was minimal.  The exam was otherwise without abnormality on direct and retroflexion       views. Impression:            - Perianal skin tags found on perianal exam.                        - The examined portion of the ileum was normal.                        - Non-bleeding internal hemorrhoids.                        - Diverticulosis in the sigmoid colon.                        - Five 1 to 2 mm polyps in the rectum, in the sigmoid                         colon and in the transverse colon, removed with a                         jumbo cold forceps. Resected and retrieved.                        - The examination was otherwise normal on direct and                         retroflexion views. Recommendation:        - Patient has a contact number available for                         emergencies. The  signs and symptoms of potential                         delayed complications were discussed with the patient.                         Return to normal activities tomorrow. Written                         discharge instructions were provided to the patient.                        - Discharge patient to home.                        - Resume previous diet.                        - Continue present medications.                        - Await pathology results.                        - Repeat colonoscopy for surveillance based on                         pathology results.                        -  No ibuprofen, naproxen, or other non-steroidal                         anti-inflammatory drugs for 5 days after polyp removal.                        - Resume Plavix (clopidogrel) at prior dose in 2 days.                         Refer to managing physician for further adjustment of                         therapy.                        - Return to referring physician as previously                         scheduled.                        - The findings and recommendations were discussed with                         the patient. Procedure Code(s):     --- Professional ---                        [phone], Colonoscopy, flexible; with biopsy, single or                         multiple Diagnosis Code(s):     --- Professional ---                        Z86.010, Personal history of colonic polyps                        K64.1, Second degree hemorrhoids                        D12.8, Benign neoplasm of rectum                        D12.5, Benign neoplasm of sigmoid colon                        D12.3, Benign neoplasm of transverse colon (hepatic                         flexure or splenic flexure)                        K64.4, Residual hemorrhoidal skin tags                        K57.30, Diverticulosis of large intestine without                         perforation or abscess without bleeding CPT copyright  2022 American Medical Association. All rights reserved. The codes documented in this report are preliminary and upon coder review may  be revised to meet current compliance  requirements. Attending Participation:      I personally performed the entire procedure. Elspeth Jungling, DO [name] DO, DO 11/06/2023 8:25:34 AM This report has been signed electronically. Number of Addenda: 0 Note Initiated On: 11/06/2023 7:09 AM Scope Withdrawal Time: 0 hours 13 minutes 49 seconds  Total Procedure Duration: 0 hours 18 minutes 49 seconds  Estimated Blood Loss:  Estimated blood loss was minimal.      [facility]

## 2023-11-07 LAB — SURGICAL PATHOLOGY

## 2023-11-17 ENCOUNTER — Telehealth: Payer: Self-pay | Admitting: Neurosurgery

## 2023-11-17 NOTE — Telephone Encounter (Signed)
I left the patient a detailed message.

## 2023-11-17 NOTE — Telephone Encounter (Signed)
 Patient called to find out if her back injection she has scheduled for 01/23/2024 would interfere with her surgery on 02/09/2024. Please advise.

## 2023-11-24 ENCOUNTER — Ambulatory Visit
Admission: RE | Admit: 2023-11-24 | Discharge: 2023-11-24 | Disposition: A | Discharge status: Home / Self Care | Source: Ambulatory Visit | Attending: Internal Medicine | Admitting: Internal Medicine

## 2023-11-24 DIAGNOSIS — Z1231 Encounter for screening mammogram for malignant neoplasm of breast: Secondary | ICD-10-CM | POA: Insufficient documentation

## 2023-11-27 NOTE — Discharge Instructions (Signed)

## 2023-12-03 ENCOUNTER — Encounter
Admission: RE | Admit: 2023-12-03 | Discharge: 2023-12-03 | Disposition: A | Discharge status: Home / Self Care | Source: Ambulatory Visit | Attending: Orthopedic Surgery | Admitting: Orthopedic Surgery

## 2023-12-03 ENCOUNTER — Other Ambulatory Visit: Payer: Self-pay

## 2023-12-03 VITALS — BP 142/76 | HR 77 | Resp 14 | Ht 65.0 in | Wt 250.6 lb

## 2023-12-03 DIAGNOSIS — Z880 Allergy status to penicillin: Secondary | ICD-10-CM | POA: Insufficient documentation

## 2023-12-03 DIAGNOSIS — M1712 Unilateral primary osteoarthritis, left knee: Secondary | ICD-10-CM | POA: Insufficient documentation

## 2023-12-03 DIAGNOSIS — E1165 Type 2 diabetes mellitus with hyperglycemia: Secondary | ICD-10-CM | POA: Diagnosis not present

## 2023-12-03 DIAGNOSIS — E118 Type 2 diabetes mellitus with unspecified complications: Secondary | ICD-10-CM | POA: Insufficient documentation

## 2023-12-03 DIAGNOSIS — R739 Hyperglycemia, unspecified: Secondary | ICD-10-CM

## 2023-12-03 DIAGNOSIS — Z01812 Encounter for preprocedural laboratory examination: Secondary | ICD-10-CM | POA: Insufficient documentation

## 2023-12-03 DIAGNOSIS — Z01818 Encounter for other preprocedural examination: Secondary | ICD-10-CM

## 2023-12-03 HISTORY — DX: Other intervertebral disc degeneration, lumbar region without mention of lumbar back pain or lower extremity pain: M51.369

## 2023-12-03 HISTORY — DX: Supraventricular tachycardia, unspecified: I47.10

## 2023-12-03 HISTORY — DX: Chronic obstructive pulmonary disease, unspecified: J44.9

## 2023-12-03 HISTORY — DX: Morbid (severe) obesity due to excess calories: E66.01

## 2023-12-03 HISTORY — DX: Gastro-esophageal reflux disease without esophagitis: K21.9

## 2023-12-03 HISTORY — DX: Unilateral primary osteoarthritis, left knee: M17.12

## 2023-12-03 LAB — SURGICAL PCR SCREEN
MRSA, PCR: NEGATIVE
Staphylococcus aureus: NEGATIVE

## 2023-12-03 LAB — URINALYSIS, ROUTINE W REFLEX MICROSCOPIC
Bilirubin Urine: NEGATIVE
Glucose, UA: NEGATIVE mg/dL
Hgb urine dipstick: NEGATIVE
Ketones, ur: NEGATIVE mg/dL
Leukocytes,Ua: NEGATIVE
Nitrite: NEGATIVE
Protein, ur: NEGATIVE mg/dL
Specific Gravity, Urine: 1.012 (ref 1.005–1.030)
pH: 5 (ref 5.0–8.0)

## 2023-12-03 LAB — COMPREHENSIVE METABOLIC PANEL WITH GFR
ALT: 32 U/L (ref 0–44)
AST: 30 U/L (ref 15–41)
Albumin: 4.2 g/dL (ref 3.5–5.0)
Alkaline Phosphatase: 68 U/L (ref 38–126)
Anion gap: 11 (ref 5–15)
BUN: 14 mg/dL (ref 8–23)
CO2: 26 mmol/L (ref 22–32)
Calcium: 9.2 mg/dL (ref 8.9–10.3)
Chloride: 103 mmol/L (ref 98–111)
Creatinine, Ser: 0.65 mg/dL (ref 0.44–1.00)
GFR, Estimated: >60 mL/min (ref >60)
Glucose, Bld: 146 mg/dL — ABNORMAL HIGH (ref 70–99)
Potassium: 3.9 mmol/L (ref 3.5–5.1)
Sodium: 140 mmol/L (ref 135–145)
Total Bilirubin: 0.7 mg/dL (ref 0.0–1.2)
Total Protein: 7.6 g/dL (ref 6.5–8.1)

## 2023-12-03 LAB — HEMOGLOBIN A1C
Hgb A1c MFr Bld: 7 % — ABNORMAL HIGH (ref 4.8–5.6)
Mean Plasma Glucose: 154.2 mg/dL

## 2023-12-03 LAB — SEDIMENTATION RATE: Sed Rate: 38 mm/h — ABNORMAL HIGH (ref 0–30)

## 2023-12-03 LAB — C-REACTIVE PROTEIN: CRP: 1.1 mg/dL — ABNORMAL HIGH (ref <1.0)

## 2023-12-03 NOTE — Patient Instructions (Addendum)
 Your procedure is scheduled on:12-10-23 Wednesday Report to the Registration Desk on the 1st floor of the [facility].Then proceed to the 2nd floor Surgery Desk To find out your arrival time, please call [phone] between 1PM - 3PM on:12-09-23 Tuesday If your arrival time is 6:00 am, do not arrive before that time as the [facility] entrance doors do not open until 6:00 am.  REMEMBER: Instructions that are not followed completely may result in serious medical risk, up to and including death; or upon the discretion of your surgeon and anesthesiologist your surgery may need to be rescheduled.  Do not eat food after midnight the night before surgery.  No gum chewing or hard candies.  You may however, drink Water up to 2 hours before you are scheduled to arrive for your surgery. Do not drink anything within 2 hours of your scheduled arrival time.  In addition, your doctor has ordered for you to drink the provided:  Gatorade G2 Drinking this carbohydrate drink up to two hours before surgery helps to reduce insulin  resistance and improve patient outcomes. Please complete drinking 2 hours before scheduled arrival time.  One week prior to surgery:Stop NOW (12-03-23) Stop Anti-inflammatories (NSAIDS) such as Advil, Aleve, Ibuprofen, Motrin, Naproxen, Naprosyn and Aspirin based products such as Excedrin, Goody's Powder, BC Powder. Stop ANY OVER THE COUNTER supplements until after surgery (Vitamin B12)  You may however, continue to take Tylenol  if needed for pain up until the day of surgery.  Stop clopidogrel (PLAVIX) 7 days prior to surgery-Last dose was on 12-02-23 Tuesday  Continue taking all of your other prescription medications up until the day of surgery.  ON THE DAY OF SURGERY ONLY TAKE THESE MEDICATIONS WITH SIPS OF WATER: -busPIRone (BUSPAR)  -levothyroxine (SYNTHROID)  -pregabalin (LYRICA)  -You may take clonazePAM (KLONOPIN) if needed for anxiety  Use your Advair Inhaler the day  of surgery and bring your Albuterol  Inhaler to the hospital  No Alcohol for 24 hours before or after surgery.  No Smoking including e-cigarettes for 24 hours before surgery.  No chewable tobacco products for at least 6 hours before surgery.  No nicotine patches on the day of surgery.  Do not use any recreational drugs for at least a week (preferably 2 weeks) before your surgery.  Please be advised that the combination of cocaine and anesthesia may have negative outcomes, up to and including death. If you test positive for cocaine, your surgery will be cancelled.  On the morning of surgery brush your teeth with toothpaste and water, you may rinse your mouth with mouthwash if you wish. Do not swallow any toothpaste or mouthwash.  Use CHG Soap as directed on instruction sheet.  Do not wear jewelry, make-up, hairpins, clips or nail polish.  For welded (permanent) jewelry: bracelets, anklets, waist bands, etc.  Please have this removed prior to surgery.  If it is not removed, there is a chance that hospital personnel will need to cut it off on the day of surgery.  Do not wear lotions, powders, or perfumes.   Do not shave body hair from the neck down 48 hours before surgery.  Contact lenses, hearing aids and dentures may not be worn into surgery.  Do not bring valuables to the hospital. [facility] is not responsible for any missing/lost belongings or valuables.   Notify your doctor if there is any change in your medical condition (cold, fever, infection).  Wear comfortable clothing (specific to your surgery type) to the hospital.  After surgery, you can help prevent lung complications by doing breathing exercises.  Take deep breaths and cough every 1-2 hours. Your doctor may order a device called an Incentive Spirometer to help you take deep breaths. When coughing or sneezing, hold a pillow firmly against your incision with both hands. This is called "splinting." Doing this helps  protect your incision. It also decreases belly discomfort.  If you are being admitted to the hospital overnight, leave your suitcase in the car. After surgery it may be brought to your room.  In case of increased patient census, it may be necessary for you, the patient, to continue your postoperative care in the Same Day Surgery department.  If you are being discharged the day of surgery, you will not be allowed to drive home. You will need a responsible individual to drive you home and stay with you for 24 hours after surgery.   If you are taking public transportation, you will need to have a responsible individual with you.  Please call the Pre-admissions Testing Dept. at [phone] if you have any questions about these instructions.  Surgery Visitation Policy:  Patients having surgery or a procedure may have two visitors.  Children under the age of 74 must have an adult with them who is not the patient.  Inpatient Visitation:    Visiting hours are 7 a.m. to 8 p.m. Up to four visitors are allowed at one time in a patient room. The visitors may rotate out with other people during the day.  One visitor age 29 or older may stay with the patient overnight and must be in the room by 8 p.m.    Pre-operative 5 CHG Bath Instructions   You can play a key role in reducing the risk of infection after surgery. Your skin needs to be as free of germs as possible. You can reduce the number of germs on your skin by washing with CHG (chlorhexidine  gluconate) soap before surgery. CHG is an antiseptic soap that kills germs and continues to kill germs even after washing.   DO NOT use if you have an allergy to chlorhexidine /CHG or antibacterial soaps. If your skin becomes reddened or irritated, stop using the CHG and notify one of our RNs at [phone].   Please shower with the CHG soap starting 4 days before surgery using the following schedule:     Please keep in mind the following:  DO NOT  shave, including legs and underarms, starting the day of your first shower.   You may shave your face at any point before/day of surgery.  Place clean sheets on your bed the day you start using CHG soap. Use a clean washcloth (not used since being washed) for each shower. DO NOT sleep with pets once you start using the CHG.   CHG Shower Instructions:  If you choose to wash your hair and private area, wash first with your normal shampoo/soap.  After you use shampoo/soap, rinse your hair and body thoroughly to remove shampoo/soap residue.  Turn the water OFF and apply about 3 tablespoons (45 ml) of CHG soap to a CLEAN washcloth.  Apply CHG soap ONLY FROM YOUR NECK DOWN TO YOUR TOES (washing for 3-5 minutes)  DO NOT use CHG soap on face, private areas, open wounds, or sores.  Pay special attention to the area where your surgery is being performed.  If you are having back surgery, having someone wash your back for you may be helpful. Wait 2 minutes  after CHG soap is applied, then you may rinse off the CHG soap.  Pat dry with a clean towel  Put on clean clothes/pajamas   If you choose to wear lotion, please use ONLY the CHG-compatible lotions on the back of this paper.     Additional instructions for the day of surgery: DO NOT APPLY any lotions, deodorants, cologne, or perfumes.   Put on clean/comfortable clothes.  Brush your teeth.  Ask your nurse before applying any prescription medications to the skin.      CHG Compatible Lotions   Aveeno Moisturizing lotion  Cetaphil Moisturizing Cream  Cetaphil Moisturizing Lotion  Clairol Herbal Essence Moisturizing Lotion, Dry Skin  Clairol Herbal Essence Moisturizing Lotion, Extra Dry Skin  Clairol Herbal Essence Moisturizing Lotion, Normal Skin  Curel Age Defying Therapeutic Moisturizing Lotion with Alpha Hydroxy  Curel Extreme Care Body Lotion  Curel Soothing Hands Moisturizing Hand Lotion  Curel Therapeutic Moisturizing Cream,  Fragrance-Free  Curel Therapeutic Moisturizing Lotion, Fragrance-Free  Curel Therapeutic Moisturizing Lotion, Original Formula  Eucerin Daily Replenishing Lotion  Eucerin Dry Skin Therapy Plus Alpha Hydroxy Crme  Eucerin Dry Skin Therapy Plus Alpha Hydroxy Lotion  Eucerin Original Crme  Eucerin Original Lotion  Eucerin Plus Crme Eucerin Plus Lotion  Eucerin TriLipid Replenishing Lotion  Keri Anti-Bacterial Hand Lotion  Keri Deep Conditioning Original Lotion Dry Skin Formula Softly Scented  Keri Deep Conditioning Original Lotion, Fragrance Free Sensitive Skin Formula  Keri Lotion Fast Absorbing Fragrance Free Sensitive Skin Formula  Keri Lotion Fast Absorbing Softly Scented Dry Skin Formula  Keri Original Lotion  Keri Skin Renewal Lotion Keri Silky Smooth Lotion  Keri Silky Smooth Sensitive Skin Lotion  Nivea Body Creamy Conditioning Oil  Nivea Body Extra Enriched Lotion  Nivea Body Original Lotion  Nivea Body Sheer Moisturizing Lotion Nivea Crme  Nivea Skin Firming Lotion  NutraDerm 30 Skin Lotion  NutraDerm Skin Lotion  NutraDerm Therapeutic Skin Cream  NutraDerm Therapeutic Skin Lotion  ProShield Protective Hand Cream  Provon moisturizing lotion  How to Use an Incentive Spirometer An incentive spirometer is a tool that measures how well you are filling your lungs with each breath. Learning to take long, deep breaths using this tool can help you keep your lungs clear and active. This may help to reverse or lessen your chance of developing breathing (pulmonary) problems, especially infection. You may be asked to use a spirometer: After a surgery. If you have a lung problem or a history of smoking. After a long period of time when you have been unable to move or be active. If the spirometer includes an indicator to show the highest number that you have reached, your health care provider or respiratory therapist will help you set a goal. Keep a log of your progress as told by  your health care provider. What are the risks? Breathing too quickly may cause dizziness or cause you to pass out. Take your time so you do not get dizzy or light-headed. If you are in pain, you may need to take pain medicine before doing incentive spirometry. It is harder to take a deep breath if you are having pain. How to use your incentive spirometer  Sit up on the edge of your bed or on a chair. Hold the incentive spirometer so that it is in an upright position. Before you use the spirometer, breathe out normally. Place the mouthpiece in your mouth. Make sure your lips are closed tightly around it. Breathe in slowly and as deeply as  you can through your mouth, causing the piston or the ball to rise toward the top of the chamber. Hold your breath for 3-5 seconds, or for as long as possible. If the spirometer includes a coach indicator, use this to guide you in breathing. Slow down your breathing if the indicator goes above the marked areas. Remove the mouthpiece from your mouth and breathe out normally. The piston or ball will return to the bottom of the chamber. Rest for a few seconds, then repeat the steps 10 or more times. Take your time and take a few normal breaths between deep breaths so that you do not get dizzy or light-headed. Do this every 1-2 hours when you are awake. If the spirometer includes a goal marker to show the highest number you have reached (best effort), use this as a goal to work toward during each repetition. After each set of 10 deep breaths, cough a few times. This will help to make sure that your lungs are clear. If you have an incision on your chest or abdomen from surgery, place a pillow or a rolled-up towel firmly against the incision when you cough. This can help to reduce pain while taking deep breaths and coughing. General tips When you are able to get out of bed: Walk around often. Continue to take deep breaths and cough in order to clear your  lungs. Keep using the incentive spirometer until your health care provider says it is okay to stop using it. If you have been in the hospital, you may be told to keep using the spirometer at home. Contact a health care provider if: You are having difficulty using the spirometer. You have trouble using the spirometer as often as instructed. Your pain medicine is not giving enough relief for you to use the spirometer as told. You have a fever. Get help right away if: You develop shortness of breath. You develop a cough with bloody mucus from the lungs. You have fluid or blood coming from an incision site after you cough. Summary An incentive spirometer is a tool that can help you learn to take long, deep breaths to keep your lungs clear and active. You may be asked to use a spirometer after a surgery, if you have a lung problem or a history of smoking, or if you have been inactive for a long period of time. Use your incentive spirometer as instructed every 1-2 hours while you are awake. If you have an incision on your chest or abdomen, place a pillow or a rolled-up towel firmly against your incision when you cough. This will help to reduce pain. Get help right away if you have shortness of breath, you cough up bloody mucus, or blood comes from your incision when you cough. This information is not intended to replace advice given to you by your health care provider. Make sure you discuss any questions you have with your health care provider. Document Revised: 01/03/2023 Document Reviewed: 01/03/2023 Elsevier Patient Education  2024 [employer]  Preoperative Educational Videos for Total Hip, Knee and Shoulder Replacements  To better prepare for surgery, please view our videos that explain the physical activity and discharge planning required to have the best surgical recovery at [facility].  [email or web address]  Questions? Call [phone] or email [email or web address]       Community Resource Directory to address health-related social needs:  [email or web address]

## 2023-12-05 LAB — IGE: IgE (Immunoglobulin E), Serum: 168 [IU]/mL (ref 6–495)

## 2023-12-07 ENCOUNTER — Encounter: Payer: Self-pay | Admitting: Orthopedic Surgery

## 2023-12-08 ENCOUNTER — Encounter: Payer: Self-pay | Admitting: Orthopedic Surgery

## 2023-12-08 NOTE — Progress Notes (Signed)
 Perioperative / Anesthesia Services  Pre-Admission Testing Clinical Review / Pre-Operative Anesthesia Consult  Date: 12/08/23  PATIENT DEMOGRAPHICS: Name: Taylor [name] DOB: November 23, 1960 MRN:   [id number]  Note: Available PAT nursing documentation and vital signs have been reviewed. Clinical nursing staff has updated patient's PMH/PSHx, current medication list, and drug allergies/intolerances to ensure complete and comprehensive history available to assist care teams in MDM as it pertains to the aforementioned surgical procedure and anticipated anesthetic course. Extensive review of available clinical information personally performed. [place] [facility] and PSHx updated with any diagnoses/procedures that  may have been inadvertently omitted during her intake with the pre-admission testing department's nursing staff.  PLANNED SURGICAL PROCEDURE(S):   Case: [id number] Date/Time: 12/10/23 0815   Procedure: ARTHROPLASTY, KNEE, TOTAL, USING IMAGELESS COMPUTER-ASSISTED NAVIGATION (Left: Knee)   Anesthesia type: Choice   Diagnosis: Primary osteoarthritis of left knee [M17.12]   Pre-op diagnosis: Primary osteoarthritis of left knee M17.12   Location: [facility] OR ROOM 01 / [facility]   Surgeons: Mardee Lynwood SQUIBB, MD        CLINICAL DISCUSSION: Taylor [name] is a 63 y.o. female who is submitted for pre-surgical anesthesia review and clearance prior to her undergoing the above procedure. Patient is a Former Smoker (quit 03/2016). Pertinent PMH includes: CAD, diastolic dysfunction, PSVT, BILATERAL carotid artery disease, aortic atherosclerosis, HTN, HLD, T2DM, hypothyroidism, thyroid  nodules, dyspnea, COPD, OSAH (has not started nocturnal PAP therapy), GERD (no daily Tx), esophageal spasms, anemia, MGUS, OA, psoriatic arthritis, lumbar DDD (s/p L4-S1 fusion), anxiety (on BZO), depression.  Patient is followed by cardiology Juanice, MD). She was last seen in the cardiology clinic on ***;  notes reviewed. ***At the time of her clinic visit, patient doing well overall from a cardiovascular perspective. Patient denied any chest pain, shortness of breath, PND, orthopnea, palpitations, significant peripheral edema, weakness, fatigue, vertiginous symptoms, or presyncope/syncope. Patient with a past medical history significant for cardiovascular diagnoses. Documented physical exam was grossly benign, providing no evidence of acute exacerbation and/or decompensation of the patient's known cardiovascular conditions.  ***  Blood pressure***controlled at *** mmHg on currently prescribed *** therapies.  Patient is on *** for her HLD diagnosis and ASCVD prevention. ***Patient is not diabetic. ***She does not have an OSAH diagnosis. ***FC. No changes were made to her medication regimen during her visit with cardiology.  Patient scheduled to follow-up with outpatient cardiology in***months or sooner if needed.  Taylor [name] is scheduled for an elective ARTHROPLASTY, KNEE, TOTAL, USING IMAGELESS COMPUTER-ASSISTED NAVIGATION (Left: Knee) on 12/10/2023 with Dr. Lynwood SQUIBB Mardee, MD. Given patient's past medical history significant for cardiovascular diagnoses, presurgical cardiac clearance was sought by the PAT team. Per cardiology, this patient is optimized for surgery and may proceed with the planned procedural course with a ACCEPTABLE risk of significant perioperative cardiovascular complications.  In review of the patient's chart, it is noted that she is on daily oral antithrombotic therapy. She has been instructed on recommendations for holding her clopidogrel for 7 days prior to her procedure with plans to restart as soon as postoperative bleeding risk felt to be minimized by his primary attending surgeon. The patient has been instructed that her last dose should be on 12/02/2023.  Patient denies previous perioperative complications with anesthesia in the past. In review her EMR, it is noted that  patient underwent a general anesthetic course here at [facility] in 10/2023 without documented complications.   MOST RECENT VITAL SIGNS:  12/03/2023    8:31 AM 11/06/2023    8:44 AM 11/06/2023    8:39 AM  Vitals with BMI  Height 5' 5    Weight 250 lbs 10 oz    BMI 41.7    Systolic 142 118 884  Diastolic 76 71 68  Pulse 77 82 86   PROVIDERS/SPECIALISTS: NOTE: Primary physician provider listed below. Patient may have been seen by APP or partner within same practice.   PROVIDER ROLE / SPECIALTY LAST OV  Hooten, Lynwood SQUIBB, MD Orthopedics (Surgeon) 12/04/2023  Auston Reyes BIRCH, MD Primary Care Provider 10/24/2023  Dewane Shiner, DO Cardiology 11/28/2023  Tobie Solo, MD Rheumatology 11/11/2023  Avanell Katz, MD Physiatry 11/04/2023   ALLERGIES: Allergies  Allergen Reactions   Aspirin Hives and Swelling   Penicillins Hives    IgE = 168 (WNL) on 12/03/2023    CURRENT HOME MEDICATIONS: No current facility-administered medications for this encounter.    ADVAIR HFA 115-21 MCG/ACT inhaler   albuterol  (VENTOLIN  HFA) 108 (90 Base) MCG/ACT inhaler   bisoprolol-hydrochlorothiazide (ZIAC) 10-6.25 MG tablet   buPROPion (WELLBUTRIN XL) 150 MG 24 hr tablet   busPIRone (BUSPAR) 10 MG tablet   clonazePAM (KLONOPIN) 0.5 MG tablet   clopidogrel (PLAVIX) 75 MG tablet   cyanocobalamin (VITAMIN B12) 1000 MCG/ML injection   escitalopram (LEXAPRO) 20 MG tablet   glimepiride (AMARYL) 4 MG tablet   levothyroxine (SYNTHROID) 50 MCG tablet   naproxen sodium (ALEVE) 220 MG tablet   nortriptyline (PAMELOR) 25 MG capsule   pregabalin (LYRICA) 200 MG capsule   rosuvastatin (CRESTOR) 40 MG tablet   traMADol (ULTRAM) 50 MG tablet   vitamin B-12 (CYANOCOBALAMIN) 1000 MCG tablet   Vitamin D, Ergocalciferol, (DRISDOL) 1.25 MG (50000 UNIT) CAPS capsule   STELARA 90 MG/ML SOSY injection   torsemide (DEMADEX) 20 MG tablet   HISTORY: Past Medical  History:  Diagnosis Date   Anemia    Anxiety    a.) on BZO (clonazepam) PRN   Aortic atherosclerosis    Arthritis    B12 deficiency    Bilateral carotid artery disease    Chronic back pain    COPD (chronic obstructive pulmonary disease) (HCC)    Coronary artery calcification seen on CT scan    DDD (degenerative disc disease), lumbar    Depression    Diastolic dysfunction 09/01/2019   a.) TTE 09/01/2019: >55%; mild LA enlargement; triv PR, mild MR/TR; G1DD.   Dyspnea    Esophageal spasm    GERD (gastroesophageal reflux disease)    High cholesterol    History of methicillin resistant staphylococcus aureus (MRSA) 2005   Hypertension    Hypothyroidism    Long term current use of clopidogrel    Long term current use of immunosuppressive drug    a.) on Ixekizumab (Taltz) for psoriatic athritis   Migraines    Monoclonal gammopathy of unknown significance (MGUS) 01/01/2021   a.) IgG lambda MGUS   Morbid obesity (HCC)    OSA (obstructive sleep apnea)    Has not picked up cpap yet as of 12-03-23 due to cost   Pneumonia    Psoriasis    Psoriatic arthritis (HCC)    a.) on Ixekizumab (Taltz)   PSVT (paroxysmal supraventricular tachycardia)    Right carpal tunnel syndrome s/p release    Sensory polyneuropathy    T2DM (type 2 diabetes mellitus) (HCC)    Thyroid  nodule    Past Surgical History:  Procedure Laterality Date   ABDOMINAL HYSTERECTOMY  BACK SURGERY     2 level fusion   BREAST BIOPSY Left 08/23/2013   stereo, negative   BUNIONECTOMY Bilateral    carpal tunnel Right    CESAREAN SECTION     x1   COLONOSCOPY N/A 11/06/2023   Procedure: COLONOSCOPY;  Surgeon: Onita Elspeth Sharper, DO;  Location: [facility] ENDOSCOPY;  Service: Endoscopy;  Laterality: N/A;   COLONOSCOPY WITH ESOPHAGOGASTRODUODENOSCOPY (EGD)     CYST EXCISION     lower back   DILATION AND CURETTAGE OF UTERUS     ESOPHAGOGASTRODUODENOSCOPY N/A 11/06/2023   Procedure: EGD (ESOPHAGOGASTRODUODENOSCOPY);   Surgeon: Onita Elspeth Sharper, DO;  Location: [facility] ENDOSCOPY;  Service: Endoscopy;  Laterality: N/A;   KNEE ARTHROSCOPY Left 05/16/2021   Procedure: ARTHROSCOPY KNEE;  Surgeon: Mardee Lynwood SQUIBB, MD;  Location: [facility] ORS;  Service: Orthopedics;  Laterality: Left;   MALONEY DILATION  11/06/2023   Procedure: DILATION, ESOPHAGUS, USING MALONEY DILATOR;  Surgeon: Onita Elspeth Sharper, DO;  Location: [facility] ENDOSCOPY;  Service: Endoscopy;;   POLYPECTOMY  11/06/2023   Procedure: POLYPECTOMY, INTESTINE;  Surgeon: Onita Elspeth Sharper, DO;  Location: [facility] ENDOSCOPY;  Service: Endoscopy;;   SHOULDER ARTHROSCOPY WITH SUBACROMIAL DECOMPRESSION, ROTATOR CUFF REPAIR AND BICEP TENDON REPAIR Left 02/14/2022   Procedure: SHOULDER ARTHROSCOPY WITH DEBRIDEMENT, DECOMPRESSION, ROTATOR CUFF REPAIR AND BICEPS TENODESIS.;  Surgeon: Edie Norleen PARAS, MD;  Location: [facility] ORS;  Service: Orthopedics;  Laterality: Left;   tendinitis Right    wrist   TONSILLECTOMY     TRIGGER FINGER RELEASE Right    x 2 thumb and middle finger   Family History  Problem Relation Age of Onset   Breast cancer Mother 43   Social History   Tobacco Use   Smoking status: Former    Current packs/day: 0.00    Types: Cigarettes    Quit date: 03/11/2016    Years since quitting: 7.7   Smokeless tobacco: Never  Substance Use Topics   Alcohol use: Not Currently    Comment: last drink in 2023   LABS:  Hospital Outpatient Visit on 12/03/2023  Component Date Value Ref Range Status   CRP 12/03/2023 1.1 (H)  <1.0 mg/dL Final   Performed at [facility], [address] [place], [place] [address]   Sed Rate 12/03/2023 38 (H)  0 - 30 mm/hr Final   Performed at [facility], [address] [place], [place] [address]   Hgb A1c MFr Bld 12/03/2023 7.0 (H)  4.8 - 5.6 % Final   Comment: (NOTE) Diagnosis of Diabetes The following HbA1c ranges recommended by the American Diabetes Association (ADA) may be used as an aid in the diagnosis  of diabetes mellitus.  Hemoglobin             Suggested A1C NGSP%              Diagnosis  <5.7                   Non Diabetic  5.7-6.4                Pre-Diabetic  >6.4                   Diabetic  <7.0                   Glycemic control for                       adults with diabetes.  Mean Plasma Glucose 12/03/2023 154.2  mg/dL Final   Performed at [facility], [address], [place], [place] [address]   IgE (Immunoglobulin E), Serum 12/03/2023 168  6 - 495 IU/mL Final   Comment: (NOTE) Performed At: [facility] [address] [place], [place] [id number] Jennette Shorter MD Ey:[phone]    Sodium 12/03/2023 140  135 - 145 mmol/L Final   Potassium 12/03/2023 3.9  3.5 - 5.1 mmol/L Final   Chloride 12/03/2023 103  98 - 111 mmol/L Final   CO2 12/03/2023 26  22 - 32 mmol/L Final   Glucose, Bld 12/03/2023 146 (H)  70 - 99 mg/dL Final   Glucose reference range applies only to samples taken after fasting for at least 8 hours.   BUN 12/03/2023 14  8 - 23 mg/dL Final   Creatinine, Ser 12/03/2023 0.65  0.44 - 1.00 mg/dL Final   Calcium 90/75/7974 9.2  8.9 - 10.3 mg/dL Final   Total Protein 90/75/7974 7.6  6.5 - 8.1 g/dL Final   Albumin 90/75/7974 4.2  3.5 - 5.0 g/dL Final   AST 90/75/7974 30  15 - 41 U/L Final   ALT 12/03/2023 32  0 - 44 U/L Final   Alkaline Phosphatase 12/03/2023 68  38 - 126 U/L Final   Total Bilirubin 12/03/2023 0.7  0.0 - 1.2 mg/dL Final   GFR, Estimated 12/03/2023 >60  >60 mL/min Final   Comment: (NOTE) Calculated using the CKD-EPI Creatinine Equation (2021)    Anion gap 12/03/2023 11  5 - 15 Final   Performed at [facility], [address] [place], [place] [address]   Color, Urine 12/03/2023 YELLOW (A)  YELLOW Final   APPearance 12/03/2023 HAZY (A)  CLEAR Final   Specific Gravity, Urine 12/03/2023 1.012  1.005 - 1.030 Final   pH 12/03/2023 5.0  5.0 - 8.0 Final   Glucose, UA 12/03/2023 NEGATIVE  NEGATIVE mg/dL Final   Hgb  urine dipstick 12/03/2023 NEGATIVE  NEGATIVE Final   Bilirubin Urine 12/03/2023 NEGATIVE  NEGATIVE Final   Ketones, ur 12/03/2023 NEGATIVE  NEGATIVE mg/dL Final   Protein, ur 90/75/7974 NEGATIVE  NEGATIVE mg/dL Final   Nitrite 90/75/7974 NEGATIVE  NEGATIVE Final   Leukocytes,Ua 12/03/2023 NEGATIVE  NEGATIVE Final   Performed at [facility], [address] [place], [place] [address]   MRSA, PCR 12/03/2023 NEGATIVE  NEGATIVE Final   Staphylococcus aureus 12/03/2023 NEGATIVE  NEGATIVE Final   Comment: (NOTE) The Xpert SA Assay (FDA approved for NASAL specimens in patients 32 years of age and older), is one component of a comprehensive surveillance program. It is not intended to diagnose infection nor to guide or monitor treatment. Performed at [facility], [address] [place], [place] [address]     ECG: Date: 05/30/2023  Time ECG obtained: 1009 AM Rate: 81 bpm Rhythm: normal sinus Axis (leads I and aVF): normal Intervals: PR 174 ms. QRS 80 ms. QTc 443 ms. ST segment and T wave changes: No evidence of acute T wave abnormalities or significant ST segment elevation or depression.  Evidence of a possible, age undetermined, prior infarct:  Yes; anterior Comparison: Similar to previous tracing obtained on 02/08/2022   IMAGING / PROCEDURES: DIAGNOSTIC RADIOGRAPHS OF LEFT KNEE 3 VIEWS performed on 12/04/2023 Noticeable joint space closure along the medial cartilage space with bone-on-bone articulation noted.   Slight deformity along the tibial metadiaphysis consistent with potential previous injury.   Subchondral sclerosis is noted.   Osteophyte formation  is present.   Overall alignment is relative varus.   No fractures, lytic lesions or gross fomites appreciated on films.   PULMONARY FUNCTION TESTING performed on 07/30/2023 FVC was 2.56 L, 82 % of predicted  FEV1 was 1.85 L, 76 % of predicted  FEV1/FVC ratio was  92 % of predicted  FEF 25-75% liters per  second was 56 % of predicted  TLC was 99 % of predicted  RV was 132 % of predicted  DLCO was 107 % of predicted  DLCO/VA was 116 % of predicted  Good patient effort with good repeatability.   TRANSTHORACIC ECHOCARDIOGRAM performed on 03/21/2022 Normal left ventricular systolic function with an EF of >55% Mild LVH Left ventricular diastolic Doppler parameters consistent with abnormal relaxation (G1DD). Normal right ventricular size and function Trivial pan valvular regurgitation Normal gradients; no valvular stenosis  MYOCARDIAL PERFUSION IMAGING STUDY (LEXISCAN) performed on 09/03/2019 Normal left ventricular systolic function with a normal LVEF of 81% Normal myocardial thickening and wall motion Left ventricular cavity size normal SPECT images demonstrate homogenous tracer distribution throughout the myocardium No evidence of stress-induced myocardial ischemia or arrhythmia Normal low risk study  IMPRESSION AND PLAN: Taylor [name] has been referred for pre-anesthesia review and clearance prior to her undergoing the planned anesthetic and procedural courses. Available labs, pertinent testing, and imaging results were personally reviewed by me in preparation for upcoming operative/procedural course. [facility] medical record has been updated following extensive record review and patient interview with PAT staff.   ATTENTION --> PENDING CLEARANCE AT THIS TIME -- NOTE/CONTENTS NOT FINAL UNTIL SIGNED This patient has been appropriately cleared by cardiology with an overall *** risk of patient experiencing significant perioperative cardiovascular complications. Based on clinical review performed today (12/08/23), barring any significant acute changes in the patient's overall condition, it is anticipated that she will be able to proceed with the planned surgical intervention. Any acute changes in clinical condition may necessitate her procedure being postponed and/or cancelled. Patient will  meet with anesthesia team (MD and/or CRNA) on the day of her procedure for preoperative evaluation/assessment. Questions regarding anesthetic course will be fielded at that time.   Pre-surgical instructions were reviewed with the patient during his PAT appointment, and questions were fielded to satisfaction by PAT clinical staff. She has been instructed on which medications that she will need to hold prior to surgery, as well as the ones that have been deemed safe/appropriate to take on the day of her procedure. As part of the general education provided by PAT, patient made aware both verbally and in writing, that she would need to abstain from the use of any illegal substances during her perioperative course. She was advised that failure to follow the provided instructions could necessitate case cancellation or result in serious perioperative complications up to and including death. Patient encouraged to contact PAT and/or her surgeon's office to discuss any questions or concerns that may arise prior to surgery; verbalized understanding.   Dorise Pereyra, MSN, APRN, FNP-C, CEN [facility]  Perioperative Services Nurse Practitioner Phone: [phone] Fax: [phone] 12/08/23 11:18 AM  NOTE: This note has been prepared using Dragon dictation software. Despite my best ability to proofread, there is always the potential that unintentional transcriptional errors may still occur from this process.

## 2023-12-09 NOTE — H&P (Signed)
 ORTHOPAEDIC HISTORY & PHYSICAL Drake Fonda [place], [place] - 12/04/2023 9:15 AM EDT Formatting of this note is different from the original. NAME: Luvenia B Imber H&P Date: 12/04/2023 Procedure Date: 12/10/2023  Chief Complaint: left knee pain  HPI Taylor [name] is a 63 y.o. female who has severe Left knee pain. Patient reports a longstanding history of bilateral knee pain for which her left on average bothers her more than her right. Regarding her left knee, she states that most the pain localizes along the medial aspect of her knee. She does report intermittent swelling and giving way of the knee. Denies any locking or maltracking symptoms. She states the pain is made worse with any prolonged ambulation or weightbearing. It greatly limits her ability to ambulate long distances as well as perform her ADLs that she would normally like. She has failed conservative treatment including Tylenol , NSAIDs, intra-articular corticosteroid and viscosupplementation injections, as well as activity modification. She is not currently utilizing any ambulatory aids. She has requested operative intervention for relief of her DJD symptoms. Patient denies any previous cardiac issues. She does report history of COPD. No previous DVTs or clots. She is a diabetic, her last A1c was 7.0. She does have a history of arthroscopic knee surgery on this left side performed by Dr. Mardee in 2023. Of note patient also has an allergy to penicillin and aspirin.  Social Hx: Patient lives at home with her son who will be helping look after her postoperatively. She denies any alcohol use, illicit drug use, smoking or nicotine use.  Medications & Allergies Allergies: Allergies Allergen Reactions Aspirin Hives Penicillin G Hives  Home Medicines: Current Outpatient Medications on File Prior to Visit Medication Sig Dispense Refill ADVAIR HFA 115-21 mcg/actuation inhaler Inhale 2 inhalations into the lungs every 12 (twelve) hours 36 g  3 albuterol  MDI, PROVENTIL , VENTOLIN , PROAIR , HFA 90 mcg/actuation inhaler Inhale 2 inhalations into the lungs every 4 (four) hours as needed for Wheezing bisoproloL-hydroCHLOROthiazide (ZIAC) 10-6.25 mg tablet Take 1 tablet by mouth once daily 90 tablet 1 buPROPion (WELLBUTRIN XL) 150 MG XL tablet Take 1 tablet (150 mg total) by mouth once daily 30 tablet 5 busPIRone (BUSPAR) 10 MG tablet Take 1 tablet (10 mg total) by mouth 2 (two) times daily 60 tablet 11 clonazePAM (KLONOPIN) 0.5 MG tablet Take 1 tablet (0.5 mg total) by mouth 3 (three) times daily as needed for Anxiety 60 tablet 2 clopidogreL (PLAVIX) 75 mg tablet Take 1 tablet (75 mg total) by mouth once daily 30 tablet 0 ergocalciferol, vitamin D2, 1,250 mcg (50,000 unit) capsule Take 50,000 Units by mouth once a week escitalopram oxalate (LEXAPRO) 20 MG tablet TAKE 1 TABLET(20 MG) BY MOUTH DAILY 90 tablet 3 glimepiride (AMARYL) 4 MG tablet Take 1 tablet (4 mg total) by mouth 2 (two) times daily 180 tablet 1 levothyroxine (SYNTHROID) 50 MCG tablet Take 1.5 tablets (75 mcg total) by mouth once daily Take on an empty stomach with a glass of water at least 30-60 minutes before breakfast. 135 tablet 1 nortriptyline (PAMELOR) 25 MG capsule Take 1 capsule (25 mg total) by mouth at bedtime 30 capsule 5 pregabalin (LYRICA) 200 MG capsule Take 1 capsule (200 mg total) by mouth 2 (two) times daily 60 capsule 5 rosuvastatin (CRESTOR) 40 MG tablet Take 1 tablet (40 mg total) by mouth once daily 90 tablet 1 STELARA 90 mg/mL injection syringe Inject 90 mg subcutaneously once every 8 (eight) weeks TORsemide (DEMADEX) 20 MG tablet TAKE 1 TABLET(20 MG) BY MOUTH  DAILY AS NEEDED FOR UP TO 90 DAYS 90 tablet 0 traMADoL (ULTRAM) 50 mg tablet Take 1 tablet (50 mg total) by mouth 2 (two) times daily as needed for Pain 60 tablet 2 albuterol  MDI, PROVENTIL , VENTOLIN , PROAIR , HFA 90 mcg/actuation inhaler Inhale 2 inhalations into the lungs every 6 (six) hours as needed  for up to 90 days VENTOLIN  ONLY PLEASE 54 g 3 bimekizumab-bkzx (BIMZELX AUTOINJECTOR) 320 mg/2 mL AtIn Inject 320 mg subcutaneously every 8 (eight) weeks MAINTENANCE DOSE Inject 320MG  ( two 160MG  injections) SQ every 8 weeks (Patient not taking: Reported on 12/04/2023) 6 mL 1 ergocalciferol, vitamin D2, 1,250 mcg (50,000 unit) capsule Take 1 capsule (50,000 Units total) by mouth once a week for 30 days 12 capsule 3 naproxen sodium 220 mg Cap Take 440 mg by mouth every 6 (six) hours as needed (Patient not taking: Reported on 12/04/2023) omeprazole (PRILOSEC) 20 MG DR capsule Take 20 mg by mouth once daily as needed Take 30 minutes before breakfast. (Patient not taking: Reported on 12/04/2023)  Current Facility-Administered Medications on File Prior to Visit Medication Dose Route Frequency Provider Last Rate Last Admin cyanocobalamin (VITAMIN B12) injection 1,000 mcg 1,000 mcg Intramuscular Q30 Days Auston Reyes BIRCH, MD 1,000 mcg at 11/25/23 0912  Medical / Surgical History  Past Medical History: Diagnosis Date Anemia Back pain Chickenpox Diabetes mellitus type 2, uncomplicated (CMS/HHS-HCC) Gestational Esophageal spasm Hx of migraine headaches Hyperlipidemia Hypertension Psoriasis Thyroid  nodule thyroid  ultrasound 03/2014   Past Surgical History: Procedure Laterality Date COLONOSCOPY 09/17/2013 Indic: Screening. 1 Tubular Adenoma. COLONOSCOPY 08/13/2018 Indic: Diarrhea. Descending diverticulitis. Random bx - normal colonic mucosa. PHx polyps - CBF 08/2023 EGD 08/13/2018 Indic: Dysphagia, GERD. Normal esophagus. Normal stomach (bx - reactive foveolar hyperplasia, fibrosis, and mixed inflammation c/w tissue adjacent to erosion; minimal chronic gastritis). Duodenitis in 2nd portion of duodenum (path - normal duodenal mucosa). Left knee arthroscopy, partial medial meniscectomy, and chondroplasty 05/16/2021 Dr Mardee Colon @ [facility] 11/06/2023 Sessile serrated adenoma/Tubular adenoma/Repeat  47yrs/SMR EGD @ [facility] 11/06/2023 Gastritis/No repeat/SMR back surgery BUNION CORRECTION CESAREAN SECTION ENDOSCOPIC CARPAL TUNNEL RELEASE Right HYSTERECTOMY RIGHT LONG TRIGGER FINGER RELEASE RIGHT TRIGGER THUMB RELEASE Tendon surgery TRIGGER FINGER RELEASE   Physical Exam  Ht:165.1 cm (5' 5) Wt:(!) 113.4 kg (250 lb) BMI: Body mass index is 41.6 kg/m.  General/Constitutional: No apparent distress: well-nourished and well developed. Eyes: Pupils equal, round with synchronous movement. Lymphatic: No palpable adenopathy. Respiratory: Patient has good chest rise and fall with inspiration and expiration. All lung fields are clear to auscultation bilaterally. There is no Rales, rhonchi or wheezes appreciated. Cardiovascular: Upon auscultation there is a regular rate and rhythm without any murmurs, rubs, gallops or heaves appreciated. There does not appear to be any swelling down the lower extremities. Posterior tibial pulses appreciated bilaterally, 2+. Integumentary: No impressive skin lesions present, except as noted in detailed exam. Neuro/Psych: Normal mood and affect, oriented to person, place and time. Musculoskeletal: see exam below  Left knee exam Upon inspection of the patient's left knee there does not appear to be any skin changes, open abrasions, swelling or redness. There is a relative varus alignment. Upon palpation, the patient reports having pain along the EDL aspect of their knee. Patient has 4 degrees off full extension actively with ROM, and able to flex back to 110 with mild pain. Varus and valgus stress testing shows positive pseudolaxity to valgus stressing. The patella tracks well within the femoral groove from flexion into extension with mild crepitus. Anterior and posterior drawer  testing negative. Patient is neurovascularly intact down their lower extremity to all dermatomes. Posterior tibial pulses appreciated 2+.  Imaging left Knee Imaging: A series of x-rays were  ordered and interpreted of the patient's left knee. These images included AP weightbearing, lateral and sunrise views. Upon inspection, there is noticeable joint space closure along the medial cartilage space with bone-on-bone articulation noted. Slight deformity along the tibial metadiaphysis consistent with potential previous injury. Subchondral sclerosis is noted. Osteophyte formation is present. Overall alignment is relative varus. No fractures, lytic lesions or gross fomites appreciated on films.  Assesment and Plan Knee DJD  I have recommended that Laniece B Carvalho undergo left total knee replacement. Consents has been signed. The risks, benefits, prognosis and alternatives including but not limited to DVT, PE, infection, neurovascular injury, failure of the procedure and death were explained to the patient and she is willing to proceed with surgery as described to her by myself. Plan will be for post operative admission of at least 1 midnight for pain control and PT. She will be managed with DVT prophylaxis, antibiotics preoperatively for 24 hours and aggressive in patient rehab.  Pre, intra and post op interventions were discussed. Patient has good understanding  Medication Reconciliation was performed. Discussed cessation of NSAIDs, Plavix, vitamins and supplements.  A total of 45 minutes was spent reviewing patient's charts, medical reconciliation, discussing/educating the patient about surgical interventions, and answering any questions provided by the patient.  JOSHUA DALLAS KOYANAGI, PA [facility] 12/04/2023  Electronically signed by KOYANAGI Fonda DALLAS, PA at 12/04/2023 12:58 PM EDT

## 2023-12-10 ENCOUNTER — Observation Stay

## 2023-12-10 ENCOUNTER — Encounter
Admission: RE | Disposition: A | Discharge status: Home / Self Care | Payer: Self-pay | Source: Home / Self Care | Attending: Orthopedic Surgery

## 2023-12-10 ENCOUNTER — Ambulatory Visit: Payer: Self-pay | Admitting: Urgent Care

## 2023-12-10 ENCOUNTER — Other Ambulatory Visit: Payer: Self-pay

## 2023-12-10 ENCOUNTER — Observation Stay
Admission: RE | Admit: 2023-12-10 | Discharge: 2023-12-11 | Disposition: A | Discharge status: Home / Self Care | Attending: Orthopedic Surgery | Admitting: Orthopedic Surgery

## 2023-12-10 ENCOUNTER — Encounter: Payer: Self-pay | Admitting: Orthopedic Surgery

## 2023-12-10 DIAGNOSIS — E039 Hypothyroidism, unspecified: Secondary | ICD-10-CM | POA: Diagnosis not present

## 2023-12-10 DIAGNOSIS — Z87891 Personal history of nicotine dependence: Secondary | ICD-10-CM | POA: Insufficient documentation

## 2023-12-10 DIAGNOSIS — R739 Hyperglycemia, unspecified: Secondary | ICD-10-CM

## 2023-12-10 DIAGNOSIS — I10 Essential (primary) hypertension: Secondary | ICD-10-CM | POA: Diagnosis not present

## 2023-12-10 DIAGNOSIS — Z01818 Encounter for other preprocedural examination: Secondary | ICD-10-CM

## 2023-12-10 DIAGNOSIS — Z96652 Presence of left artificial knee joint: Secondary | ICD-10-CM | POA: Insufficient documentation

## 2023-12-10 DIAGNOSIS — I2510 Atherosclerotic heart disease of native coronary artery without angina pectoris: Secondary | ICD-10-CM | POA: Diagnosis not present

## 2023-12-10 DIAGNOSIS — Z880 Allergy status to penicillin: Secondary | ICD-10-CM

## 2023-12-10 DIAGNOSIS — M1712 Unilateral primary osteoarthritis, left knee: Secondary | ICD-10-CM | POA: Diagnosis present

## 2023-12-10 DIAGNOSIS — Z7982 Long term (current) use of aspirin: Secondary | ICD-10-CM | POA: Diagnosis not present

## 2023-12-10 DIAGNOSIS — K2950 Unspecified chronic gastritis without bleeding: Secondary | ICD-10-CM | POA: Insufficient documentation

## 2023-12-10 DIAGNOSIS — J449 Chronic obstructive pulmonary disease, unspecified: Secondary | ICD-10-CM | POA: Insufficient documentation

## 2023-12-10 DIAGNOSIS — E119 Type 2 diabetes mellitus without complications: Secondary | ICD-10-CM | POA: Diagnosis not present

## 2023-12-10 DIAGNOSIS — E118 Type 2 diabetes mellitus with unspecified complications: Secondary | ICD-10-CM

## 2023-12-10 DIAGNOSIS — D509 Iron deficiency anemia, unspecified: Secondary | ICD-10-CM

## 2023-12-10 HISTORY — DX: Nontoxic single thyroid nodule: E04.1

## 2023-12-10 HISTORY — DX: Carpal tunnel syndrome, right upper limb: G56.01

## 2023-12-10 HISTORY — PX: KNEE ARTHROPLASTY: SHX992

## 2023-12-10 HISTORY — DX: Dyskinesia of esophagus: K22.4

## 2023-12-10 HISTORY — DX: Psoriasis, unspecified: L40.9

## 2023-12-10 HISTORY — DX: Long term (current) use of antithrombotics/antiplatelets: Z79.02

## 2023-12-10 LAB — GLUCOSE, CAPILLARY
Glucose-Capillary: 146 mg/dL — ABNORMAL HIGH (ref 70–99)
Glucose-Capillary: 238 mg/dL — ABNORMAL HIGH (ref 70–99)
Glucose-Capillary: 217 mg/dL — ABNORMAL HIGH (ref 70–99)
Glucose-Capillary: 220 mg/dL — ABNORMAL HIGH (ref 70–99)

## 2023-12-10 SURGERY — ARTHROPLASTY, KNEE, TOTAL, USING IMAGELESS COMPUTER-ASSISTED NAVIGATION
Anesthesia: Spinal | Site: Knee | Laterality: Left

## 2023-12-10 MED ORDER — DEXAMETHASONE SODIUM PHOSPHATE 10 MG/ML IJ SOLN
INTRAMUSCULAR | Status: AC
  Filled 2023-12-10: qty 1

## 2023-12-10 MED ORDER — LEVOTHYROXINE SODIUM 25 MCG PO TABS
75.0000 ug | ORAL_TABLET | Freq: Every day | ORAL | Status: DC
  Administered 2023-12-11: 75 ug via ORAL
  Filled 2023-12-10: qty 3

## 2023-12-10 MED ORDER — MENTHOL 3 MG MT LOZG: 1.0000 | LOZENGE | OROMUCOSAL | Status: DC | PRN

## 2023-12-10 MED ORDER — INSULIN ASPART 100 UNIT/ML IJ SOLN
0.0000 [IU] | Freq: Three times a day (TID) | INTRAMUSCULAR | Status: DC
  Administered 2023-12-10: 5 [IU] via SUBCUTANEOUS
  Administered 2023-12-11: 3 [IU] via SUBCUTANEOUS
  Filled 2023-12-10 (×2): qty 1

## 2023-12-10 MED ORDER — SODIUM CHLORIDE (PF) 0.9 % IJ SOLN
INTRAMUSCULAR | Status: DC | PRN
  Administered 2023-12-10: 120 mL via INTRAMUSCULAR

## 2023-12-10 MED ORDER — ESCITALOPRAM OXALATE 20 MG PO TABS
20.0000 mg | ORAL_TABLET | Freq: Every day | ORAL | Status: DC
  Administered 2023-12-10: 20 mg via ORAL
  Filled 2023-12-10: qty 1

## 2023-12-10 MED ORDER — MIDAZOLAM HCL 5 MG/5ML IJ SOLN
INTRAMUSCULAR | Status: DC | PRN
  Administered 2023-12-10 (×2): 1 mg via INTRAVENOUS

## 2023-12-10 MED ORDER — ACETAMINOPHEN 10 MG/ML IV SOLN
1000.0000 mg | Freq: Four times a day (QID) | INTRAVENOUS | Status: AC
Start: 2023-12-10 — End: 2023-12-11
  Administered 2023-12-10 – 2023-12-11 (×4): 1000 mg via INTRAVENOUS
  Filled 2023-12-10 (×4): qty 100

## 2023-12-10 MED ORDER — NORTRIPTYLINE HCL 25 MG PO CAPS
25.0000 mg | ORAL_CAPSULE | Freq: Every day | ORAL | Status: DC
  Administered 2023-12-10: 25 mg via ORAL
  Filled 2023-12-10: qty 1

## 2023-12-10 MED ORDER — FENTANYL CITRATE (PF) 100 MCG/2ML IJ SOLN
INTRAMUSCULAR | Status: AC
  Filled 2023-12-10: qty 2

## 2023-12-10 MED ORDER — MAGNESIUM HYDROXIDE 400 MG/5ML PO SUSP: 30.0000 mL | Freq: Every day | ORAL | Status: DC

## 2023-12-10 MED ORDER — PROPOFOL 1000 MG/100ML IV EMUL
INTRAVENOUS | Status: AC
  Filled 2023-12-10: qty 100

## 2023-12-10 MED ORDER — BUSPIRONE HCL 10 MG PO TABS
10.0000 mg | ORAL_TABLET | Freq: Two times a day (BID) | ORAL | Status: DC
  Administered 2023-12-10 – 2023-12-11 (×2): 10 mg via ORAL
  Filled 2023-12-10 (×2): qty 1

## 2023-12-10 MED ORDER — ORAL CARE MOUTH RINSE: 15.0000 mL | Freq: Once | OROMUCOSAL | Status: AC

## 2023-12-10 MED ORDER — OXYCODONE HCL 5 MG PO TABS
ORAL_TABLET | ORAL | Status: AC
  Filled 2023-12-10: qty 1

## 2023-12-10 MED ORDER — BISACODYL 10 MG RE SUPP: 10.0000 mg | Freq: Every day | RECTAL | Status: DC | PRN

## 2023-12-10 MED ORDER — CLOPIDOGREL BISULFATE 75 MG PO TABS
75.0000 mg | ORAL_TABLET | Freq: Every day | ORAL | Status: DC
  Administered 2023-12-11: 75 mg via ORAL
  Filled 2023-12-10: qty 1

## 2023-12-10 MED ORDER — BUPIVACAINE-EPINEPHRINE (PF) 0.25% -1:200000 IJ SOLN
INTRAMUSCULAR | Status: AC
  Filled 2023-12-10: qty 120

## 2023-12-10 MED ORDER — CHLORHEXIDINE GLUCONATE 0.12 % MT SOLN
OROMUCOSAL | Status: AC
  Filled 2023-12-10: qty 15

## 2023-12-10 MED ORDER — PANTOPRAZOLE SODIUM 40 MG PO TBEC
40.0000 mg | DELAYED_RELEASE_TABLET | Freq: Two times a day (BID) | ORAL | Status: DC
  Administered 2023-12-10 – 2023-12-11 (×3): 40 mg via ORAL
  Filled 2023-12-10 (×3): qty 1

## 2023-12-10 MED ORDER — ONDANSETRON HCL 4 MG/2ML IJ SOLN
INTRAMUSCULAR | Status: AC
Start: 2023-12-10 — End: 2023-12-10
  Filled 2023-12-10: qty 2

## 2023-12-10 MED ORDER — GLIMEPIRIDE 4 MG PO TABS
4.0000 mg | ORAL_TABLET | Freq: Two times a day (BID) | ORAL | Status: DC
  Administered 2023-12-10 – 2023-12-11 (×2): 4 mg via ORAL
  Filled 2023-12-10 (×2): qty 1

## 2023-12-10 MED ORDER — BUPIVACAINE HCL (PF) 0.5 % IJ SOLN
INTRAMUSCULAR | Status: DC | PRN
  Administered 2023-12-10: 3 mL via INTRATHECAL

## 2023-12-10 MED ORDER — FLEET ENEMA RE ENEM: 1.0000 | ENEMA | Freq: Once | RECTAL | Status: DC | PRN

## 2023-12-10 MED ORDER — TORSEMIDE 20 MG PO TABS: 20.0000 mg | ORAL_TABLET | ORAL | Status: DC | PRN

## 2023-12-10 MED ORDER — FENTANYL CITRATE (PF) 100 MCG/2ML IJ SOLN: 25.0000 ug | INTRAMUSCULAR | Status: DC | PRN

## 2023-12-10 MED ORDER — OXYCODONE HCL 5 MG PO TABS
5.0000 mg | ORAL_TABLET | ORAL | Status: DC | PRN
  Administered 2023-12-10 (×2): 5 mg via ORAL
  Filled 2023-12-10: qty 1

## 2023-12-10 MED ORDER — LIDOCAINE HCL (PF) 2 % IJ SOLN
INTRAMUSCULAR | Status: AC
  Filled 2023-12-10: qty 5

## 2023-12-10 MED ORDER — TRAMADOL HCL 50 MG PO TABS: 50.0000 mg | ORAL_TABLET | ORAL | Status: DC | PRN

## 2023-12-10 MED ORDER — HYDROMORPHONE HCL 1 MG/ML IJ SOLN: 0.5000 mg | INTRAMUSCULAR | Status: DC | PRN

## 2023-12-10 MED ORDER — BUPIVACAINE HCL (PF) 0.5 % IJ SOLN
INTRAMUSCULAR | Status: AC
  Filled 2023-12-10: qty 10

## 2023-12-10 MED ORDER — SODIUM CHLORIDE 0.9 % IV SOLN
INTRAVENOUS | Status: DC
Start: 2023-12-10 — End: 2023-12-11

## 2023-12-10 MED ORDER — FLUCONAZOLE 50 MG PO TABS
150.0000 mg | ORAL_TABLET | Freq: Once | ORAL | Status: AC
  Administered 2023-12-10: 150 mg via ORAL
  Filled 2023-12-10: qty 1

## 2023-12-10 MED ORDER — ONDANSETRON HCL 4 MG PO TABS: 4.0000 mg | ORAL_TABLET | Freq: Four times a day (QID) | ORAL | Status: DC | PRN

## 2023-12-10 MED ORDER — FERROUS SULFATE 325 (65 FE) MG PO TABS
325.0000 mg | ORAL_TABLET | Freq: Two times a day (BID) | ORAL | Status: DC
  Administered 2023-12-10 – 2023-12-11 (×2): 325 mg via ORAL
  Filled 2023-12-10 (×2): qty 1

## 2023-12-10 MED ORDER — CELECOXIB 200 MG PO CAPS
ORAL_CAPSULE | ORAL | Status: AC
  Filled 2023-12-10: qty 2

## 2023-12-10 MED ORDER — CELECOXIB 200 MG PO CAPS
200.0000 mg | ORAL_CAPSULE | Freq: Two times a day (BID) | ORAL | Status: DC
  Administered 2023-12-10 – 2023-12-11 (×2): 200 mg via ORAL
  Filled 2023-12-10 (×2): qty 1

## 2023-12-10 MED ORDER — ALBUTEROL SULFATE (2.5 MG/3ML) 0.083% IN NEBU: 3.0000 mL | INHALATION_SOLUTION | Freq: Four times a day (QID) | RESPIRATORY_TRACT | Status: DC | PRN

## 2023-12-10 MED ORDER — CEFAZOLIN SODIUM-DEXTROSE 2-4 GM/100ML-% IV SOLN
2.0000 g | Freq: Four times a day (QID) | INTRAVENOUS | Status: AC
  Administered 2023-12-10 (×2): 2 g via INTRAVENOUS
  Filled 2023-12-10 (×2): qty 100

## 2023-12-10 MED ORDER — CEFAZOLIN SODIUM-DEXTROSE 2-4 GM/100ML-% IV SOLN
INTRAVENOUS | Status: AC
  Filled 2023-12-10: qty 100

## 2023-12-10 MED ORDER — TRANEXAMIC ACID-NACL 1000-0.7 MG/100ML-% IV SOLN
1000.0000 mg | INTRAVENOUS | Status: AC
Start: 2023-12-10 — End: 2023-12-10
  Administered 2023-12-10: 1000 mg via INTRAVENOUS

## 2023-12-10 MED ORDER — BISOPROLOL-HYDROCHLOROTHIAZIDE 10-6.25 MG PO TABS
1.0000 | ORAL_TABLET | Freq: Every day | ORAL | Status: DC
Start: 2023-12-10 — End: 2023-12-11
  Filled 2023-12-10: qty 1

## 2023-12-10 MED ORDER — PROPOFOL 10 MG/ML IV BOLUS
INTRAVENOUS | Status: DC | PRN
  Administered 2023-12-10 (×2): 30 mg via INTRAVENOUS

## 2023-12-10 MED ORDER — ROSUVASTATIN CALCIUM 10 MG PO TABS
40.0000 mg | ORAL_TABLET | Freq: Every day | ORAL | Status: DC
Start: 2023-12-10 — End: 2023-12-11
  Administered 2023-12-10 – 2023-12-11 (×2): 40 mg via ORAL
  Filled 2023-12-10 (×2): qty 4

## 2023-12-10 MED ORDER — LIDOCAINE HCL (CARDIAC) PF 100 MG/5ML IV SOSY
PREFILLED_SYRINGE | INTRAVENOUS | Status: DC | PRN
  Administered 2023-12-10: 60 mg via INTRAVENOUS

## 2023-12-10 MED ORDER — CELECOXIB 200 MG PO CAPS
400.0000 mg | ORAL_CAPSULE | Freq: Once | ORAL | Status: AC
  Administered 2023-12-10: 400 mg via ORAL

## 2023-12-10 MED ORDER — GABAPENTIN 300 MG PO CAPS
ORAL_CAPSULE | ORAL | Status: AC
Start: 2023-12-10 — End: 2023-12-10
  Filled 2023-12-10: qty 1

## 2023-12-10 MED ORDER — GABAPENTIN 300 MG PO CAPS: 300.0000 mg | ORAL_CAPSULE | Freq: Once | ORAL | Status: DC

## 2023-12-10 MED ORDER — MIDAZOLAM HCL 2 MG/2ML IJ SOLN
INTRAMUSCULAR | Status: AC
  Filled 2023-12-10: qty 2

## 2023-12-10 MED ORDER — ALUM & MAG HYDROXIDE-SIMETH 200-200-20 MG/5ML PO SUSP: 30.0000 mL | ORAL | Status: DC | PRN

## 2023-12-10 MED ORDER — ASPIRIN 81 MG PO CHEW
81.0000 mg | CHEWABLE_TABLET | Freq: Two times a day (BID) | ORAL | Status: DC
Start: 2023-12-11 — End: 2023-12-11
  Administered 2023-12-11: 81 mg via ORAL
  Filled 2023-12-10: qty 1

## 2023-12-10 MED ORDER — DEXAMETHASONE SODIUM PHOSPHATE 10 MG/ML IJ SOLN
8.0000 mg | Freq: Once | INTRAMUSCULAR | Status: AC
  Administered 2023-12-10: 8 mg via INTRAVENOUS

## 2023-12-10 MED ORDER — BUPIVACAINE LIPOSOME 1.3 % IJ SUSP
INTRAMUSCULAR | Status: AC
  Filled 2023-12-10: qty 40

## 2023-12-10 MED ORDER — SODIUM CHLORIDE 0.9 % IR SOLN
Status: DC | PRN
  Administered 2023-12-10: 3000 mL

## 2023-12-10 MED ORDER — ONDANSETRON HCL 4 MG/2ML IJ SOLN
INTRAMUSCULAR | Status: DC | PRN
  Administered 2023-12-10: 4 mg via INTRAVENOUS

## 2023-12-10 MED ORDER — SURGIPHOR WOUND IRRIGATION SYSTEM - OPTIME: TOPICAL | Status: DC | PRN

## 2023-12-10 MED ORDER — PHENOL 1.4 % MT LIQD: 1.0000 | OROMUCOSAL | Status: DC | PRN

## 2023-12-10 MED ORDER — OXYCODONE HCL 5 MG PO TABS: 10.0000 mg | ORAL_TABLET | ORAL | Status: DC | PRN

## 2023-12-10 MED ORDER — INSULIN ASPART 100 UNIT/ML IJ SOLN
0.0000 [IU] | Freq: Every day | INTRAMUSCULAR | Status: DC
  Administered 2023-12-10: 2 [IU] via SUBCUTANEOUS
  Filled 2023-12-10: qty 1

## 2023-12-10 MED ORDER — DIPHENHYDRAMINE HCL 12.5 MG/5ML PO ELIX: 12.5000 mg | ORAL_SOLUTION | ORAL | Status: DC | PRN

## 2023-12-10 MED ORDER — PROPOFOL 500 MG/50ML IV EMUL
INTRAVENOUS | Status: DC | PRN
  Administered 2023-12-10: 125 ug/kg/min via INTRAVENOUS

## 2023-12-10 MED ORDER — ACETAMINOPHEN 325 MG PO TABS: 325.0000 mg | ORAL_TABLET | Freq: Four times a day (QID) | ORAL | Status: DC | PRN

## 2023-12-10 MED ORDER — TRANEXAMIC ACID-NACL 1000-0.7 MG/100ML-% IV SOLN
INTRAVENOUS | Status: AC
Start: 2023-12-10 — End: 2023-12-10
  Filled 2023-12-10: qty 100

## 2023-12-10 MED ORDER — ONDANSETRON HCL 4 MG/2ML IJ SOLN: 4.0000 mg | Freq: Four times a day (QID) | INTRAMUSCULAR | Status: DC | PRN

## 2023-12-10 MED ORDER — CEFAZOLIN SODIUM-DEXTROSE 2-4 GM/100ML-% IV SOLN
2.0000 g | INTRAVENOUS | Status: AC
  Administered 2023-12-10: 2 g via INTRAVENOUS

## 2023-12-10 MED ORDER — DROPERIDOL 2.5 MG/ML IJ SOLN: 0.6250 mg | Freq: Once | INTRAMUSCULAR | Status: DC | PRN

## 2023-12-10 MED ORDER — CHLORHEXIDINE GLUCONATE 4 % EX SOLN: 60.0000 mL | Freq: Once | CUTANEOUS | Status: DC

## 2023-12-10 MED ORDER — SODIUM CHLORIDE (PF) 0.9 % IJ SOLN
INTRAMUSCULAR | Status: AC
  Filled 2023-12-10: qty 80

## 2023-12-10 MED ORDER — ACETAMINOPHEN 10 MG/ML IV SOLN
INTRAVENOUS | Status: DC | PRN
Start: 2023-12-10 — End: 2023-12-10
  Administered 2023-12-10: 1000 mg via INTRAVENOUS

## 2023-12-10 MED ORDER — OXYCODONE HCL 5 MG PO TABS
5.0000 mg | ORAL_TABLET | Freq: Once | ORAL | Status: AC
  Administered 2023-12-10: 5 mg via ORAL

## 2023-12-10 MED ORDER — BUPIVACAINE HCL (PF) 0.25 % IJ SOLN
INTRAMUSCULAR | Status: AC
  Filled 2023-12-10: qty 120

## 2023-12-10 MED ORDER — CLONAZEPAM 0.5 MG PO TABS: 0.5000 mg | ORAL_TABLET | Freq: Two times a day (BID) | ORAL | Status: DC | PRN

## 2023-12-10 MED ORDER — TRANEXAMIC ACID-NACL 1000-0.7 MG/100ML-% IV SOLN
INTRAVENOUS | Status: AC
  Filled 2023-12-10: qty 100

## 2023-12-10 MED ORDER — METOCLOPRAMIDE HCL 10 MG PO TABS
10.0000 mg | ORAL_TABLET | Freq: Three times a day (TID) | ORAL | Status: DC
Start: 2023-12-10 — End: 2023-12-12
  Administered 2023-12-10 – 2023-12-11 (×3): 10 mg via ORAL
  Filled 2023-12-10 (×3): qty 1

## 2023-12-10 MED ORDER — ASPIRIN 81 MG PO CHEW: 81.0000 mg | CHEWABLE_TABLET | Freq: Two times a day (BID) | ORAL | Status: DC

## 2023-12-10 MED ORDER — FENTANYL CITRATE (PF) 100 MCG/2ML IJ SOLN
INTRAMUSCULAR | Status: DC | PRN
  Administered 2023-12-10: 50 ug via INTRAVENOUS

## 2023-12-10 MED ORDER — ACETAMINOPHEN 10 MG/ML IV SOLN
INTRAVENOUS | Status: AC
  Filled 2023-12-10: qty 100

## 2023-12-10 MED ORDER — FLUTICASONE FUROATE-VILANTEROL 200-25 MCG/ACT IN AEPB
1.0000 | INHALATION_SPRAY | Freq: Every day | RESPIRATORY_TRACT | Status: DC
Start: 2023-12-11 — End: 2023-12-11
  Filled 2023-12-10: qty 28

## 2023-12-10 MED ORDER — BUPROPION HCL ER (XL) 150 MG PO TB24
150.0000 mg | ORAL_TABLET | Freq: Every day | ORAL | Status: DC
  Administered 2023-12-10: 150 mg via ORAL
  Filled 2023-12-10: qty 1

## 2023-12-10 MED ORDER — PREGABALIN 75 MG PO CAPS
200.0000 mg | ORAL_CAPSULE | Freq: Two times a day (BID) | ORAL | Status: DC
  Administered 2023-12-10 – 2023-12-11 (×2): 200 mg via ORAL
  Filled 2023-12-10 (×2): qty 1

## 2023-12-10 MED ORDER — SODIUM CHLORIDE 0.9 % IV SOLN: INTRAVENOUS | Status: DC

## 2023-12-10 MED ORDER — TRANEXAMIC ACID-NACL 1000-0.7 MG/100ML-% IV SOLN
1000.0000 mg | Freq: Once | INTRAVENOUS | Status: AC
  Administered 2023-12-10: 1000 mg via INTRAVENOUS

## 2023-12-10 MED ORDER — CHLORHEXIDINE GLUCONATE 0.12 % MT SOLN
15.0000 mL | Freq: Once | OROMUCOSAL | Status: AC
  Administered 2023-12-10: 15 mL via OROMUCOSAL

## 2023-12-10 MED ORDER — SENNOSIDES-DOCUSATE SODIUM 8.6-50 MG PO TABS
1.0000 | ORAL_TABLET | Freq: Two times a day (BID) | ORAL | Status: DC
  Administered 2023-12-10 – 2023-12-11 (×3): 1 via ORAL
  Filled 2023-12-10 (×3): qty 1

## 2023-12-10 SURGICAL SUPPLY — 64 items
ATTUNE MED DOME PAT 32 KNEE (Knees) IMPLANT
ATTUNE PS FEM LT SZ 5 CEM KNEE (Femur) IMPLANT
ATTUNE PSRP INSR SZ5 5 KNEE (Insert) IMPLANT
BASEPLATE TIBIAL ROTATING SZ 4 (Knees) IMPLANT
BATTERY INSTRU NAVIGATION (MISCELLANEOUS) ×4 IMPLANT
BIT DRILL QUICK REL 1/8 2PK SL (BIT) ×1 IMPLANT
BLADE CLIPPER SURG (BLADE) IMPLANT
BLADE SAW 70X12.5 (BLADE) ×1 IMPLANT
BLADE SAW 90X13X1.19 OSCILLAT (BLADE) ×1 IMPLANT
BLADE SAW 90X25X1.19 OSCILLAT (BLADE) ×1 IMPLANT
BRUSH SCRUB EZ PLAIN DRY (MISCELLANEOUS) ×1 IMPLANT
CEMENT BONE GENTAMICIN 40 (Cement) IMPLANT
COOLER ICEMAN CLASSIC (MISCELLANEOUS) ×1 IMPLANT
CUFF TRNQT CYL 24X4X16.5-23 (TOURNIQUET CUFF) IMPLANT
CUFF TRNQT CYL 30X4X21-28X (TOURNIQUET CUFF) IMPLANT
DRAPE SHEET LG 3/4 BI-LAMINATE (DRAPES) ×1 IMPLANT
DRSG AQUACEL AG ADV 3.5X14 (GAUZE/BANDAGES/DRESSINGS) ×1 IMPLANT
DRSG MEPILEX SACRM 8.7X9.8 (GAUZE/BANDAGES/DRESSINGS) ×1 IMPLANT
DRSG TEGADERM 4X4.75 (GAUZE/BANDAGES/DRESSINGS) ×1 IMPLANT
DURAPREP 26ML APPLICATOR (WOUND CARE) ×2 IMPLANT
ELECT CAUTERY BLADE 6.4 (BLADE) ×1 IMPLANT
ELECTRODE REM PT RTRN 9FT ADLT (ELECTROSURGICAL) ×1 IMPLANT
EVACUATOR 1/8 PVC DRAIN (DRAIN) ×1 IMPLANT
EX-PIN ORTHOLOCK NAV 4X150 (PIN) ×2 IMPLANT
GAUZE XEROFORM 1X8 LF (GAUZE/BANDAGES/DRESSINGS) ×1 IMPLANT
GLOVE BIOGEL M STRL SZ7.5 (GLOVE) ×6 IMPLANT
GLOVE BIOGEL PI IND STRL 8 (GLOVE) ×1 IMPLANT
GLOVE SRG 8 PF TXTR STRL LF DI (GLOVE) ×1 IMPLANT
GOWN STRL REUS W/ TWL LRG LVL3 (GOWN DISPOSABLE) ×1 IMPLANT
GOWN STRL REUS W/ TWL XL LVL3 (GOWN DISPOSABLE) ×1 IMPLANT
GOWN TOGA ZIPPER T7+ PEEL AWAY (MISCELLANEOUS) ×1 IMPLANT
HOLDER FOLEY CATH W/STRAP (MISCELLANEOUS) ×1 IMPLANT
HOOD PEEL AWAY T7 (MISCELLANEOUS) ×1 IMPLANT
KIT TURNOVER KIT A (KITS) ×1 IMPLANT
KNIFE SCULPS 14X20 (INSTRUMENTS) ×1 IMPLANT
MANIFOLD NEPTUNE II (INSTRUMENTS) ×2 IMPLANT
NDL SPNL 20GX3.5 QUINCKE YW (NEEDLE) ×2 IMPLANT
NEEDLE SPNL 20GX3.5 QUINCKE YW (NEEDLE) ×2 IMPLANT
PACK TOTAL KNEE (MISCELLANEOUS) ×1 IMPLANT
PAD ABD DERMACEA PRESS 5X9 (GAUZE/BANDAGES/DRESSINGS) ×2 IMPLANT
PAD ARMBOARD POSITIONER FOAM (MISCELLANEOUS) ×3 IMPLANT
PAD COLD UNI WRAP-ON (PAD) ×1 IMPLANT
PENCIL SMOKE EVACUATOR COATED (MISCELLANEOUS) ×1 IMPLANT
PIN DRILL FIX HALF THREAD (BIT) ×2 IMPLANT
PIN FIXATION 1/8DIA X 3INL (PIN) ×1 IMPLANT
SOL .9 NS 3000ML IRR UROMATIC (IV SOLUTION) ×1 IMPLANT
SOLN STERILE WATER 1000 ML (IV SOLUTION) ×1 IMPLANT
SOLN STERILE WATER BTL 1000 ML (IV SOLUTION) ×1 IMPLANT
SOLUTION IRRIG SURGIPHOR (IV SOLUTION) ×1 IMPLANT
SPONGE DRAIN TRACH 4X4 STRL 2S (GAUZE/BANDAGES/DRESSINGS) ×1 IMPLANT
STAPLER SKIN PROX 35W (STAPLE) ×1 IMPLANT
STOCKINETTE IMPERV 14X48 (MISCELLANEOUS) ×1 IMPLANT
STOCKINETTE STRL BIAS CUT 8X4 (MISCELLANEOUS) ×1 IMPLANT
STRAP TIBIA SHORT (MISCELLANEOUS) ×1 IMPLANT
SUCTION TUBE FRAZIER 10FR DISP (SUCTIONS) ×1 IMPLANT
SUT VIC AB 0 CT1 36 (SUTURE) ×1 IMPLANT
SUT VIC AB 1 CT1 36 (SUTURE) ×2 IMPLANT
SUT VIC AB 2-0 CT2 27 (SUTURE) ×1 IMPLANT
SYR 30ML LL (SYRINGE) ×2 IMPLANT
TIP FAN IRRIG PULSAVAC PLUS (DISPOSABLE) ×1 IMPLANT
TOWEL OR 17X26 4PK STRL BLUE (TOWEL DISPOSABLE) IMPLANT
TOWER CARTRIDGE SMART MIX (DISPOSABLE) ×1 IMPLANT
TRAP FLUID SMOKE EVACUATOR (MISCELLANEOUS) ×1 IMPLANT
TRAY FOLEY MTR SLVR 16FR STAT (SET/KITS/TRAYS/PACK) ×1 IMPLANT

## 2023-12-10 NOTE — Progress Notes (Signed)
 Patient is not able to walk the distance required to go the bathroom, or he/she is unable to safely negotiate stairs required to access the bathroom.  A 3in1 BSC will alleviate this problem   Amenda Duclos P. Angie Fava M.D.

## 2023-12-10 NOTE — Evaluation (Signed)
 Physical Therapy Evaluation Patient Details Name: Taylor [name] MRN: [id number] DOB: 11-Nov-1960 Today's Date: 12/10/2023  History of Present Illness  Pt admitted for L TKR and is POD 0 at time of evaluation.  Clinical Impression  Pt is a pleasant 63 year old female who was admitted for L TKR and is POD 0. Pt performs bed mobility with min assist, transfers with mod assist +2, and ambulation with min assist and RW. Pt demonstrates deficits with strength/mobility/pain. Pt demonstrates ability to perform 10 SLRs with independence, therefore does not require KI for mobility. Would benefit from skilled PT to address above deficits and promote optimal return to PLOF. Pt will continue to receive skilled PT services while admitted and will defer to TOC/care team for updates regarding disposition planning.       If plan is discharge home, recommend the following: A little help with walking and/or transfers;Assist for transportation   Can travel by private vehicle        Equipment Recommendations BSC/3in1  Recommendations for Other Services       Functional Status Assessment Patient has had a recent decline in their functional status and demonstrates the ability to make significant improvements in function in a reasonable and predictable amount of time.     Precautions / Restrictions Precautions Precautions: Fall;Knee Precaution Booklet Issued: No Restrictions Weight Bearing Restrictions Per Provider Order: Yes LLE Weight Bearing Per Provider Order: Weight bearing as tolerated      Mobility  Bed Mobility Overal bed mobility: Needs Assistance Bed Mobility: Supine to Sit     Supine to sit: Min assist     General bed mobility comments: needs assist for surgical leg. Once seated at EOB, upright posture noted    Transfers Overall transfer level: Needs assistance Equipment used: Rolling walker (2 wheels) Transfers: Sit to/from Stand Sit to Stand: Mod assist, +2 physical  assistance           General transfer comment: takes 3 attempts to stand, eventually asking for +2 due to pain/weakness. Once standing, able to weight shift and practice pre gait activities. Further transfers performed with min assist +1    Ambulation/Gait Ambulation/Gait assistance: Min assist Gait Distance (Feet): 100 Feet Assistive device: Rolling walker (2 wheels) Gait Pattern/deviations: Step-to pattern       General Gait Details: ambulated with min assist progressing to CGA. step to antalgic gait pattern noted with decreased knee flexion during swing phase  Stairs            Wheelchair Mobility     Tilt Bed    Modified Rankin (Stroke Patients Only)       Balance Overall balance assessment: Needs assistance Sitting-balance support: Feet supported Sitting balance-Leahy Scale: Good     Standing balance support: Bilateral upper extremity supported Standing balance-Leahy Scale: Fair                               Pertinent Vitals/Pain Pain Assessment Pain Assessment: 0-10 Pain Score: 8  Pain Location: ant knee Pain Descriptors / Indicators: Operative site guarding Pain Intervention(s): Limited activity within patient's tolerance, Repositioned, Ice applied    Home Living Family/patient expects to be discharged to:: Private residence Living Arrangements: Children Available Help at Discharge: Family;Available 24 hours/day Type of Home: House Home Access: Ramped entrance       Home Layout: One level Home Equipment: Agricultural consultant (2 wheels) Additional Comments: reports son lives with her and will  assist once home    Prior Function Prior Level of Function : Independent/Modified Independent             Mobility Comments: indep prior, reports no falls. 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 ADLs Comments: indep     Extremity/Trunk Assessment   Upper Extremity Assessment Upper Extremity Assessment: Generalized weakness (B UE grossly  4/5- chronic shoulder issues)    Lower Extremity Assessment Lower Extremity Assessment: Generalized weakness (L LE grossly 3/5; R LE grossly 3+/5; severe B feet neuropathy)       Communication   Communication Communication: No apparent difficulties    Cognition Arousal: Alert Behavior During Therapy: WFL for tasks assessed/performed   PT - Cognitive impairments: No apparent impairments                       PT - Cognition Comments: pleasant, slightly sarcastic Following commands: Intact       Cueing Cueing Techniques: Verbal cues     General Comments      Exercises Total Joint Exercises Goniometric ROM: L knee AAROM: 0-58 degrees; limited by pain Other Exercises Other Exercises: Ambulated to bathroom to void, min assist for transfers and cues/supervision for hygiene Other Exercises: O2 monitored throughout exertion on RA ranging from 88-91%. Cues for breathing   Assessment/Plan    PT Assessment Patient needs continued PT services  PT Problem List Decreased strength;Decreased balance;Decreased mobility;Pain       PT Treatment Interventions DME instruction;Gait training;Therapeutic exercise;Balance training    PT Goals (Current goals can be found in the Care Plan section)  Acute Rehab PT Goals Patient Stated Goal: to go home PT Goal Formulation: With patient Time For Goal Achievement: 12/24/23 Potential to Achieve Goals: Good    Frequency BID     Co-evaluation               AM-PAC PT 6 Clicks Mobility  Outcome Measure Help needed turning from your back to your side while in a flat bed without using bedrails?: A Little Help needed moving from lying on your back to sitting on the side of a flat bed without using bedrails?: A Little Help needed moving to and from a bed to a chair (including a wheelchair)?: A Lot Help needed standing up from a chair using your arms (e.g., wheelchair or bedside chair)?: A Lot Help needed to walk in hospital  room?: A Lot Help needed climbing 3-5 steps with a railing? : A Lot 6 Click Score: 14    End of Session Equipment Utilized During Treatment: Gait belt Activity Tolerance: Patient tolerated treatment well Patient left: in chair;with call bell/phone within reach Nurse Communication: Mobility status PT Visit Diagnosis: Muscle weakness (generalized) (M62.81);Difficulty in walking, not elsewhere classified (R26.2);Pain Pain - Right/Left: Left Pain - part of body: Knee    Time: 8481-8442 PT Time Calculation (min) (ACUTE ONLY): 39 min   Charges:   PT Evaluation $PT Eval Low Complexity: 1 Low PT Treatments $Gait Training: 8-22 mins $Therapeutic Activity: 8-22 mins PT General Charges $$ ACUTE PT VISIT: 1 Visit         Corean Dade, PT, DPT, GCS [phone]   Norita Meigs 12/10/2023, 4:44 PM

## 2023-12-10 NOTE — Anesthesia Preprocedure Evaluation (Signed)
 Anesthesia Evaluation  Patient identified by MRN, date of birth, ID band Patient awake    Reviewed: Allergy & Precautions, NPO status , Patient's Chart, lab work & pertinent test results  History of Anesthesia Complications Negative for: history of anesthetic complications  Airway Mallampati: IV  TM Distance: >3 FB Neck ROM: full    Dental  (+) Upper Dentures, Dental Advidsory Given   Pulmonary neg shortness of breath, sleep apnea , COPD, neg recent URI, former smoker   Pulmonary exam normal        Cardiovascular hypertension, On Medications (-) angina + CAD and + Peripheral Vascular Disease  (-) Past MI and (-) Cardiac Stents Normal cardiovascular exam(-) dysrhythmias (-) Valvular Problems/Murmurs     Neuro/Psych  Headaches, neg Seizures PSYCHIATRIC DISORDERS Anxiety Depression     Neuromuscular disease    GI/Hepatic negative GI ROS, Neg liver ROS,,,  Endo/Other  diabetes, Type 2Hypothyroidism  Class 3 obesity  Renal/GU negative Renal ROS  negative genitourinary   Musculoskeletal   Abdominal   Peds  Hematology  (+) Blood dyscrasia, anemia   Anesthesia Other Findings Past Medical History: No date: Anemia No date: Anxiety     Comment:  a.) on BZO (clonazepam) PRN No date: Aortic atherosclerosis (HCC) No date: Arthritis No date: B12 deficiency No date: Bilateral carotid artery disease (HCC) No date: Chronic back pain No date: Coronary artery calcification seen on CT scan No date: Depression 09/01/2019: Diastolic dysfunction     Comment:  a.) TTE 09/01/2019: >55%; mild LA enlargement; triv PR,               mild MR/TR; G1DD. No date: Dyspnea No date: High cholesterol No date: Hypertension No date: Hypothyroidism No date: Long term current use of antithrombotics/antiplatelets     Comment:  a.) clopidogrel No date: Long term current use of immunosuppressive drug     Comment:  a.) on Ixekizumab (Taltz) for  psoriatic athritis No date: Migraines 01/01/2021: Monoclonal gammopathy of unknown significance (MGUS)     Comment:  a.) IgG lambda MGUS No date: OSA (obstructive sleep apnea)     Comment:  a.) does not require nocturnal PAP therapy No date: Pneumonia No date: Psoriatic arthritis (HCC)     Comment:  a.) on Ixekizumab (Taltz) 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 No date: T2DM (type 2 diabetes mellitus) (HCC)  Past Surgical History: No date: ABDOMINAL HYSTERECTOMY No date: BACK SURGERY     Comment:  2 level fusion 08/23/2013: BREAST BIOPSY; Left     Comment:  stereo, negative No date: BUNIONECTOMY; Bilateral No date: carpal tunnel; Right No date: COLONOSCOPY WITH ESOPHAGOGASTRODUODENOSCOPY (EGD) No date: DILATION AND CURETTAGE OF UTERUS 05/16/2021: KNEE ARTHROSCOPY; Left     Comment:  Procedure: ARTHROSCOPY KNEE;  Surgeon: Mardee Lynwood SQUIBB,               MD;  Location: [facility];  Service: Orthopedics;                Laterality: Left; 02/14/2022: SHOULDER ARTHROSCOPY WITH SUBACROMIAL DECOMPRESSION,  ROTATOR CUFF REPAIR AND BICEP TENDON REPAIR; Left     Comment:  Procedure: SHOULDER ARTHROSCOPY WITH DEBRIDEMENT,               DECOMPRESSION, ROTATOR CUFF REPAIR AND BICEPS TENODESIS.;              Surgeon: Edie Norleen PARAS, MD;  Location: [facility];                Service: Orthopedics;  Laterality: Left; No date: tendinitis; Right     Comment:  wrist No date: TRIGGER FINGER RELEASE; Right     Comment:  x 2 thumb and middle finger  BMI    Body Mass Index: 40.60 kg/m      Reproductive/Obstetrics negative OB ROS                              Anesthesia Physical Anesthesia Plan  ASA: 3  Anesthesia Plan: Spinal   Post-op Pain Management: Minimal or no pain anticipated   Induction: Intravenous  PONV Risk Score and Plan: 2 and Propofol  infusion and TIVA  Airway Management Planned: Natural Airway and Simple Face Mask  Additional Equipment:   Intra-op  Plan:   Post-operative Plan:   Informed Consent: I have reviewed the patients History and Physical, chart, labs and discussed the procedure including the risks, benefits and alternatives for the proposed anesthesia with the patient or authorized representative who has indicated his/her understanding and acceptance.     Dental Advisory Given  Plan Discussed with: Anesthesiologist, CRNA and Surgeon  Anesthesia Plan Comments: (Patient consented for risks of anesthesia including but not limited to:  - adverse reactions to medications - risk of airway placement if required - damage to eyes, teeth, lips or other oral mucosa - nerve damage due to positioning  - sore throat or hoarseness - Damage to heart, brain, nerves, lungs, other parts of body or loss of life  Patient voiced understanding and assent.)         Anesthesia Quick Evaluation

## 2023-12-10 NOTE — Op Note (Signed)
 OPERATIVE NOTE  DATE OF SURGERY:  12/10/2023  PATIENT NAME:  Taylor [name]   DOB: [birth year]  MRN: [id number]  PRE-OPERATIVE DIAGNOSIS: Degenerative arthrosis of the left knee, primary  POST-OPERATIVE DIAGNOSIS:  Same  PROCEDURE:  Left total knee arthroplasty using computer-assisted navigation  SURGEON:  Lynwood SHAUNNA Mardee Mickey. M.D.  ASSISTANT:  Sidra Koyanagi, PA-C (present and scrubbed throughout the case, critical for assistance with exposure, retraction, instrumentation, and closure)  ANESTHESIA: spinal  ESTIMATED BLOOD LOSS: 50 mL  FLUIDS REPLACED: 600 mL of crystalloid  TOURNIQUET TIME: 87 minutes  DRAINS: 2 medium Hemovac drains  SOFT TISSUE RELEASES: Anterior cruciate ligament, posterior cruciate ligament, deep medial collateral ligament, patellofemoral ligament  IMPLANTS UTILIZED: DePuy Attune size 5 posterior stabilized femoral component (cemented), size 4 rotating platform tibial component (cemented), 32 mm medialized dome patella (cemented), and a 5 mm stabilized rotating platform polyethylene insert.  INDICATIONS FOR SURGERY: Taylor [name] is a 63 y.o. year old female with a long history of progressive knee pain. X-rays demonstrated severe degenerative changes in tricompartmental fashion. The patient had not seen any significant improvement despite conservative nonsurgical intervention. After discussion of the risks and benefits of surgical intervention, the patient expressed understanding of the risks benefits and agree with plans for total knee arthroplasty.   The risks, benefits, and alternatives were discussed at length including but not limited to the risks of infection, bleeding, nerve injury, stiffness, blood clots, the need for revision surgery, cardiopulmonary complications, among others, and they were willing to proceed.  PROCEDURE IN DETAIL: The patient was brought into the operating room and, after adequate spinal anesthesia was achieved, a tourniquet was placed  on the patient's upper thigh. The patient's knee and leg were cleaned and prepped with alcohol and DuraPrep and draped in the usual sterile fashion. A timeout was performed as per usual protocol. The lower extremity was exsanguinated using an Esmarch, and the tourniquet was inflated to 300 mmHg. An anterior longitudinal incision was made followed by a standard mid vastus approach. The deep fibers of the medial collateral ligament were elevated in a subperiosteal fashion off of the medial flare of the tibia so as to maintain a continuous soft tissue sleeve. The patella was subluxed laterally and the patellofemoral ligament was incised. Inspection of the knee demonstrated severe degenerative changes with full-thickness loss of articular cartilage. Osteophytes were debrided using a rongeur. Anterior and posterior cruciate ligaments were excised. Two 4.0 mm Schanz pins were inserted in the femur and into the tibia for attachment of the array of trackers used for computer-assisted navigation. Hip center was identified using a circumduction technique. Distal landmarks were mapped using the computer. The distal femur and proximal tibia were mapped using the computer. The distal femoral cutting guide was positioned using computer-assisted navigation so as to achieve a 5 distal valgus cut. The femur was sized and it was felt that a size 5 femoral component was appropriate. A size 5 femoral cutting guide was positioned and the anterior cut was performed and verified using the computer. This was followed by completion of the posterior and chamfer cuts. Femoral cutting guide for the central box was then positioned in the center box cut was performed.  Attention was then directed to the proximal tibia. Medial and lateral menisci were excised. The extramedullary tibial cutting guide was positioned using computer-assisted navigation so as to achieve a 0 varus-valgus alignment and 3 posterior slope. The cut was performed and  verified using the computer. The proximal tibia  was sized and it was felt that a size 4 tibial tray was appropriate. Tibial and femoral trials were inserted followed by insertion of a 5 mm polyethylene insert. This allowed for excellent mediolateral soft tissue balancing both in flexion and in full extension. Finally, the patella was cut and prepared so as to accommodate a 32 mm medialized dome patella. A patella trial was placed and the knee was placed through a range of motion with excellent patellar tracking appreciated. The femoral trial was removed after debridement of posterior osteophytes. The central post-hole for the tibial component was reamed followed by insertion of a keel punch. Tibial trials were then removed. Cut surfaces of bone were irrigated with copious amounts of normal saline using pulsatile lavage and then suctioned dry. Polymethylmethacrylate cement with gentamicin was prepared in the usual fashion using a vacuum mixer. Cement was applied to the cut surface of the proximal tibia as well as along the undersurface of a size 4 rotating platform tibial component. Tibial component was positioned and impacted into place. Excess cement was removed using Personal assistant. Cement was then applied to the cut surfaces of the femur as well as along the posterior flanges of the size 5 femoral component. The femoral component was positioned and impacted into place. Excess cement was removed using Personal assistant. A 5 mm polyethylene trial was inserted and the knee was brought into full extension with steady axial compression applied. Finally, cement was applied to the backside of a 32 mm medialized dome patella and the patellar component was positioned and patellar clamp applied. Excess cement was removed using Personal assistant. After adequate curing of the cement, the tourniquet was deflated after a total tourniquet time of 87 minutes. Hemostasis was achieved using electrocautery. The knee was irrigated with  copious amounts of normal saline using pulsatile lavage followed by 450 ml of Surgiphor and then suctioned dry. 20 mL of 1.3% Exparel  and 60 mL of 0.25% Marcaine  in 40 mL of normal saline was injected along the posterior capsule, medial and lateral gutters, and along the arthrotomy site. A 5 mm stabilized rotating platform polyethylene insert was inserted and the knee was placed through a range of motion with excellent mediolateral soft tissue balancing appreciated and excellent patellar tracking noted. 2 medium drains were placed in the wound bed and brought out through separate stab incisions. The medial parapatellar portion of the incision was reapproximated using interrupted sutures of #1 Vicryl. Subcutaneous tissue was approximated in layers using first #0 Vicryl followed #2-0 Vicryl. The skin was approximated with skin staples. A sterile dressing was applied.  The patient tolerated the procedure well and was transported to the recovery room in stable condition.    Chandlar Guice P. Alexander Mcauley, Jr., M.D.

## 2023-12-10 NOTE — Transfer of Care (Signed)
 Immediate Anesthesia Transfer of Care Note  Patient: Taylor [name]  Procedure(s) Performed: ARTHROPLASTY, KNEE, TOTAL, USING IMAGELESS COMPUTER-ASSISTED NAVIGATION (Left: Knee)  Patient Location: PACU  Anesthesia Type:Spinal  Level of Consciousness: awake and drowsy  Airway & Oxygen Therapy: Patient Spontanous Breathing and Patient connected to face mask oxygen  Post-op Assessment: Report given to RN and Post -op Vital signs reviewed and stable  Post vital signs: Reviewed and stable  Last Vitals:  Vitals Value Taken Time  BP 118/72 12/10/23 12:07  Temp    Pulse 86 12/10/23 12:08  Resp 19 12/10/23 12:09  SpO2 94 % 12/10/23 12:08  Vitals shown include unfiled device data.  Last Pain:  Vitals:   12/10/23 0714  PainSc: 7          Complications: No notable events documented.

## 2023-12-10 NOTE — Plan of Care (Signed)
   Problem: Pain Management: Goal: Pain level will decrease with appropriate interventions Outcome: Progressing

## 2023-12-10 NOTE — Interval H&P Note (Signed)
 History and Physical Interval Note:  12/10/2023 8:22 AM  Taylor [name]  has presented today for surgery, with the diagnosis of Primary osteoarthritis of left knee M17.12.  The various methods of treatment have been discussed with the patient and family. After consideration of risks, benefits and other options for treatment, the patient has consented to  Procedure(s): ARTHROPLASTY, KNEE, TOTAL, USING IMAGELESS COMPUTER-ASSISTED NAVIGATION (Left) as a surgical intervention.  The patient's history has been reviewed, patient examined, no change in status, stable for surgery.  I have reviewed the patient's chart and labs.  Questions were answered to the patient's satisfaction.     Dulse Rutan P Camdan Burdi

## 2023-12-10 NOTE — TOC Initial Note (Addendum)
 Transition of Care [facility]) - Initial/Assessment Note    Patient Details  Name: Taylor [name] MRN: [id number] Date of Birth: 1960-05-21  Transition of Care [facility]) CM/SW Contact:    [place] L Ifeanyi Mickelson, LCSW Phone Number: 12/10/2023, 4:07 PM  Clinical Narrative:                  [facility] ordered for patient. [employer] will deliver DME to PO[facility].        Patient Goals and CMS Choice            Expected Discharge Plan and Services                                              Prior Living Arrangements/Services                       Activities of Daily Living   ADL Screening (condition at time of admission) Independently performs ADLs?: Yes (appropriate for developmental age) Is the patient deaf or have difficulty hearing?: No Does the patient have difficulty seeing, even when wearing glasses/contacts?: No Does the patient have difficulty concentrating, remembering, or making decisions?: No  Permission Sought/Granted                  Emotional Assessment              Admission diagnosis:  Primary osteoarthritis of left knee [M17.12] History of total knee arthroplasty, left [Z96.652] Patient Active Problem List   Diagnosis Date Noted   History of total knee arthroplasty, left 12/10/2023   Primary osteoarthritis of left knee 08/24/2023   Chronic obstructive pulmonary disease (HCC) 07/30/2023   Rotator cuff tendinitis, left 01/11/2022   Peripheral arterial disease 04/29/2021   Coronary artery disease 03/20/2020   Hypothyroidism, acquired 11/24/2019   Bilateral carotid artery stenosis 09/09/2019   Hypoxia 06/27/2019   Hyperlipidemia 06/27/2019   Sinusitis 06/27/2019   OSA (obstructive sleep apnea) 06/27/2019   Acute respiratory failure with hypoxia (HCC)    Shortness of breath    Bronchospasm with bronchitis, acute    Type II diabetes mellitus with complication (HCC) 01/26/2019   Chronic gastritis 08/31/2018   B12 deficiency 04/07/2018   Cramps,  extremity 12/11/2017   Primary osteoarthritis of right knee 12/11/2017   Right anterior knee pain 08/13/2017   Chronic pain of left ankle 05/13/2017   PSVT (paroxysmal supraventricular tachycardia) 01/13/2017   Degenerative disc disease, lumbar 01/07/2017   Morbid obesity with BMI of 40.0-44.9, adult (HCC) 05/24/2016   Encounter for long-term (current) use of high-risk medication 02/08/2016   Plaque psoriasis 01/01/2016   Psoriatic arthritis (HCC) 01/01/2016   Hyperglycemia 08/17/2014   Anemia, iron deficiency 07/14/2013   Esophageal dysphagia 07/14/2013   HTN (hypertension) 07/14/2013   Migraine headache 07/14/2013   PCP:  Auston Reyes BIRCH, MD Pharmacy:   [facility] [phone] [address], [place] - [address] AT [facility] OF [address] [place] [address] [place] [address] Phone: [phone] Fax: [phone]     Social Drivers of Health (SDOH) Social History: SDOH Screenings   Food Insecurity: No Food Insecurity (12/10/2023)  Housing: High Risk (12/10/2023)  Transportation Needs: No Transportation Needs (12/10/2023)  Utilities: At Risk (12/10/2023)  Financial Resource Strain: Low Risk  (12/04/2023)   Received from [facility]  Tobacco Use:  Medium Risk (12/10/2023)   SDOH Interventions:     Readmission Risk Interventions     No data to display

## 2023-12-10 NOTE — Progress Notes (Signed)
 Pharmacist Note  Patient with Allergy to Aspirin with reaction listed as Hives, Swelling  Secure chat with Dr. Mardee to verify if still want to continue with aspirin that is ordered.  MD response:  I spoke with the patient this afternoon. The reaction was many years ago. She is willing to try the aspirin with monitoring.    Allean Haas PharmD Clinical Pharmacist 12/10/2023

## 2023-12-10 NOTE — Anesthesia Procedure Notes (Signed)
 Spinal  Patient location during procedure: OR Start time: 12/10/2023 8:35 AM End time: 12/10/2023 8:40 AM Reason for block: surgical anesthesia Staffing Performed: resident/CRNA  Resident/CRNA: Trudy Rankin LABOR, CRNA Performed by: Trudy Rankin LABOR, CRNA Authorized by: Dario Barter, MD   Preanesthetic Checklist Completed: patient identified, IV checked, site marked, risks and benefits discussed, surgical consent, monitors and equipment checked, pre-op evaluation and timeout performed Spinal Block Patient position: sitting Prep: Betadine Patient monitoring: heart rate, continuous pulse ox and blood pressure Approach: midline Location: L3-4 Injection technique: single-shot Needle Needle type: Introducer and Pencan  Needle gauge: 24 G Needle length: 9 cm Assessment Sensory level: T4 Events: CSF return Additional Notes Negative paresthesia. Negative blood return. Positive free-flowing CSF. Expiration date of kit checked and confirmed. Patient tolerated procedure well, without complications.

## 2023-12-11 ENCOUNTER — Encounter: Payer: Self-pay | Admitting: Orthopedic Surgery

## 2023-12-11 ENCOUNTER — Other Ambulatory Visit: Payer: Self-pay

## 2023-12-11 DIAGNOSIS — M1712 Unilateral primary osteoarthritis, left knee: Secondary | ICD-10-CM | POA: Diagnosis not present

## 2023-12-11 LAB — GLUCOSE, CAPILLARY: Glucose-Capillary: 162 mg/dL — ABNORMAL HIGH (ref 70–99)

## 2023-12-11 MED ORDER — OXYCODONE HCL 5 MG PO TABS
5.0000 mg | ORAL_TABLET | ORAL | 0 refills | Status: DC | PRN
  Filled 2023-12-11: qty 30, 5d supply, fill #0

## 2023-12-11 MED ORDER — BISOPROLOL FUMARATE 5 MG PO TABS
10.0000 mg | ORAL_TABLET | Freq: Every day | ORAL | Status: DC
  Administered 2023-12-11: 10 mg via ORAL
  Filled 2023-12-11: qty 2

## 2023-12-11 MED ORDER — CELECOXIB 200 MG PO CAPS
200.0000 mg | ORAL_CAPSULE | Freq: Two times a day (BID) | ORAL | 1 refills | Status: DC
  Filled 2023-12-11: qty 60, 30d supply, fill #0

## 2023-12-11 MED ORDER — TRAMADOL HCL 50 MG PO TABS
100.0000 mg | ORAL_TABLET | Freq: Two times a day (BID) | ORAL | 0 refills | Status: AC | PRN
  Filled 2023-12-11: qty 30, 8d supply, fill #0

## 2023-12-11 MED ORDER — HYDROCHLOROTHIAZIDE 12.5 MG PO TABS
6.2500 mg | ORAL_TABLET | Freq: Every day | ORAL | Status: DC
  Administered 2023-12-11: 6.25 mg via ORAL
  Filled 2023-12-11: qty 1

## 2023-12-11 MED ORDER — ASPIRIN 81 MG PO CHEW: 81.0000 mg | CHEWABLE_TABLET | Freq: Two times a day (BID) | ORAL | Status: DC

## 2023-12-11 NOTE — Progress Notes (Signed)
 DISCHARGE NOTE:   Pt discharged with IV removed and dc instructions given. Pt has both TED hose on and in place. Pt educated on bone foam and no pillows under the knee. Pt voices no questions or concerns at this time. Pt received 3 in 1 and medications delivered to hospital room. Pt wheeled down to medical mall entrance by staff and pt's son provided transportation.

## 2023-12-11 NOTE — Care Management Obs Status (Signed)
 MEDICARE OBSERVATION STATUS NOTIFICATION   Patient Details  Name: Taylor [name] MRN: [id number] Date of Birth: 04-24-1960   Medicare Observation Status Notification Given:  Other (see comment)    [name] 12/11/2023, 11:31 AM

## 2023-12-11 NOTE — Progress Notes (Signed)
 Physical Therapy Treatment Patient Details Name: SERETHA ESTABROOKS MRN: [id number] DOB: 05-Nov-1960 Today's Date: 12/11/2023   History of Present Illness Pt admitted for L TKR and is POD 0 at time of evaluation.    PT Comments  Pt is making good progress towards goals with ability to ambulate with improved gait mechanics and less pain this date. Pt eager to dc home. ROM improved and reviewed written HEP. Left with ice applied and towel under ankle. Will plan to dc at this time.    If plan is discharge home, recommend the following: A little help with walking and/or transfers;Assist for transportation   Can travel by private vehicle        Equipment Recommendations  BSC/3in1    Recommendations for Other Services       Precautions / Restrictions Precautions Precautions: Fall;Knee Precaution Booklet Issued: Yes (comment) Recall of Precautions/Restrictions: Intact Restrictions Weight Bearing Restrictions Per Provider Order: Yes LLE Weight Bearing Per Provider Order: Weight bearing as tolerated     Mobility  Bed Mobility Overal bed mobility: Needs Assistance Bed Mobility: Supine to Sit     Supine to sit: Contact guard     General bed mobility comments: needs assist for surgical leg. Once seated, able to sit at EOB with upright posture    Transfers Overall transfer level: Needs assistance Equipment used: Rolling walker (2 wheels) Transfers: Sit to/from Stand Sit to Stand: Supervision           General transfer comment: improved from previous session. Safe technique performed. Tends to pull up on RW vs pushing from seated surface    Ambulation/Gait Ambulation/Gait assistance: Supervision Gait Distance (Feet): 200 Feet Assistive device: Rolling walker (2 wheels) Gait Pattern/deviations: Step-through pattern       General Gait Details: ambulated in hallway with reciprocal gait. Able to demonstrate upright posture and carry conversation with exertion. Limited knee  flexion noted during swing phase   Stairs Stairs: Yes Stairs assistance: Supervision Stair Management: Two rails, Step to pattern Number of Stairs: 4 General stair comments: up/down with safe technique. No stairs at home   Wheelchair Mobility     Tilt Bed    Modified Rankin (Stroke Patients Only)       Balance Overall balance assessment: Needs assistance Sitting-balance support: Feet supported Sitting balance-Leahy Scale: Good     Standing balance support: Bilateral upper extremity supported Standing balance-Leahy Scale: Good                              Communication Communication Communication: No apparent difficulties  Cognition Arousal: Alert Behavior During Therapy: WFL for tasks assessed/performed   PT - Cognitive impairments: No apparent impairments                       PT - Cognition Comments: pleasant, slightly sarcastic Following commands: Intact      Cueing Cueing Techniques: Verbal cues  Exercises Total Joint Exercises Goniometric ROM: L knee AAROM: 0-78 degrees Other Exercises Other Exercises: reviewed written HEP, performed 5-8 reps on L LE. Reviewed towel roll placement    General Comments        Pertinent Vitals/Pain Pain Assessment Pain Assessment: 0-10 Pain Score: 3  Pain Location: ant knee Pain Descriptors / Indicators: Operative site guarding Pain Intervention(s): Limited activity within patient's tolerance, Premedicated before session, Repositioned, Ice applied    Home Living  Prior Function            PT Goals (current goals can now be found in the care plan section) Acute Rehab PT Goals Patient Stated Goal: to go home PT Goal Formulation: With patient Time For Goal Achievement: 12/24/23 Potential to Achieve Goals: Good Progress towards PT goals: Progressing toward goals    Frequency    BID      PT Plan      Co-evaluation              AM-PAC PT  6 Clicks Mobility   Outcome Measure  Help needed turning from your back to your side while in a flat bed without using bedrails?: None Help needed moving from lying on your back to sitting on the side of a flat bed without using bedrails?: A Little Help needed moving to and from a bed to a chair (including a wheelchair)?: A Little Help needed standing up from a chair using your arms (e.g., wheelchair or bedside chair)?: A Little Help needed to walk in hospital room?: A Little Help needed climbing 3-5 steps with a railing? : A Little 6 Click Score: 19    End of Session Equipment Utilized During Treatment: Gait belt Activity Tolerance: Patient tolerated treatment well Patient left: in chair;with call bell/phone within reach Nurse Communication: Mobility status PT Visit Diagnosis: Muscle weakness (generalized) (M62.81);Difficulty in walking, not elsewhere classified (R26.2);Pain Pain - Right/Left: Left Pain - part of body: Knee     Time: 9093-9063 PT Time Calculation (min) (ACUTE ONLY): 30 min  Charges:    $Gait Training: 8-22 mins $Therapeutic Exercise: 8-22 mins PT General Charges $$ ACUTE PT VISIT: 1 Visit                     Corean Dade, PT, DPT, GCS [phone]    Aleksa Catterton 12/11/2023, 10:26 AM

## 2023-12-11 NOTE — Discharge Summary (Signed)
 Physician Discharge Summary  Subjective: 1 Day Post-Op Procedure(s) (LRB): ARTHROPLASTY, KNEE, TOTAL, USING IMAGELESS COMPUTER-ASSISTED NAVIGATION (Left) Patient reports pain as mild.   Patient seen in rounds with Dr. Mardee. Patient is well, and has had no acute complaints or problems Denies any CP, SOB, N/V, fevers or chills We will start therapy today.  Patient is ready to go home  Physician Discharge Summary  Patient ID: Taylor [name] MRN: [id number] DOB/AGE: 1960/11/01 63 y.o.  Admit date: 12/10/2023 Discharge date: 12/11/2023  Admission Diagnoses:  Discharge Diagnoses:  Principal Problem:   History of total knee arthroplasty, left   Discharged Condition: good  Hospital Course: Patient presented to the hospital on 12/10/2023 for an elective left total knee arthroplasty performed by Dr. Mardee. Patient was given 1g of TXA and 2g of Ancef prior to the procedure. she tolerated the procedure well without any complications. See procedural note below for details. Postoperatively, the patient did very well. she was able to pass PT protocols on post-op day one without any issues. JP drain was removed without any difficulty and was intact. she was able to void her bladder without any difficulty. Physical exam was unremarkable. she denies any SOB, CP, N/V, fevers or chills. Vital signs are stable. Patient is stable to discharge home.  PROCEDURE:  Left total knee arthroplasty using computer-assisted navigation   SURGEON:  Lynwood SHAUNNA Mardee Mickey. M.D.   ASSISTANT:  Sidra Koyanagi, PA-C (present and scrubbed throughout the case, critical for assistance with exposure, retraction, instrumentation, and closure)   ANESTHESIA: spinal   ESTIMATED BLOOD LOSS: 50 mL   FLUIDS REPLACED: 600 mL of crystalloid   TOURNIQUET TIME: 87 minutes   DRAINS: 2 medium Hemovac drains   SOFT TISSUE RELEASES: Anterior cruciate ligament, posterior cruciate ligament, deep medial collateral ligament,  patellofemoral ligament   IMPLANTS UTILIZED: DePuy Attune size 5 posterior stabilized femoral component (cemented), size 4 rotating platform tibial component (cemented), 32 mm medialized dome patella (cemented), and a 5 mm stabilized rotating platform polyethylene insert.    Treatments: Diflucan  Discharge Exam: Blood pressure 135/72, pulse 76, temperature 98.5 F (36.9 C), temperature source Oral, resp. rate 15, height 5' 5 (1.651 m), weight 113.7 kg, SpO2 90%.   Disposition: home   Allergies as of 12/11/2023       Reactions   Aspirin Hives, Swelling   Penicillins Hives   IgE = 168 (WNL) on 12/03/2023        Medication List     STOP taking these medications    naproxen sodium 220 MG tablet Commonly known as: ALEVE       TAKE these medications    Advair HFA 115-21 MCG/ACT inhaler Generic drug: fluticasone-salmeterol Inhale 2 puffs into the lungs 2 (two) times daily.   albuterol  108 (90 Base) MCG/ACT inhaler Commonly known as: VENTOLIN  HFA Inhale 1-2 puffs into the lungs every 6 (six) hours as needed for wheezing or shortness of breath.   aspirin 81 MG chewable tablet Chew 1 tablet (81 mg total) by mouth 2 (two) times daily.   bisoprolol-hydrochlorothiazide 10-6.25 MG tablet Commonly known as: ZIAC Take 1 tablet by mouth daily.   buPROPion 150 MG 24 hr tablet Commonly known as: WELLBUTRIN XL Take 150 mg by mouth at bedtime.   busPIRone 10 MG tablet Commonly known as: BUSPAR Take 10 mg by mouth 2 (two) times daily.   celecoxib  200 MG capsule Commonly known as: CELEBREX  Take 1 capsule (200 mg total) by mouth 2 (two) times daily.  clonazePAM 0.5 MG tablet Commonly known as: KLONOPIN Take 0.5 mg by mouth 2 (two) times daily as needed for anxiety.   clopidogrel 75 MG tablet Commonly known as: PLAVIX Take 75 mg by mouth daily.   cyanocobalamin 1000 MCG/ML injection Commonly known as: VITAMIN B12 Inject 1,000 mcg into the muscle every 30 (thirty)  days.   cyanocobalamin 1000 MCG tablet Commonly known as: VITAMIN B12 Take 1,000 mcg by mouth daily.   escitalopram 20 MG tablet Commonly known as: LEXAPRO Take 20 mg by mouth at bedtime.   glimepiride 4 MG tablet Commonly known as: AMARYL Take 4 mg by mouth in the morning and at bedtime.   levothyroxine 50 MCG tablet Commonly known as: SYNTHROID Take 75 mcg by mouth daily before breakfast.   nortriptyline 25 MG capsule Commonly known as: PAMELOR Take 25 mg by mouth at bedtime.   oxyCODONE  5 MG immediate release tablet Commonly known as: Oxy IR/ROXICODONE  Take 1 tablet (5 mg total) by mouth every 4 (four) hours as needed for moderate pain (pain score 4-6) (pain score 4-6).   pregabalin 200 MG capsule Commonly known as: LYRICA Take 200 mg by mouth 2 (two) times daily.   rosuvastatin 40 MG tablet Commonly known as: CRESTOR Take 40 mg by mouth daily.   Stelara 90 MG/ML Sosy injection Generic drug: ustekinumab Inject 90 mg into the skin every 8 (eight) weeks.   torsemide 20 MG tablet Commonly known as: DEMADEX Take 20 mg by mouth as needed.   traMADol 50 MG tablet Commonly known as: ULTRAM Take 2 tablets (100 mg total) by mouth 2 (two) times daily as needed for moderate pain (pain score 4-6).   Vitamin D (Ergocalciferol) 1.25 MG (50000 UNIT) Caps capsule Commonly known as: DRISDOL Take 50,000 Units by mouth every Sunday.               Durable Medical Equipment  (From admission, onward)           Start     Ordered   12/10/23 1436  DME Walker rolling  Once       Question:  Patient needs a walker to treat with the following condition  Answer:  Total knee replacement status   12/10/23 1435   12/10/23 1436  DME Bedside commode  Once       Comments: Patient is not able to walk the distance required to go the bathroom, or he/she is unable to safely negotiate stairs required to access the bathroom.  A 3in1 BSC will alleviate this problem  Question:  Patient  needs a bedside commode to treat with the following condition  Answer:  Total knee replacement status   12/10/23 1435            Follow-up Information     Drake Chew, PA-C Follow up on 12/25/2023.   Specialty: Orthopedic Surgery Why: at 9:15am Contact information: [address] [place] [address] [phone]         Mardee Lynwood SQUIBB, MD Follow up on 01/20/2024.   Specialty: Orthopedic Surgery Why: at 2:45pm Contact information: [address] [facility] [place] [address] [phone]                 Signed: Sidra Drake 12/11/2023, 8:28 AM   Objective: Vital signs in last 24 hours: Temp:  [97.8 F (36.6 C)-99 F (37.2 C)] 98.5 F (36.9 C) (10/02 0731) Pulse Rate:  [70-86] 76 (10/02 0731) Resp:  [13-29] 15 (10/02 0731)  BP: (101-135)/(48-86) 135/72 (10/02 0731) SpO2:  [89 %-96 %] 90 % (10/02 0731)  Intake/Output from previous day:  Intake/Output Summary (Last 24 hours) at 12/11/2023 0828 Last data filed at 12/10/2023 2100 Gross per 24 hour  Intake 920.37 ml  Output 625 ml  Net 295.37 ml    Intake/Output this shift: No intake/output data recorded.  Labs: No results for input(s): HGB in the last 72 hours. No results for input(s): WBC, RBC, HCT, PLT in the last 72 hours. No results for input(s): NA, K, CL, CO2, BUN, CREATININE, GLUCOSE, CALCIUM in the last 72 hours. No results for input(s): LABPT, INR in the last 72 hours.  EXAM: General - Patient is Alert, Appropriate, and Oriented Extremity - Neurologically intact Neurovascular intact Sensation intact distally Intact pulses distally Dorsiflexion/Plantar flexion intact No cellulitis present Compartment soft Dressing - dressing C/D/I and no drainage Motor Function - intact, moving foot and toes well on exam.  JP Drain pulled without difficulty. Intact  Assessment/Plan: 1 Day Post-Op Procedure(s) (LRB): ARTHROPLASTY, KNEE,  TOTAL, USING IMAGELESS COMPUTER-ASSISTED NAVIGATION (Left) Procedure(s) (LRB): ARTHROPLASTY, KNEE, TOTAL, USING IMAGELESS COMPUTER-ASSISTED NAVIGATION (Left) Past Medical History:  Diagnosis Date   Anemia    Anxiety    a.) on BZO (clonazepam) PRN   Aortic atherosclerosis    Arthritis    B12 deficiency    Bilateral carotid artery disease    Chronic back pain    COPD (chronic obstructive pulmonary disease) (HCC)    Coronary artery calcification seen on CT scan    DDD (degenerative disc disease), lumbar    DDD (degenerative disc disease), lumbar    a.) s/p L4-5 and L5-S1 fusion   Depression    Diastolic dysfunction 09/01/2019   a.) TTE 09/01/2019: >55%; mild LA enlargement; triv PR, mild MR/TR; G1DD.   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   Weight as of this encounter: 113.7 kg.  Patient will continue to work with physical therapy to pass postoperative PT protocols, ROM and strengthening   Discussed with the  patient continuing to utilize Polar Care   Patient will use bone foam in 20-30 minute intervals   Patient will wear TED hose bilaterally to help prevent DVT and clot formation   Discussed the Aquacel bandage.  This bandage will stay in place 7 days postoperatively.  Can be replaced with honeycomb bandages that  will be sent home with the patient   Discussed sending the patient home with tramadol and oxycodone  for as needed pain management.  Patient will also be sent home with Celebrex  to help with swelling and inflammation.  Patient will take an 81 mg aspirin twice daily for DVT prophylaxis   JP drain removed without difficulty, intact  Patient given diflucan per request for prophylaxis for yeast infection she states she gets regularly after antibiotics   Weight-Bearing as tolerated to left leg   Patient will follow-up with [facility] clinic orthopedics in 2 weeks for staple removal and reevaluation  Diet - Regular diet Follow up - in 2 weeks Activity - WBAT Disposition - Home Condition Upon Discharge - Good DVT Prophylaxis - Aspirin and TED hose  Fonda CHARLENA Koyanagi, PA-C Orthopaedic Surgery 12/11/2023, 8:28 AM

## 2023-12-11 NOTE — Plan of Care (Signed)
  Problem: Education: Goal: Ability to describe self-care measures that may prevent or decrease complications (Diabetes Survival Skills Education) will improve Outcome: Progressing   Problem: Coping: Goal: Ability to adjust to condition or change in health will improve Outcome: Progressing   Problem: Nutritional: Goal: Maintenance of adequate nutrition will improve Outcome: Progressing   Problem: Skin Integrity: Goal: Risk for impaired skin integrity will decrease Outcome: Progressing   

## 2023-12-11 NOTE — Progress Notes (Signed)
 Subjective: 1 Day Post-Op Procedure(s) (LRB): ARTHROPLASTY, KNEE, TOTAL, USING IMAGELESS COMPUTER-ASSISTED NAVIGATION (Left) Patient reports pain as mild.   Patient seen in rounds with Dr. Mardee. Patient is well, and has had no acute complaints or problems Denies any CP, SOB, N/V, fevers or chills We will start therapy today.  Plan is to go Home after hospital stay.  Objective: Vital signs in last 24 hours: Temp:  [97.8 F (36.6 C)-99 F (37.2 C)] 98.5 F (36.9 C) (10/02 0731) Pulse Rate:  [70-86] 76 (10/02 0731) Resp:  [13-29] 15 (10/02 0731) BP: (101-135)/(48-86) 135/72 (10/02 0731) SpO2:  [89 %-96 %] 90 % (10/02 0731)  Intake/Output from previous day:  Intake/Output Summary (Last 24 hours) at 12/11/2023 0756 Last data filed at 12/10/2023 2100 Gross per 24 hour  Intake 920.37 ml  Output 625 ml  Net 295.37 ml    Intake/Output this shift: No intake/output data recorded.  Labs: No results for input(s): HGB in the last 72 hours. No results for input(s): WBC, RBC, HCT, PLT in the last 72 hours. No results for input(s): NA, K, CL, CO2, BUN, CREATININE, GLUCOSE, CALCIUM in the last 72 hours. No results for input(s): LABPT, INR in the last 72 hours.  EXAM General - Patient is Alert, Appropriate, and Oriented Extremity - Neurologically intact Neurovascular intact Sensation intact distally Intact pulses distally Dorsiflexion/Plantar flexion intact No cellulitis present Compartment soft Dressing - dressing C/D/I and no drainage Motor Function - intact, moving foot and toes well on exam.  JP Drain pulled without difficulty. Intact  Past Medical History:  Diagnosis Date   Anemia    Anxiety    a.) on BZO (clonazepam) PRN   Aortic atherosclerosis    Arthritis    B12 deficiency    Bilateral carotid artery disease    Chronic back pain    COPD (chronic obstructive pulmonary disease) (HCC)    Coronary artery calcification seen on CT scan     DDD (degenerative disc disease), lumbar    DDD (degenerative disc disease), lumbar    a.) s/p L4-5 and L5-S1 fusion   Depression    Diastolic dysfunction 09/01/2019   a.) TTE 09/01/2019: >55%; mild LA enlargement; triv PR, mild MR/TR; G1DD.   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   Morbid obesity (HCC)    OSA (obstructive sleep apnea)    Has not picked up cpap yet as of 12-03-23 due to cost   Pneumonia    Psoriasis    Psoriatic arthritis (HCC)    a.) on ustekinumab (Stelara)   PSVT (paroxysmal supraventricular tachycardia)    Right carpal tunnel syndrome s/p release    Sensory polyneuropathy    T2DM (type 2 diabetes mellitus) (HCC)    Thyroid  nodule     Assessment/Plan: 1 Day Post-Op Procedure(s) (LRB): ARTHROPLASTY, KNEE, TOTAL, USING IMAGELESS COMPUTER-ASSISTED NAVIGATION (Left) Principal Problem:   History of total knee arthroplasty, left  Estimated body mass index is 41.71 kg/m as calculated from the following:   Height as of this encounter: 5' 5 (1.651 m).  Weight as of this encounter: 113.7 kg. Advance diet Up with therapy  Patient will continue to work with physical therapy to pass postoperative PT protocols, ROM and strengthening  Discussed with the patient continuing to utilize Polar Care  Patient will use bone foam in 20-30 minute intervals  Patient will wear TED hose bilaterally to help prevent DVT and clot formation  Discussed the Aquacel bandage.  This bandage will stay in place 7 days postoperatively.  Can be replaced with honeycomb bandages that will be  sent home with the patient  Discussed sending the patient home with tramadol and oxycodone  for as needed pain management.  Patient will also be sent home with Celebrex  to help with swelling and inflammation.  Patient will take an 81 mg aspirin twice daily for DVT prophylaxis  JP drain removed without difficulty, intact  Weight-Bearing as tolerated to left leg  Patient will follow-up with [facility] orthopedics in 2 weeks for staple removal and reevaluation  Fonda Koyanagi, PA-C [facility] 12/11/2023, 7:56 AM

## 2023-12-11 NOTE — Evaluation (Signed)
 Occupational Therapy Evaluation Patient Details Name: Taylor [name] MRN: [id number] DOB: 1961/01/30 Today's Date: 12/11/2023   History of Present Illness   Pt admitted for L TKR and is POD 0 at time of evaluation.     Clinical Impressions Pt seen for OT evaluation this date, POD#1 from above surgery. Pt was independent in all ADLs prior to surgery, however occasionally using RW for mobility due to L knee pain. Pt is eager to return to PLOF with less pain and improved safety and independence. Pt currently requires minimal assist for LB dressing while in seated position due to pain and limited AROM of L knee. Pt instructed in polar care mgt, falls prevention strategies, home/routines modifications, DME/AE for LB bathing and dressing tasks, and compression stocking mgt. Pt would benefit from skilled OT services including additional instruction in dressing techniques with or without assistive devices for dressing and bathing skills to support recall and carryover prior to discharge and ultimately to maximize safety, independence, and minimize falls risk and caregiver burden. Anticipate the need for follow up OT services upon acute hospital DC to assess and train on use of AE/DME for LB ADLs.      If plan is discharge home, recommend the following:   A little help with walking and/or transfers;A little help with bathing/dressing/bathroom;Assistance with cooking/housework;Assist for transportation     Functional Status Assessment   Patient has had a recent decline in their functional status and demonstrates the ability to make significant improvements in function in a reasonable and predictable amount of time.     Equipment Recommendations   BSC/3in1     Recommendations for Other Services         Precautions/Restrictions   Precautions Precautions: Fall;Knee Recall of Precautions/Restrictions: Intact Restrictions Weight Bearing Restrictions Per Provider Order: Yes LLE Weight  Bearing Per Provider Order: Weight bearing as tolerated     Mobility Bed Mobility               General bed mobility comments: Pt received in recliner at start/end of session.    Transfers Overall transfer level: Needs assistance Equipment used: Rolling walker (2 wheels) Transfers: Sit to/from Stand Sit to Stand: Supervision                  Balance Overall balance assessment: Needs assistance Sitting-balance support: Feet supported Sitting balance-Leahy Scale: Good     Standing balance support: Bilateral upper extremity supported Standing balance-Leahy Scale: Good Standing balance comment: Pt demonstrated posterior LOB x 1 when standing completing standing comonents of LB dressing tasks                           ADL either performed or assessed with clinical judgement   ADL Overall ADL's : Needs assistance/impaired                     Lower Body Dressing: Minimal assistance   Toilet Transfer: Contact guard assist Toilet Transfer Details (indicate cue type and reason): LOB x1 during simulation of toilet transfer/clothing management at [facility] Toileting- Clothing Manipulation and Hygiene: Contact guard assist Toileting - Clothing Manipulation Details (indicate cue type and reason): simulated             Vision Patient Visual Report: No change from baseline       Perception         Praxis         Pertinent Vitals/Pain Pain Assessment Pain  Assessment: 0-10 Pain Score: 2  Pain Location: ant knee Pain Descriptors / Indicators: Operative site guarding Pain Intervention(s): Monitored during session, Ice applied     Extremity/Trunk Assessment Upper Extremity Assessment Upper Extremity Assessment: Generalized weakness           Communication Communication Communication: No apparent difficulties   Cognition Arousal: Alert Behavior During Therapy: WFL for tasks assessed/performed                                  Following commands: Intact       Cueing  General Comments   Cueing Techniques: Verbal cues      Exercises Other Exercises Other Exercises: Education on role of OT in Acute, education on use of DME/AE for LB ADL task engagement and functional transfers, education on application of TED hose and polar care, handout provided.   Shoulder Instructions      Home Living Family/patient expects to be discharged to:: Private residence Living Arrangements: Children Available Help at Discharge: Family;Available 24 hours/day Type of Home: House Home Access: Ramped entrance     Home Layout: One level     Bathroom Shower/Tub: Producer, television/film/video: Standard Bathroom Accessibility: Yes How Accessible: Accessible via walker Home Equipment: Rolling Walker (2 wheels)   Additional Comments: reports son lives with her and will assist once home      Prior Functioning/Environment Prior Level of Function : Independent/Modified Independent             Mobility Comments: indep prior, reports no falls. 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 ADLs Comments: indep    OT Problem List: Decreased strength;Decreased activity tolerance;Decreased safety awareness;Decreased knowledge of use of DME or AE   OT Treatment/Interventions: Self-care/ADL training;Therapeutic activities;Patient/family education;DME and/or AE instruction;Balance training      OT Goals(Current goals can be found in the care plan section)   Acute Rehab OT Goals Patient Stated Goal: To return home ASAP OT Goal Formulation: With patient Time For Goal Achievement: 12/25/23 Potential to Achieve Goals: Good   OT Frequency:  Min 1X/week    Co-evaluation              AM-PAC OT 6 Clicks Daily Activity     Outcome Measure Help from another person eating meals?: None Help from another person taking care of personal grooming?: None Help from another person toileting, which includes using toliet, bedpan, or  urinal?: A Little Help from another person bathing (including washing, rinsing, drying)?: A Little Help from another person to put on and taking off regular upper body clothing?: None Help from another person to put on and taking off regular lower body clothing?: A Little 6 Click Score: 21   End of Session Equipment Utilized During Treatment: Rolling walker (2 wheels) Nurse Communication: Mobility status  Activity Tolerance: Patient tolerated treatment well Patient left: in chair;with call bell/phone within reach  OT Visit Diagnosis: Unsteadiness on feet (R26.81);Muscle weakness (generalized) (M62.81)                Time: 9052-8989 OT Time Calculation (min): 23 min Charges:  OT General Charges $OT Visit: 1 Visit OT Evaluation $OT Eval Low Complexity: 1 Low OT Treatments $Self Care/Home Management : 23-37 mins  Harlene Sharps OTR/L   Harlene LITTIE Sharps 12/11/2023, 12:03 PM

## 2023-12-11 NOTE — Care Management Obs Status (Signed)
 MEDICARE OBSERVATION STATUS NOTIFICATION   Patient Details  Name: CIANI RUTTEN MRN: [id number] Date of Birth: 11-Jun-1960   Medicare Observation Status Notification Given:   Was at patient's room at 11:00 am, patient was discharged.    [name] 12/11/2023, 11:29 AM

## 2023-12-11 NOTE — Anesthesia Postprocedure Evaluation (Signed)
 Anesthesia Post Note  Patient: Ashantae B Dolata  Procedure(s) Performed: ARTHROPLASTY, KNEE, TOTAL, USING IMAGELESS COMPUTER-ASSISTED NAVIGATION (Left: Knee)  Patient location during evaluation: Nursing Unit Anesthesia Type: Spinal Level of consciousness: awake and alert and oriented Pain management: pain level controlled Vital Signs Assessment: post-procedure vital signs reviewed and stable Respiratory status: spontaneous breathing and nonlabored ventilation Cardiovascular status: blood pressure returned to baseline Postop Assessment: no headache, no backache, no apparent nausea or vomiting, adequate PO intake and able to ambulate Anesthetic complications: no   No notable events documented.   Last Vitals:  Vitals:   12/10/23 2000 12/11/23 0515  BP: 124/62 120/73  Pulse: 76 74  Resp: 16 16  Temp: 36.7 C 36.6 C  SpO2: 94% 93%    Last Pain:  Vitals:   12/11/23 0515  TempSrc:   PainSc: 4                  Dugan Vanhoesen D Lalaine Overstreet

## 2023-12-17 ENCOUNTER — Other Ambulatory Visit: Payer: Self-pay

## 2023-12-17 DIAGNOSIS — Z01818 Encounter for other preprocedural examination: Secondary | ICD-10-CM

## 2023-12-17 DIAGNOSIS — M5412 Radiculopathy, cervical region: Secondary | ICD-10-CM

## 2023-12-17 DIAGNOSIS — M4802 Spinal stenosis, cervical region: Secondary | ICD-10-CM

## 2023-12-25 NOTE — Progress Notes (Signed)
 Review of Systems  Constitutional:  Positive for activity change.  HENT: Negative.    Eyes: Negative.   Respiratory: Negative.    Cardiovascular: Negative.   Gastrointestinal: Negative.   Endocrine: Negative.   Genitourinary: Negative.   Musculoskeletal:  Positive for arthralgias, gait problem, joint swelling, myalgias, neck pain and neck stiffness.  Skin: Negative.   Allergic/Immunologic: Positive for environmental allergies.  Neurological:  Positive for weakness and numbness.  Hematological: Negative.   Psychiatric/Behavioral: Negative.

## 2023-12-25 NOTE — Progress Notes (Signed)
 [facility] PHYSICAL THERAPY EVALUATION    DATE:12/26/2023 Patient's Name:Taylor [name]    DOB:Jul 23, 1960 Referring Physician:Hooten, Lynwood Philmon J*    Medical Diagnosis: S/P L TKA      Treatment Diagnosis: 1. Status post total left knee replacement   2. Left knee pain, unspecified chronicity   3. Knee stiff, left   4. Left leg weakness    Is patient aware of diagnosis/prognosis: [x]  YES      []  NO Subjective: Specific History Related to Current Episode: Pt reports long history of L knee pain with a scope 2 years ago.  She saw orthopedics, and Dr Mardee peformd a L TKA on 12/10/2023.  She has made good progress with HHPT, with up to 88 degrees of flexion.  She is using a RW for ambulation.  Of note, she is scheduled for a cervical fusion in December.  Past Medical history: Past Medical History:  Diagnosis Date  . Anemia   . Back pain   . Chickenpox   . Diabetes mellitus type 2, uncomplicated (CMS/HHS-HCC)    Gestational  . Esophageal spasm   . Hx of migraine headaches   . Hyperlipidemia   . Hypertension   . Psoriasis   . Thyroid  nodule    thyroid  ultrasound 03/2014   Past Surgical History:  Procedure Laterality Date  . COLONOSCOPY  09/17/2013   Indic: Screening. 1 Tubular Adenoma.  . COLONOSCOPY  08/13/2018   Indic: Diarrhea. Descending diverticulitis. Random bx - normal colonic mucosa. PHx polyps - CBF 08/2023  . EGD  08/13/2018   Indic: Dysphagia, GERD. Normal esophagus. Normal stomach (bx - reactive foveolar hyperplasia, fibrosis, and mixed inflammation c/w tissue adjacent to erosion; minimal chronic gastritis). Duodenitis in 2nd portion of duodenum (path - normal duodenal mucosa).  . Left knee arthroscopy, partial medial meniscectomy, and chondroplasty  05/16/2021   Dr Mardee  . Colon @ [facility]  11/06/2023   Sessile serrated adenoma/Tubular adenoma/Repeat 41yrs/SMR  . EGD @ [facility]  11/06/2023   Gastritis/No repeat/SMR  . back surgery    . BUNION  CORRECTION    . CESAREAN SECTION    . ENDOSCOPIC CARPAL TUNNEL RELEASE Right   . HYSTERECTOMY    . RIGHT LONG TRIGGER FINGER RELEASE    . RIGHT TRIGGER THUMB RELEASE    . Tendon surgery    . TRIGGER FINGER RELEASE      Social History: Home Environment Type of Home: House Home Layout: One level Access: Exterior stairs      Exterior stairs - rails: Both sides  Prior therapy for same condition:  Prior Interventions: No       Hospitalizations: Recent Hospitalization: Yes      Specifics: post-op  Prior level of function: Prior Function PLOF: Independent  Current Functional level: Current Function Lives With: Gilmer Comer help from: Family ADL Assistance: Independent Homemaking Assistance: Needs assistance Vocational: Disabled Reported Lifestyle Exercise/Hobbies: Yes      Exercise: Walking Patient Reported Goals: Return to Prior Level of Function, Reduce Pain, To improve walking and/or balance to prevent falls Current Level of Function Comments: Pt limited with significant WB activity  Pain scale: Pain Assessment Pain Assessment %%: 0-10 Pain Score %%:   2 Pain Type: Surgical pain Pain Loc: Knee Pain Orientation: Left, Anterior Pain Descriptors: Aching Pain Frequency: Intermittent Pain Onset: Gradual Clinical Progression: Gradually improving Pain Intervention(s): Medication (Non opioid given), Home medication  Aggravating: at night, transfers  Alleviating: tramadol, ice OBJECTIVE Postural/Observation: Pt stands with decreased  L knee extension and decreased LLE WB Surgical incision is clean an dry.  Moderate L calf swelling is noted  AROM:   Left Extension -11 Flexion  62  PROM:   Left Extension -7 Flexion  78  Strength: Quad activation on the L is fair for current stage  Functional Testing SL Stance Significant UE support required in L stance Squat   Moderate R weight shift, limited depth  Special tests: NA  Gait: Pt ambulates with RW,  decreased L knee flexion in swing phase, decreased overall cadence.    Palpation: Mild general warmth and TTP is noted over the anterior aspect of the L knee  ASSESSMENT: Symptoms are consistent with s/p L TKA.  Pt presents with L knee pain, stiffness, weakness, and decreased tolerance to activity.  She will benefit from physical therapy to address these issues and to restore function in the LLE.   PLAN: Patient's Goals: Decrease pain, restore function in the LEE  Long Term Goals: 1.  Decrease pain at its worst with activity to 2/10. 2.  The patient will be independent in a thorough HEP to improve ROM, strength, posture and function in an effort to return to their prior level of function. 3.  Increase AROM of the involved knee to 0 deg extension and 120 deg flexion or greater. 4.  Increase gross strength of the knee, hip and ankle to 5/5 throughout. 5.  The patient will be able to ambulate on all surfaces without the use of an assistive device demonstrating a normal reciprocal gait pattern to allow for normal ambulation at home and in the community. 6.  The patient will be able to perform sit to/from stand transfers with weight evenly distributed over the LEs without increased pain or discomfort to allow for normal transfers in/out of a chair or vehicle. 7.  The patient will be able to ambulate up/down a flight of stairs with a normal reciprocal gait pattern using a HR for balance assistance as needed without c/o increased knee pain or discomfort to allow for normal stair climbing at home and in the community.   Treatment Plan: The patient will receive modalities as needed including moist heat, ultrasound, infrared, iontophoresis, electrical stimulation, vasopneumatic compression and cold packs.  Therapeutic exercise and therapeutic activities will be performed to increase ROM, strength, flexibility, posture, ergonomics, balance, proprioception and function.  Manual therapy will be performed to  increase ROM, strength, mobility and flexibility as well as decrease tension, spasms and tissue texture.  Neuromuscular reeducation and activities of daily living training will be performed to increase strength, stabilization, posture, ergonomics, balance, proprioception and function.  The patient may receive gait training to normailize the gait cycle.  In addition, the patient will participate in a progressive home exercise program to complement the formal physical therapy sessions.  Frequency: 3 per week for 4-6 weeks. Precautions:    PT Billing Documentation PT Evaluation or progress note completed today: Yes Date of Onset: 12/10/23 Visit Number: 1     I certify the need for these services furnished under this plan of treatment and while under my care   Therapist:Justin LELON Hatchet, PT                        Date:12/26/2023  Hip/Knee Treatment Log   Date   10/16           Bike(seat____) x5'           Stepper (seat     )  Elliptical              LP  Squats/Plyo            LP  Single Leg            LP  calf raises            Wall Slide/mini/BOSU            Stool Scoots            Step (up,down,lateral)            Multi-Hip TKE            Multi-Hip Flex/Abd            Multi-Hip Ext/Add            Knee Extension(single/Bilat)            Leg Curls(single/Bilat) HS x 15           SAQ/LAQ            SLR (flex/abd/add/ext)            Quad Sets x10           Lunges            NMR: 3 point            NMR: T-band proprio            NMR: Wobble Board/BOSU            NMR: Agility                                                Flexionator/Extensionator HP x 2'           Stretch- Calf 2x30           PROM            Ultrasound/ Infrared            HP/CP with/ without  IFC            Vasopneumatic Level 1/2/3

## 2023-12-26 NOTE — Progress Notes (Signed)
 I certify that I have reviewed the Plan of Care for Taylor [name], Keehan 10-17-1960, MRN: [id number], and that services will be furnished while the patient is under my care.

## 2023-12-30 NOTE — Progress Notes (Signed)
 [facility]                    PT Daily Progress Note  Patient's Name:Taylor [name]                        MRN #[id number]      Date:12/30/2023 Visit Number: 2   Progress Related to Long and Short Term Goals:                                                                                            []  ROM: -4 - 88 []  Strength Pain -     while- [] at rest    [] During activity:  []  Functional goal:  where 0 = completely disabled, 100 equals full function.      []  Other: []  Balance:  Patient / Family Education:    []  Progressed HEP to include:  []  Reviewed previous HEP:   ___________________________________________________________________________________   Skilled Service Provided:  To: []  right   [x]  left     []  bilateral    []   core/body stability and balance  []  neck   []   shoulder   []  upper arm/  []  elbow   []  lower arm  []  wrist   []  hand   []   fingers  []  back     []  hip    []  upper leg    [x]  knee  []  lower leg   []   ankle  foot             Ther Ex Therapeutic Procedure CPT 97110 : 30 minutes         Manual Tx Manual Therapy CPT 97140: 10 minutes      E-Stim         Infrared     US  x min             Ice / Heat                Vaso-pneumaticcompress             [x]  ROM [x] joint mob [] inflam Setting: [] Circ  [] pain [] swelling  [] Flex [] Flex [] pain [] Healing [] pain [] inflam [] edema  [] Bal [x]  ROM [] swelling [] pain [] swelling [] swelling Temp:         F  Strength       [x]   [] ASTYM [] circulation [] circulation [] inflam [] circulation [] soft tissue mob   Level      [] -1        [] -2      [] -3  Progressive     E  HEP    Exercise          [x]   [] tension/spasms/soft tissue Mob [] tissue mob       Posture        []   [] Correct malaign [] tissue mob        Settings:        Subjective:  Pt reports that her knee is feeling generally a little better.  Treatment notes:  Manual L knee PROM, jt mobs.  Therex per flow chart.    Assessment  of Treatment: PROM significantly improved today into both flexion and extension.   Patient's Response to Treatment:  []  pain  []  pain    []   flexibility      [x]  endurance      [x]   strength     [x]   AROM      [x]   PROM       []   Spasms    []  Posture / alignment    []   Joint Mobility  []  Other:   Functional Improvement Noted:  Increased ability to  []  walk   []  stand for longer periods   []  dress with less help    []  lift     []   reach     []   bend    []  Other:  Remaining Impairment Requiring Continued Treatment:   []   flexibility   []   ROM   [x]   pain [x]  strength   [x]   weakness    []   balance   []   swelling   []   inflammation   []  Spasm  []  posture/alignment    []   Other:  Plan: [x]   Continue Plan of Care        []   Add:         []   Discharge after next visit    []   Discharge to HEP   []  Other:  Hip/Knee Treatment Log   Date   10/16 10/20          Bike(seat____) x5' L2 10'          Stepper (seat     )            Elliptical              LP  Squats/Plyo            LP  Single Leg            LP  calf raises  2x10          Wall Slide/mini/BOSU  2x10          Stool Scoots            Step (up,down,lateral)  4 3x10          Multi-Hip TKE  45# 3x10          Multi-Hip Flex/Abd            Multi-Hip Ext/Add            Knee Extension(single/Bilat)            Leg Curls(single/Bilat) HS x 15           SAQ/LAQ            SLR (flex/abd/add/ext)            Quad Sets x10           Lunges            NMR: 3 point            NMR: T-band proprio            NMR: Wobble Board/BOSU            NMR: Agility                                                Flexionator/Extensionator HP x 2'  Stretch- Calf 2x30           PROM  x15'          Ultrasound/ Infrared            HP/CP with/ without  IFC            Vasopneumatic Level 1/2/3               PT Billing Documentation Date of Onset: 12/10/23 Visit Number: 2       Frequently Used Timed Codes Therapeutic  Procedure CPT 97110 : 30 minutes Manual Therapy CPT 97140: 10 minutes                  Total Treatment Time: 40 minutes     Therapist's : Eva LELON Hatchet, PT

## 2023-12-31 ENCOUNTER — Telehealth: Payer: Self-pay

## 2023-12-31 NOTE — Telephone Encounter (Signed)
 Called patient for further elaboration on the effects cervical stenosis has had on patient's ability to perform ADLs. Taylor [name] complains of symptoms of numbness, tingling, and pain in her neck down both arms and into her fingers. She states that on a daily basis, she has episodes that last between 5 minutes and 2 hours, sometimes multiple times a day, that prevent her from being able to perform ADLs.   She states that when her hands are numb, she can not do several things and frequently has to stop whatever she is doing until the episode has passed. During an episode, she is unable to pick things up or move things because she is unable to feel if she has something in her hand or not. This has also caused her to drop things. This limits her ability to do chores around the house. She specifically states that she has trouble with folding clothes, cleaning dishes, cooking, cleaning, or anything really. She also describes difficulty with eating, bathing, and driving due to numbness in her hands during an episode. She also states that pain in her neck prevents her from being able to turn her head all the way when driving. While she is able to dress herself, the pain and numbness she experiences make it more difficult and it takes her longer to do.   These symptoms have had a negative impact on her quality of life and made it more challenging for her to be able to regularly perform her ADLs.

## 2024-01-12 ENCOUNTER — Telehealth: Payer: Self-pay

## 2024-01-12 NOTE — Telephone Encounter (Signed)
 Pt got a letter from medicare 10/22 that the surgery was not approved. They are wanting clarification due to getting a call from preop to schedule.

## 2024-01-21 ENCOUNTER — Telehealth: Payer: Self-pay | Admitting: Neurosurgery

## 2024-01-21 NOTE — Telephone Encounter (Signed)
 Pt is needing to know how soon before their surgery can they get an injection in their back, it was postponed.

## 2024-01-21 NOTE — Telephone Encounter (Signed)
 I spoke to patient and she is wanting the injection in her lower back. I informed her it is ok to have an injection in the low back at anytime, this will not interfere with the neck surgery. Patient voiced understanding.

## 2024-01-26 ENCOUNTER — Other Ambulatory Visit: Payer: Self-pay

## 2024-01-26 ENCOUNTER — Encounter
Admission: RE | Admit: 2024-01-26 | Discharge: 2024-01-26 | Disposition: A | Discharge status: Home / Self Care | Source: Ambulatory Visit | Attending: Neurosurgery | Admitting: Neurosurgery

## 2024-01-26 VITALS — BP 123/80 | HR 86 | Resp 16 | Ht 65.0 in | Wt 254.0 lb

## 2024-01-26 DIAGNOSIS — Z01818 Encounter for other preprocedural examination: Secondary | ICD-10-CM | POA: Diagnosis not present

## 2024-01-26 DIAGNOSIS — I6523 Occlusion and stenosis of bilateral carotid arteries: Secondary | ICD-10-CM | POA: Diagnosis not present

## 2024-01-26 DIAGNOSIS — Z01812 Encounter for preprocedural laboratory examination: Secondary | ICD-10-CM | POA: Diagnosis present

## 2024-01-26 DIAGNOSIS — E118 Type 2 diabetes mellitus with unspecified complications: Secondary | ICD-10-CM | POA: Insufficient documentation

## 2024-01-26 DIAGNOSIS — J42 Unspecified chronic bronchitis: Secondary | ICD-10-CM | POA: Insufficient documentation

## 2024-01-26 DIAGNOSIS — M4802 Spinal stenosis, cervical region: Secondary | ICD-10-CM

## 2024-01-26 DIAGNOSIS — I10 Essential (primary) hypertension: Secondary | ICD-10-CM | POA: Insufficient documentation

## 2024-01-26 DIAGNOSIS — Z01810 Encounter for preprocedural cardiovascular examination: Secondary | ICD-10-CM

## 2024-01-26 DIAGNOSIS — I25119 Atherosclerotic heart disease of native coronary artery with unspecified angina pectoris: Secondary | ICD-10-CM | POA: Diagnosis not present

## 2024-01-26 HISTORY — DX: Spinal stenosis, cervical region: M48.02

## 2024-01-26 HISTORY — DX: Radiculopathy, cervical region: M54.12

## 2024-01-26 HISTORY — DX: Iron deficiency anemia, unspecified: D50.9

## 2024-01-26 LAB — SURGICAL PCR SCREEN
MRSA, PCR: NEGATIVE
Staphylococcus aureus: NEGATIVE

## 2024-01-26 LAB — TYPE AND SCREEN
ABO/RH(D): B POS
Antibody Screen: NEGATIVE

## 2024-01-26 NOTE — Patient Instructions (Addendum)
 Your procedure is scheduled on:02-09-24 Monday Report to the Registration Desk on the 1st floor of the [facility].Then proceed to the 2nd floor Surgery Desk To find out your arrival time, please call [phone] between 1PM - 3PM on:02-06-24 Friday If your arrival time is 6:00 am, do not arrive before that time as the [facility] entrance doors do not open until 6:00 am.  REMEMBER: Instructions that are not followed completely may result in serious medical risk, up to and including death; or upon the discretion of your surgeon and anesthesiologist your surgery may need to be rescheduled.  Do not eat food after midnight the night before surgery.  No gum chewing or hard candies.  You may however, drink Water up to 2 hours before you are scheduled to arrive for your surgery. Do not drink anything within 2 hours of your scheduled arrival time.  One week prior to surgery:Last dose will be on 02-01-24 Sunday Stop ANY OVER THE COUNTER supplements until after surgery (Vitamin B12)  Stop clopidogrel (PLAVIX) 7 days prior to surgery-Last dose will be on 02-01-24 Sunday  Continue taking all of your other prescription medications up until the day of surgery.  ON THE DAY OF SURGERY ONLY TAKE THESE MEDICATIONS WITH SIPS OF WATER: -busPIRone (BUSPAR)  -levothyroxine (SYNTHROID)  -pregabalin (LYRICA)  -You may take clonazePAM (KLONOPIN) if needed for anxiety  Use your Advair and Albuterol  Inhaler the day of surgery and bring your Albuterol  Inhaler to the hospital  No Alcohol for 24 hours before or after surgery.  No Smoking including e-cigarettes for 24 hours before surgery.  No chewable tobacco products for at least 6 hours before surgery.  No nicotine patches on the day of surgery.  Do not use any recreational drugs for at least a week (preferably 2 weeks) before your surgery.  Please be advised that the combination of cocaine and anesthesia may have negative outcomes, up to and including  death. If you test positive for cocaine, your surgery will be cancelled.  On the morning of surgery brush your teeth with toothpaste and water, you may rinse your mouth with mouthwash if you wish. Do not swallow any toothpaste or mouthwash.  Use CHG Soap as directed on instruction sheet.  Do not wear jewelry, make-up, hairpins, clips or nail polish.  For welded (permanent) jewelry: bracelets, anklets, waist bands, etc.  Please have this removed prior to surgery.  If it is not removed, there is a chance that hospital personnel will need to cut it off on the day of surgery.  Do not wear lotions, powders, or perfumes.   Do not shave body hair from the neck down 48 hours before surgery.  Contact lenses, hearing aids and dentures may not be worn into surgery.  Do not bring valuables to the hospital. [facility] is not responsible for any missing/lost belongings or valuables.   Notify your doctor if there is any change in your medical condition (cold, fever, infection).  Wear comfortable clothing (specific to your surgery type) to the hospital.  After surgery, you can help prevent lung complications by doing breathing exercises.  Take deep breaths and cough every 1-2 hours. Your doctor may order a device called an Incentive Spirometer to help you take deep breaths. When coughing or sneezing, hold a pillow firmly against your incision with both hands. This is called "splinting." Doing this helps protect your incision. It also decreases belly discomfort.  If you are being admitted to the hospital overnight, leave  your suitcase in the car. After surgery it may be brought to your room.  In case of increased patient census, it may be necessary for you, the patient, to continue your postoperative care in the Same Day Surgery department.  If you are being discharged the day of surgery, you will not be allowed to drive home. You will need a responsible individual to drive you home and stay with  you for 24 hours after surgery.   If you are taking public transportation, you will need to have a responsible individual with you.  Please call the Pre-admissions Testing Dept. at [phone] if you have any questions about these instructions.  Surgery Visitation Policy:  Patients having surgery or a procedure may have two visitors.  Children under the age of 62 must have an adult with them who is not the patient.  Inpatient Visitation:    Visiting hours are 7 a.m. to 8 p.m. Up to four visitors are allowed at one time in a patient room. The visitors may rotate out with other people during the day.  One visitor age 72 or older may stay with the patient overnight and must be in the room by 8 p.m.    Pre-operative 4 CHG Bath Instructions   You can play a key role in reducing the risk of infection after surgery. Your skin needs to be as free of germs as possible. You can reduce the number of germs on your skin by washing with CHG (chlorhexidine  gluconate) soap before surgery. CHG is an antiseptic soap that kills germs and continues to kill germs even after washing.   DO NOT use if you have an allergy to chlorhexidine /CHG or antibacterial soaps. If your skin becomes reddened or irritated, stop using the CHG and notify one of our RNs at [phone].   Please shower with the CHG soap starting 4 days before surgery using the following schedule:     Please keep in mind the following:  DO NOT shave, including legs and underarms, starting the day of your first shower.   You may shave your face at any point before/day of surgery.  Place clean sheets on your bed the day you start using CHG soap. Use a clean washcloth (not used since being washed) for each shower. DO NOT sleep with pets once you start using the CHG.   CHG Shower Instructions:  If you choose to wash your hair and private area, wash first with your normal shampoo/soap.  After you use shampoo/soap, rinse your hair and body  thoroughly to remove shampoo/soap residue.  Turn the water OFF and apply about 3 tablespoons (45 ml) of CHG soap to a CLEAN washcloth.  Apply CHG soap ONLY FROM YOUR NECK DOWN TO YOUR TOES (washing for 3-5 minutes)  DO NOT use CHG soap on face, private areas, open wounds, or sores.  Pay special attention to the area where your surgery is being performed.  If you are having back surgery, having someone wash your back for you may be helpful. Wait 2 minutes after CHG soap is applied, then you may rinse off the CHG soap.  Pat dry with a clean towel  Put on clean clothes/pajamas   If you choose to wear lotion, please use ONLY the CHG-compatible lotions on the back of this paper.     Additional instructions for the day of surgery: DO NOT APPLY any lotions, deodorants, cologne, or perfumes.   Put on clean/comfortable clothes.  Brush your teeth.  Ask  your nurse before applying any prescription medications to the skin.      CHG Compatible Lotions   Aveeno Moisturizing lotion  Cetaphil Moisturizing Cream  Cetaphil Moisturizing Lotion  Clairol Herbal Essence Moisturizing Lotion, Dry Skin  Clairol Herbal Essence Moisturizing Lotion, Extra Dry Skin  Clairol Herbal Essence Moisturizing Lotion, Normal Skin  Curel Age Defying Therapeutic Moisturizing Lotion with Alpha Hydroxy  Curel Extreme Care Body Lotion  Curel Soothing Hands Moisturizing Hand Lotion  Curel Therapeutic Moisturizing Cream, Fragrance-Free  Curel Therapeutic Moisturizing Lotion, Fragrance-Free  Curel Therapeutic Moisturizing Lotion, Original Formula  Eucerin Daily Replenishing Lotion  Eucerin Dry Skin Therapy Plus Alpha Hydroxy Crme  Eucerin Dry Skin Therapy Plus Alpha Hydroxy Lotion  Eucerin Original Crme  Eucerin Original Lotion  Eucerin Plus Crme Eucerin Plus Lotion  Eucerin TriLipid Replenishing Lotion  Keri Anti-Bacterial Hand Lotion  Keri Deep Conditioning Original Lotion Dry Skin Formula Softly Scented  Keri  Deep Conditioning Original Lotion, Fragrance Free Sensitive Skin Formula  Keri Lotion Fast Absorbing Fragrance Free Sensitive Skin Formula  Keri Lotion Fast Absorbing Softly Scented Dry Skin Formula  Keri Original Lotion  Keri Skin Renewal Lotion Keri Silky Smooth Lotion  Keri Silky Smooth Sensitive Skin Lotion  Nivea Body Creamy Conditioning Oil  Nivea Body Extra Enriched Teacher, Adult Education Moisturizing Lotion Nivea Crme  Nivea Skin Firming Lotion  NutraDerm 30 Skin Lotion  NutraDerm Skin Lotion  NutraDerm Therapeutic Skin Cream  NutraDerm Therapeutic Skin Lotion  ProShield Protective Hand Cream  Provon moisturizing lotion  Merchandiser, Retail to address health-related social needs:  [email or web address]

## 2024-01-29 ENCOUNTER — Encounter (INDEPENDENT_AMBULATORY_CARE_PROVIDER_SITE_OTHER): Payer: Self-pay

## 2024-01-29 NOTE — Progress Notes (Signed)
 Perioperative / Anesthesia Services  Pre-Admission Testing Clinical Review / Pre-Operative Anesthesia Consult  Date: 01/29/24  PATIENT DEMOGRAPHICS: Name: Taylor [name] DOB: [birth year] MRN:   [id number]  Note: Available PAT nursing documentation and vital signs have been reviewed. Clinical nursing staff has updated patient's PMH/PSHx, current medication list, and drug allergies/intolerances to ensure complete and comprehensive history available to assist care teams in MDM as it pertains to the aforementioned surgical procedure and anticipated anesthetic course. Extensive review of available clinical information personally performed. [place] [facility] and PSH[facility] updated with any diagnoses/procedures that  may have been inadvertently omitted during her intake with the pre-admission testing department's nursing staff.  PLANNED SURGICAL PROCEDURE(S):   Case: [id number] Date/Time: 02/09/24 0700   Procedure: ANTERIOR CERVICAL DECOMPRESSION/DISCECTOMY FUSION 2 LEVELS   Anesthesia type: General   Diagnosis:      Cervical radiculopathy [M54.12]     Cervical stenosis of spinal canal [M48.02]   Pre-op diagnosis:      M54.12 Cervical radiculopathy     M48.02 Cervical stenosis of spinal canal   Location: [facility] OR ROOM 03 / [facility]   Surgeons: Clois Fret, MD        CLINICAL DISCUSSION: Taylor [name] is a 63 y.o. female who is submitted for pre-surgical anesthesia review and clearance prior to her undergoing the above procedure. Patient is a Former Smoker (quit 03/2016). Pertinent PMH includes: CAD, diastolic dysfunction, PSVT, BILATERAL carotid artery disease, aortic atherosclerosis, HTN, HLD, T2DM, hypothyroidism, thyroid  nodules, dyspnea, COPD, OSAH (has not started nocturnal PAP therapy), GERD (no daily Tx), esophageal spasms, anemia, MGUS, OA, psoriatic arthritis, lumbar DDD (s/p L4-S1 fusion), anxiety (on BZO), depression.  Patient is followed by cardiology Juanice,  MD). She was last seen in the cardiology clinic on 11/28/2023; notes reviewed. At the time of her clinic visit, patient doing well overall from a cardiovascular perspective.  Patient with complaints of minor peripheral edema associated with prolonged sitting.  Patient denied any chest pain, shortness of breath, PND, orthopnea, palpitations, weakness, fatigue, vertiginous symptoms, or presyncope/syncope. Patient with a past medical history significant for cardiovascular diagnoses. Documented physical exam was grossly benign, providing no evidence of acute exacerbation and/or decompensation of the patient's known cardiovascular conditions.  Most recent myocardial perfusion imaging study was performed on 09/03/2019 revealing a normal left ventricular systolic function with an EF of 61%.  There were no regional wall motion abnormalities.  No artifact or left ventricular cavity size enlargement appreciated on review of imaging. SPECT images demonstrated no evidence of stress-induced myocardial ischemia or arrhythmia; no scintigraphic evidence of scar.  TID ratio = 0.91 (normal range </= 1.2). Study determined to be normal and low risk.  Most recent TTE performed on 03/21/2022 revealed a normal left ventricular systolic function with an EF of >55% %. There was mild LVH.  There were no regional wall motion abnormalities. Left ventricular diastolic Doppler parameters consistent with abnormal relaxation (G1DD).  Right ventricular size and function normal. There was trivial pan valvular regurgitation.  All transvalvular gradients were noted to be normal providing no evidence of hemodynamically significant valvular stenosis. Aorta normal in size with no evidence of ectasia or aneurysmal dilatation.  Patient is on daily antithrombotic therapy using clopidogrel.  She is reportedly compliant with therapy with no evidence or reports of GI/GU related bleeding.  Blood pressure reasonably controlled at 132/82 mmHg on currently  prescribed beta-blocker/diuretic (bisoprolol-HCTZ) and diuretic (torsemide) therapies.  Patient is on rosuvastatin for her HLD diagnosis and ASCVD  prevention. T2DM well controlled on currently prescribed regimen; last HgbA1c was 7.0% when checked on 12/03/2023. She does not have an OSAH diagnosis. Patient is able to complete all of her  ADL/IADLs without cardiovascular limitation.  Per the [facility], patient is able to achieve at least 4 METS of physical activity without experiencing any significant degree of angina/anginal equivalent symptoms. No changes were made to her medication regimen during her visit with cardiology.  Patient scheduled to follow-up with outpatient cardiology in 6 months or sooner if needed.  Taylor [name] is scheduled for an elective ANTERIOR CERVICAL DECOMPRESSION/DISCECTOMY FUSION 2 LEVELS on 12/10/2023 with Dr. Reeves Daisy MD. Given patient's past medical history significant for cardiovascular diagnoses, presurgical cardiac clearance was sought by the PAT team. Per cardiology, this patient is optimized for surgery and may proceed with the planned procedural course with a ACCEPTABLE risk of significant perioperative cardiovascular complications.  In review of the patient's chart, it is noted that she is on daily oral antithrombotic therapy. She has been instructed on recommendations for holding her clopidogrel  for 7 days prior to her procedure with plans to restart as soon as postoperative bleeding risk felt to be minimized by his primary attending surgeon. The patient has been instructed that her last dose should be on 02/01/2024.  Patient denies previous perioperative complications with anesthesia in the past. In review her EMR, it is noted that patient underwent a neuraxial anesthetic course here at [facility] in 12/2023 without documented complications.   MOST RECENT VITAL SIGNS:    01/26/2024    9:04 AM 12/11/2023   10:27 AM  12/11/2023    7:31 AM  Vitals with BMI  Height 5' 5    Weight 254 lbs    BMI 42.27    Systolic 123 122 864  Diastolic 80 66 72  Pulse 86 70 76   PROVIDERS/SPECIALISTS: NOTE: Primary physician provider listed below. Patient may have been seen by APP or partner within same practice.   PROVIDER ROLE / SPECIALTY LAST [facility]ERLEAN Daisy Reeves, MD Neurosurgery (Surgeon) 10/09/2023  Auston Reyes BIRCH, MD Primary Care Provider 10/24/2023  Dewane Shiner, DO Cardiology 11/28/2023  Tobie Solo, MD Rheumatology 11/11/2023  Avanell Katz, MD Physiatry 11/04/2023   ALLERGIES: Allergies  Allergen Reactions   Aspirin  Hives and Swelling   Penicillins Hives    IgE = 168 (WNL) on 12/03/2023    CURRENT HOME MEDICATIONS: No current facility-administered medications for this encounter.    ADVAIR HFA 115-21 MCG/ACT inhaler   albuterol  (VENTOLIN  HFA) 108 (90 Base) MCG/ACT inhaler   bisoprolol -hydrochlorothiazide  (ZIAC ) 10-6.25 MG tablet   buPROPion  (WELLBUTRIN  XL) 150 MG 24 hr tablet   busPIRone  (BUSPAR ) 10 MG tablet   celecoxib  (CELEBREX ) 200 MG capsule   clonazePAM  (KLONOPIN ) 0.5 MG tablet   clopidogrel  (PLAVIX ) 75 MG tablet   cyanocobalamin (VITAMIN B12) 1000 MCG/ML injection   escitalopram  (LEXAPRO ) 20 MG tablet   glimepiride  (AMARYL ) 4 MG tablet   levothyroxine  (SYNTHROID ) 50 MCG tablet   nortriptyline  (PAMELOR ) 25 MG capsule   pregabalin  (LYRICA ) 200 MG capsule   rosuvastatin  (CRESTOR ) 40 MG tablet   torsemide  (DEMADEX ) 20 MG tablet   traMADol  (ULTRAM ) 50 MG tablet   vitamin B-12 (CYANOCOBALAMIN) 1000 MCG tablet   Vitamin D, Ergocalciferol, (DRISDOL) 1.25 MG (50000 UNIT) CAPS capsule   oxyCODONE  (OXY IR/ROXICODONE ) 5 MG immediate release tablet   STELARA 90 MG/ML SOSY injection   HISTORY: Past Medical History:  Diagnosis Date  Anxiety    a.) on BZO (clonazepam) PRN   Aortic atherosclerosis    Arthritis    B12 deficiency    Bilateral carotid artery disease    Cervical  stenosis of spine    Chronic back pain    COPD (chronic obstructive pulmonary disease) (HCC)    Coronary artery calcification seen on CT scan    DDD (degenerative disc disease), lumbar    a.) s/p L4-5 and L5-S1 fusion   Depression    Diastolic dysfunction 09/01/2019   a.) TTE 09/01/2019: >55%; mild LA enlargement; triv PR, mild MR/TR; G1DD.   Dyspnea    Esophageal spasm    GERD (gastroesophageal reflux disease)    High cholesterol    History of methicillin resistant staphylococcus aureus (MRSA) 2005   Hypertension    Hypothyroidism    IDA (iron deficiency anemia)    Long term current use of clopidogrel    Long term current use of immunosuppressive drug    a.) on ustekinumab (Stelara) for psoriatic athritis   Migraines    Monoclonal gammopathy of unknown significance (MGUS) 01/01/2021   a.) IgG lambda MGUS   Morbid obesity (HCC)    OSA (obstructive sleep apnea)    Has not picked up cpap yet as of 01-26-24 due to cost   Pneumonia    Psoriasis    Psoriatic arthritis (HCC)    a.) will not start  ustekinumab (Stelara) until after cervical spine surgery   PSVT (paroxysmal supraventricular tachycardia)    Radiculopathy of cervical spine    Right carpal tunnel syndrome s/p release    Sensory polyneuropathy    T2DM (type 2 diabetes mellitus) (HCC)    Thyroid  nodule    Past Surgical History:  Procedure Laterality Date   ABDOMINAL HYSTERECTOMY     BACK SURGERY     2 level fusion   BREAST BIOPSY Left 08/23/2013   stereo, negative   BUNIONECTOMY Bilateral    carpal tunnel Right    CESAREAN SECTION     x1   COLONOSCOPY N/A 11/06/2023   Procedure: COLONOSCOPY;  Surgeon: Onita Elspeth Sharper, DO;  Location: [facility] ENDOSCOPY;  Service: Endoscopy;  Laterality: N/A;   COLONOSCOPY WITH ESOPHAGOGASTRODUODENOSCOPY (EGD)     CYST EXCISION     lower back   DILATION AND CURETTAGE OF UTERUS     ESOPHAGOGASTRODUODENOSCOPY N/A 11/06/2023   Procedure: EGD (ESOPHAGOGASTRODUODENOSCOPY);   Surgeon: Onita Elspeth Sharper, DO;  Location: [facility] ENDOSCOPY;  Service: Endoscopy;  Laterality: N/A;   KNEE ARTHROPLASTY Left 12/10/2023   Procedure: ARTHROPLASTY, KNEE, TOTAL, USING IMAGELESS COMPUTER-ASSISTED NAVIGATION;  Surgeon: Mardee Lynwood SQUIBB, MD;  Location: [facility] ORS;  Service: Orthopedics;  Laterality: Left;   KNEE ARTHROSCOPY Left 05/16/2021   Procedure: ARTHROSCOPY KNEE;  Surgeon: Mardee Lynwood SQUIBB, MD;  Location: [facility] ORS;  Service: Orthopedics;  Laterality: Left;   MALONEY DILATION  11/06/2023   Procedure: DILATION, ESOPHAGUS, USING MALONEY DILATOR;  Surgeon: Onita Elspeth Sharper, DO;  Location: [facility] ENDOSCOPY;  Service: Endoscopy;;   POLYPECTOMY  11/06/2023   Procedure: POLYPECTOMY, INTESTINE;  Surgeon: Onita Elspeth Sharper, DO;  Location: [facility] ENDOSCOPY;  Service: Endoscopy;;   SHOULDER ARTHROSCOPY WITH SUBACROMIAL DECOMPRESSION, ROTATOR CUFF REPAIR AND BICEP TENDON REPAIR Left 02/14/2022   Procedure: SHOULDER ARTHROSCOPY WITH DEBRIDEMENT, DECOMPRESSION, ROTATOR CUFF REPAIR AND BICEPS TENODESIS.;  Surgeon: Edie Norleen PARAS, MD;  Location: [facility] ORS;  Service: Orthopedics;  Laterality: Left;   tendinitis Right    wrist   TONSILLECTOMY     TRIGGER FINGER RELEASE  Right    x 2 thumb and middle finger   Family History  Problem Relation Age of Onset   Breast cancer Mother 3   Social History   Tobacco Use   Smoking status: Former    Current packs/day: 0.00    Types: Cigarettes    Quit date: 03/11/2016    Years since quitting: 7.8   Smokeless tobacco: Never  Substance Use Topics   Alcohol use: Not Currently    Comment: last drink in 2023   LABS:  Hospital Outpatient Visit on 01/26/2024  Component Date Value Ref Range Status   ABO/RH(D) 01/26/2024 B POS   Final   Antibody Screen 01/26/2024 NEG   Final   Sample Expiration 01/26/2024 02/09/2024,2359   Final   Extend sample reason 01/26/2024    Final                   Value:NO TRANSFUSIONS OR PREGNANCY IN THE PAST 3  MONTHS Performed at [facility], [address] [place], [place] [address]    MRSA, PCR 01/26/2024 NEGATIVE  NEGATIVE Final   Staphylococcus aureus 01/26/2024 NEGATIVE  NEGATIVE Final   Comment: (NOTE) The Xpert SA Assay (FDA approved for NASAL specimens in patients 10 years of age and older), is one component of a comprehensive surveillance program. It is not intended to diagnose infection nor to guide or monitor treatment. Performed at [facility], [address] [place], [place] [address]     ECG: Date: 01/26/2024 Time ECG obtained: 1002 AM Rate: 76 bpm Rhythm: normal sinus Axis (leads I and aVF): normal Intervals: PR 190 ms. QRS 88 ms. QTc 450 ms. ST segment and T wave changes: No evidence of acute T wave abnormalities or significant ST segment elevation or depression.  Evidence of a possible, age undetermined, prior infarct:  No Comparison: Similar to previous tracing obtained on 02/08/2022   IMAGING / PROCEDURES: DIAGNOSTIC RADIOGRAPHS OF LEFT KNEE 3 VIEWS performed on 12/04/2023 Noticeable joint space closure along the medial cartilage space with bone-on-bone articulation noted.   Slight deformity along the tibial metadiaphysis consistent with potential previous injury.   Subchondral sclerosis is noted.   Osteophyte formation is present.   Overall alignment is relative varus.   No fractures, lytic lesions or gross fomites appreciated on films.   MR LUMBAR SPINE WO CONTRAST performed on 09/27/2023 Anterior and interbody fusion hardware at L4-5 and L5-S1 without obvious complicating features. Mild spinal stenosis and moderate bilateral lateral recess stenosis at L3-4. Extraforaminal encroachment on the left L3 nerve root. Mild bilateral lateral recess stenosis at L2-3.  PULMONARY FUNCTION TESTING performed on 07/30/2023 FVC was 2.56 L, 82 % of predicted  FEV1 was 1.85 L, 76 % of predicted  FEV1/FVC ratio was  92 % of predicted  FEF 25-75%  liters per second was 56 % of predicted  TLC was 99 % of predicted  RV was 132 % of predicted  DLCO was 107 % of predicted  DLCO/VA was 116 % of predicted  Good patient effort with good repeatability.   TRANSTHORACIC ECHOCARDIOGRAM performed on 03/21/2022 Normal left ventricular systolic function with an EF of >55% Mild LVH Left ventricular diastolic Doppler parameters consistent with abnormal relaxation (G1DD). Normal right ventricular size and function Trivial pan valvular regurgitation Normal gradients; no valvular stenosis  MYOCARDIAL PERFUSION IMAGING STUDY (LEXISCAN) performed on 09/03/2019 Normal left ventricular systolic function with a normal LVEF of 81% Normal myocardial thickening and wall motion Left ventricular cavity size  normal SPECT images demonstrate homogenous tracer distribution throughout the myocardium No evidence of stress-induced myocardial ischemia or arrhythmia Normal low risk study  IMPRESSION AND PLAN: Taylor [name] has been referred for pre-anesthesia review and clearance prior to her undergoing the planned anesthetic and procedural courses. Available labs, pertinent testing, and imaging results were personally reviewed by me in preparation for upcoming operative/procedural course. [facility] medical record has been updated following extensive record review and patient interview with PAT staff.   This patient has been appropriately cleared by cardiology with an overall ACCEPTABLE risk of patient experiencing significant perioperative cardiovascular complications. Based on clinical review performed today (01/29/24), barring any significant acute changes in the patient's overall condition, it is anticipated that she will be able to proceed with the planned surgical intervention. Any acute changes in clinical condition may necessitate her procedure being postponed and/or cancelled. Patient will meet with anesthesia team (MD and/or CRNA) on the day of her procedure  for preoperative evaluation/assessment. Questions regarding anesthetic course will be fielded at that time.   Pre-surgical instructions were reviewed with the patient during his PAT appointment, and questions were fielded to satisfaction by PAT clinical staff. She has been instructed on which medications that she will need to hold prior to surgery, as well as the ones that have been deemed safe/appropriate to take on the day of her procedure. As part of the general education provided by PAT, patient made aware both verbally and in writing, that she would need to abstain from the use of any illegal substances during her perioperative course. She was advised that failure to follow the provided instructions could necessitate case cancellation or result in serious perioperative complications up to and including death. Patient encouraged to contact PAT and/or her surgeon's office to discuss any questions or concerns that may arise prior to surgery; verbalized understanding.   Dorise Pereyra, MSN, APRN, FNP-C, CEN [facility]  Perioperative Services Nurse Practitioner Phone: [phone] Fax: [phone] 01/29/24 8:22 AM  NOTE: This note has been prepared using Dragon dictation software. Despite my best ability to proofread, there is always the potential that unintentional transcriptional errors may still occur from this process.

## 2024-02-03 ENCOUNTER — Other Ambulatory Visit (INDEPENDENT_AMBULATORY_CARE_PROVIDER_SITE_OTHER): Payer: Self-pay | Admitting: Nurse Practitioner

## 2024-02-03 ENCOUNTER — Ambulatory Visit (INDEPENDENT_AMBULATORY_CARE_PROVIDER_SITE_OTHER): Admitting: Nurse Practitioner

## 2024-02-03 ENCOUNTER — Ambulatory Visit (INDEPENDENT_AMBULATORY_CARE_PROVIDER_SITE_OTHER)

## 2024-02-03 ENCOUNTER — Encounter (INDEPENDENT_AMBULATORY_CARE_PROVIDER_SITE_OTHER): Payer: Self-pay | Admitting: Nurse Practitioner

## 2024-02-03 VITALS — BP 113/80 | HR 85 | Resp 18 | Ht 64.0 in | Wt 253.4 lb

## 2024-02-03 DIAGNOSIS — I10 Essential (primary) hypertension: Secondary | ICD-10-CM

## 2024-02-03 DIAGNOSIS — M7989 Other specified soft tissue disorders: Secondary | ICD-10-CM | POA: Diagnosis not present

## 2024-02-03 DIAGNOSIS — R600 Localized edema: Secondary | ICD-10-CM

## 2024-02-09 ENCOUNTER — Ambulatory Visit: Payer: Self-pay | Admitting: Urgent Care

## 2024-02-09 ENCOUNTER — Ambulatory Visit
Admission: RE | Admit: 2024-02-09 | Discharge: 2024-02-09 | Disposition: A | Discharge status: Home / Self Care | Source: Ambulatory Visit | Attending: Neurosurgery | Admitting: Neurosurgery

## 2024-02-09 ENCOUNTER — Ambulatory Visit

## 2024-02-09 ENCOUNTER — Encounter (INDEPENDENT_AMBULATORY_CARE_PROVIDER_SITE_OTHER): Payer: Self-pay | Admitting: Nurse Practitioner

## 2024-02-09 ENCOUNTER — Other Ambulatory Visit: Payer: Self-pay

## 2024-02-09 ENCOUNTER — Encounter
Admission: RE | Disposition: A | Discharge status: Home / Self Care | Payer: Self-pay | Source: Ambulatory Visit | Attending: Neurosurgery

## 2024-02-09 ENCOUNTER — Encounter: Payer: Self-pay | Admitting: Neurosurgery

## 2024-02-09 DIAGNOSIS — Z87891 Personal history of nicotine dependence: Secondary | ICD-10-CM | POA: Insufficient documentation

## 2024-02-09 DIAGNOSIS — I10 Essential (primary) hypertension: Secondary | ICD-10-CM | POA: Insufficient documentation

## 2024-02-09 DIAGNOSIS — D649 Anemia, unspecified: Secondary | ICD-10-CM | POA: Diagnosis not present

## 2024-02-09 DIAGNOSIS — L4050 Arthropathic psoriasis, unspecified: Secondary | ICD-10-CM | POA: Diagnosis not present

## 2024-02-09 DIAGNOSIS — I2510 Atherosclerotic heart disease of native coronary artery without angina pectoris: Secondary | ICD-10-CM | POA: Diagnosis not present

## 2024-02-09 DIAGNOSIS — R519 Headache, unspecified: Secondary | ICD-10-CM | POA: Insufficient documentation

## 2024-02-09 DIAGNOSIS — Z7984 Long term (current) use of oral hypoglycemic drugs: Secondary | ICD-10-CM | POA: Diagnosis not present

## 2024-02-09 DIAGNOSIS — M5412 Radiculopathy, cervical region: Secondary | ICD-10-CM | POA: Diagnosis present

## 2024-02-09 DIAGNOSIS — Z981 Arthrodesis status: Secondary | ICD-10-CM | POA: Insufficient documentation

## 2024-02-09 DIAGNOSIS — M4802 Spinal stenosis, cervical region: Secondary | ICD-10-CM | POA: Diagnosis not present

## 2024-02-09 DIAGNOSIS — Z7902 Long term (current) use of antithrombotics/antiplatelets: Secondary | ICD-10-CM | POA: Diagnosis not present

## 2024-02-09 DIAGNOSIS — E039 Hypothyroidism, unspecified: Secondary | ICD-10-CM | POA: Insufficient documentation

## 2024-02-09 DIAGNOSIS — F32A Depression, unspecified: Secondary | ICD-10-CM | POA: Diagnosis not present

## 2024-02-09 DIAGNOSIS — F419 Anxiety disorder, unspecified: Secondary | ICD-10-CM | POA: Diagnosis not present

## 2024-02-09 DIAGNOSIS — Z7951 Long term (current) use of inhaled steroids: Secondary | ICD-10-CM | POA: Insufficient documentation

## 2024-02-09 DIAGNOSIS — Z6841 Body Mass Index (BMI) 40.0 and over, adult: Secondary | ICD-10-CM | POA: Diagnosis not present

## 2024-02-09 DIAGNOSIS — M542 Cervicalgia: Secondary | ICD-10-CM | POA: Insufficient documentation

## 2024-02-09 DIAGNOSIS — J449 Chronic obstructive pulmonary disease, unspecified: Secondary | ICD-10-CM | POA: Insufficient documentation

## 2024-02-09 DIAGNOSIS — M2578 Osteophyte, vertebrae: Secondary | ICD-10-CM | POA: Diagnosis not present

## 2024-02-09 DIAGNOSIS — G4733 Obstructive sleep apnea (adult) (pediatric): Secondary | ICD-10-CM | POA: Diagnosis not present

## 2024-02-09 DIAGNOSIS — E66813 Obesity, class 3: Secondary | ICD-10-CM | POA: Diagnosis not present

## 2024-02-09 DIAGNOSIS — E1151 Type 2 diabetes mellitus with diabetic peripheral angiopathy without gangrene: Secondary | ICD-10-CM | POA: Insufficient documentation

## 2024-02-09 DIAGNOSIS — Z01818 Encounter for other preprocedural examination: Secondary | ICD-10-CM

## 2024-02-09 DIAGNOSIS — M1990 Unspecified osteoarthritis, unspecified site: Secondary | ICD-10-CM | POA: Diagnosis not present

## 2024-02-09 DIAGNOSIS — R1310 Dysphagia, unspecified: Secondary | ICD-10-CM | POA: Diagnosis not present

## 2024-02-09 DIAGNOSIS — G8929 Other chronic pain: Secondary | ICD-10-CM | POA: Diagnosis not present

## 2024-02-09 DIAGNOSIS — E785 Hyperlipidemia, unspecified: Secondary | ICD-10-CM | POA: Insufficient documentation

## 2024-02-09 DIAGNOSIS — E7800 Pure hypercholesterolemia, unspecified: Secondary | ICD-10-CM | POA: Insufficient documentation

## 2024-02-09 HISTORY — PX: ANTERIOR CERVICAL DECOMP/DISCECTOMY FUSION: SHX1161

## 2024-02-09 LAB — GLUCOSE, CAPILLARY
Glucose-Capillary: 187 mg/dL — ABNORMAL HIGH (ref 70–99)
Glucose-Capillary: 212 mg/dL — ABNORMAL HIGH (ref 70–99)

## 2024-02-09 SURGERY — ANTERIOR CERVICAL DECOMPRESSION/DISCECTOMY FUSION 2 LEVELS
Anesthesia: General | Site: Neck

## 2024-02-09 MED ORDER — LIDOCAINE HCL (CARDIAC) PF 100 MG/5ML IV SOSY
PREFILLED_SYRINGE | INTRAVENOUS | Status: DC | PRN
  Administered 2024-02-09: 100 mg via INTRAVENOUS

## 2024-02-09 MED ORDER — METHOCARBAMOL 500 MG PO TABS
ORAL_TABLET | ORAL | Status: AC
  Filled 2024-02-09: qty 1

## 2024-02-09 MED ORDER — ALBUTEROL SULFATE HFA 108 (90 BASE) MCG/ACT IN AERS
INHALATION_SPRAY | RESPIRATORY_TRACT | Status: DC | PRN
  Administered 2024-02-09: 6 via RESPIRATORY_TRACT

## 2024-02-09 MED ORDER — PROPOFOL 10 MG/ML IV BOLUS
INTRAVENOUS | Status: DC | PRN
  Administered 2024-02-09: 70 mg via INTRAVENOUS
  Administered 2024-02-09: 130 mg via INTRAVENOUS
  Administered 2024-02-09: 100 ug/kg/min via INTRAVENOUS

## 2024-02-09 MED ORDER — KETAMINE HCL 50 MG/5ML IJ SOSY
PREFILLED_SYRINGE | INTRAMUSCULAR | Status: AC
  Filled 2024-02-09: qty 5

## 2024-02-09 MED ORDER — ACETAMINOPHEN 10 MG/ML IV SOLN
INTRAVENOUS | Status: AC
  Filled 2024-02-09: qty 100

## 2024-02-09 MED ORDER — OXYCODONE HCL 5 MG PO TABS
ORAL_TABLET | ORAL | Status: AC
  Filled 2024-02-09: qty 1

## 2024-02-09 MED ORDER — ACETAMINOPHEN 10 MG/ML IV SOLN: 1000.0000 mg | Freq: Once | INTRAVENOUS | Status: DC | PRN

## 2024-02-09 MED ORDER — FENTANYL CITRATE (PF) 100 MCG/2ML IJ SOLN
25.0000 ug | INTRAMUSCULAR | Status: DC | PRN
  Administered 2024-02-09 (×4): 25 ug via INTRAVENOUS

## 2024-02-09 MED ORDER — OXYCODONE HCL 5 MG PO TABS
5.0000 mg | ORAL_TABLET | ORAL | 0 refills | Status: AC | PRN
  Filled 2024-02-09: qty 30, 5d supply, fill #0

## 2024-02-09 MED ORDER — OXYCODONE HCL 5 MG/5ML PO SOLN: 5.0000 mg | Freq: Once | ORAL | Status: AC | PRN

## 2024-02-09 MED ORDER — METHOCARBAMOL 500 MG PO TABS
500.0000 mg | ORAL_TABLET | Freq: Three times a day (TID) | ORAL | Status: DC
  Administered 2024-02-09: 500 mg via ORAL

## 2024-02-09 MED ORDER — MIDAZOLAM HCL (PF) 2 MG/2ML IJ SOLN
INTRAMUSCULAR | Status: DC | PRN
  Administered 2024-02-09: 2 mg via INTRAVENOUS

## 2024-02-09 MED ORDER — SURGIFLO WITH THROMBIN (HEMOSTATIC MATRIX KIT) OPTIME
TOPICAL | Status: DC | PRN
  Administered 2024-02-09: 1 via TOPICAL

## 2024-02-09 MED ORDER — SODIUM CHLORIDE 0.9 % IV SOLN: INTRAVENOUS | Status: DC

## 2024-02-09 MED ORDER — 0.9 % SODIUM CHLORIDE (POUR BTL) OPTIME
TOPICAL | Status: DC | PRN
  Administered 2024-02-09: 500 mL

## 2024-02-09 MED ORDER — ONDANSETRON HCL 4 MG/2ML IJ SOLN
INTRAMUSCULAR | Status: AC
  Filled 2024-02-09: qty 2

## 2024-02-09 MED ORDER — CEFAZOLIN SODIUM-DEXTROSE 2-4 GM/100ML-% IV SOLN
INTRAVENOUS | Status: AC
  Filled 2024-02-09: qty 100

## 2024-02-09 MED ORDER — DROPERIDOL 2.5 MG/ML IJ SOLN: 0.6250 mg | Freq: Once | INTRAMUSCULAR | Status: DC | PRN

## 2024-02-09 MED ORDER — BUPIVACAINE-EPINEPHRINE (PF) 0.5% -1:200000 IJ SOLN
INTRAMUSCULAR | Status: DC | PRN
  Administered 2024-02-09: 10 mL

## 2024-02-09 MED ORDER — FENTANYL CITRATE (PF) 100 MCG/2ML IJ SOLN
INTRAMUSCULAR | Status: AC
  Filled 2024-02-09: qty 2

## 2024-02-09 MED ORDER — DEXAMETHASONE SOD PHOSPHATE PF 10 MG/ML IJ SOLN
INTRAMUSCULAR | Status: DC | PRN
  Administered 2024-02-09: 10 mg via INTRAVENOUS

## 2024-02-09 MED ORDER — INSULIN ASPART 100 UNIT/ML IJ SOLN
INTRAMUSCULAR | Status: AC
  Filled 2024-02-09: qty 5

## 2024-02-09 MED ORDER — ACETAMINOPHEN 10 MG/ML IV SOLN
INTRAVENOUS | Status: DC | PRN
  Administered 2024-02-09: 1000 mg via INTRAVENOUS

## 2024-02-09 MED ORDER — BUPIVACAINE-EPINEPHRINE (PF) 0.5% -1:200000 IJ SOLN
INTRAMUSCULAR | Status: AC
  Filled 2024-02-09: qty 10

## 2024-02-09 MED ORDER — PROPOFOL 1000 MG/100ML IV EMUL
INTRAVENOUS | Status: AC
  Filled 2024-02-09: qty 100

## 2024-02-09 MED ORDER — SUCCINYLCHOLINE CHLORIDE 200 MG/10ML IV SOSY
PREFILLED_SYRINGE | INTRAVENOUS | Status: DC | PRN
  Administered 2024-02-09: 120 mg via INTRAVENOUS

## 2024-02-09 MED ORDER — OXYCODONE HCL 5 MG PO TABS
5.0000 mg | ORAL_TABLET | Freq: Once | ORAL | Status: AC | PRN
  Administered 2024-02-09: 5 mg via ORAL

## 2024-02-09 MED ORDER — ALBUTEROL SULFATE HFA 108 (90 BASE) MCG/ACT IN AERS
INHALATION_SPRAY | RESPIRATORY_TRACT | Status: AC
  Filled 2024-02-09: qty 6.7

## 2024-02-09 MED ORDER — CHLORHEXIDINE GLUCONATE 0.12 % MT SOLN
OROMUCOSAL | Status: AC
  Filled 2024-02-09: qty 15

## 2024-02-09 MED ORDER — OXYCODONE HCL 5 MG PO TABS
5.0000 mg | ORAL_TABLET | Freq: Once | ORAL | Status: AC
  Administered 2024-02-09: 5 mg via ORAL

## 2024-02-09 MED ORDER — CEFAZOLIN SODIUM-DEXTROSE 2-4 GM/100ML-% IV SOLN
2.0000 g | Freq: Once | INTRAVENOUS | Status: AC
  Administered 2024-02-09: 2 g via INTRAVENOUS

## 2024-02-09 MED ORDER — CHLORHEXIDINE GLUCONATE 0.12 % MT SOLN
15.0000 mL | Freq: Once | OROMUCOSAL | Status: AC
  Administered 2024-02-09: 15 mL via OROMUCOSAL

## 2024-02-09 MED ORDER — ORAL CARE MOUTH RINSE: 15.0000 mL | Freq: Once | OROMUCOSAL | Status: AC

## 2024-02-09 MED ORDER — POLYETHYLENE GLYCOL 3350 17 GM/SCOOP PO POWD
17.0000 g | Freq: Every day | ORAL | 0 refills | Status: AC | PRN
  Filled 2024-02-09: qty 238, 14d supply, fill #0

## 2024-02-09 MED ORDER — KETAMINE HCL 50 MG/5ML IJ SOSY
PREFILLED_SYRINGE | INTRAMUSCULAR | Status: DC | PRN
  Administered 2024-02-09: 10 mg via INTRAVENOUS
  Administered 2024-02-09: 20 mg via INTRAVENOUS

## 2024-02-09 MED ORDER — METHOCARBAMOL 500 MG PO TABS
500.0000 mg | ORAL_TABLET | Freq: Four times a day (QID) | ORAL | 0 refills | Status: AC | PRN
  Filled 2024-02-09: qty 120, 30d supply, fill #0

## 2024-02-09 MED ORDER — METHYLPREDNISOLONE 4 MG PO TBPK
ORAL_TABLET | ORAL | 0 refills | Status: AC
  Filled 2024-02-09: qty 21, 6d supply, fill #0

## 2024-02-09 MED ORDER — ONDANSETRON HCL 4 MG/2ML IJ SOLN
INTRAMUSCULAR | Status: DC | PRN
  Administered 2024-02-09: 4 mg via INTRAVENOUS

## 2024-02-09 MED ORDER — SUCCINYLCHOLINE CHLORIDE 200 MG/10ML IV SOSY
PREFILLED_SYRINGE | INTRAVENOUS | Status: AC
  Filled 2024-02-09: qty 10

## 2024-02-09 MED ORDER — SENNA 8.6 MG PO TABS
1.0000 | ORAL_TABLET | Freq: Two times a day (BID) | ORAL | 0 refills | Status: AC | PRN
  Filled 2024-02-09: qty 30, 15d supply, fill #0

## 2024-02-09 MED ORDER — FENTANYL CITRATE (PF) 100 MCG/2ML IJ SOLN
INTRAMUSCULAR | Status: DC | PRN
  Administered 2024-02-09 (×2): 50 ug via INTRAVENOUS

## 2024-02-09 MED ORDER — INSULIN ASPART 100 UNIT/ML IJ SOLN
5.0000 [IU] | Freq: Once | INTRAMUSCULAR | Status: AC
  Administered 2024-02-09: 5 [IU] via SUBCUTANEOUS

## 2024-02-09 MED ORDER — LIDOCAINE HCL (PF) 2 % IJ SOLN
INTRAMUSCULAR | Status: AC
  Filled 2024-02-09: qty 5

## 2024-02-09 MED ORDER — MIDAZOLAM HCL 2 MG/2ML IJ SOLN
INTRAMUSCULAR | Status: AC
  Filled 2024-02-09: qty 2

## 2024-02-09 SURGICAL SUPPLY — 37 items
ALLOGRAFT BONE FIBER KORE 1CC (Bone Implant) IMPLANT
BASIN KIT SINGLE STR (MISCELLANEOUS) ×1 IMPLANT
BUR NEURO DRILL SOFT 3.0X3.8M (BURR) ×1 IMPLANT
DERMABOND ADVANCED .7 DNX12 (GAUZE/BANDAGES/DRESSINGS) ×1 IMPLANT
DRAIN CHANNEL JP 10F RND 20C F (MISCELLANEOUS) IMPLANT
DRAPE C ARM PK CFD 31 SPINE (DRAPES) ×1 IMPLANT
DRAPE LAPAROTOMY 77X122 PED (DRAPES) ×1 IMPLANT
DRAPE SPINE LEICA/WILD 54X150 (DRAPES) ×1 IMPLANT
DRSG TEGADERM 4X4.75 (GAUZE/BANDAGES/DRESSINGS) IMPLANT
ELECTRODE REM PT RTRN 9FT ADLT (ELECTROSURGICAL) ×1 IMPLANT
EVACUATOR SILICONE 100CC (DRAIN) IMPLANT
FEE INTRAOP CADWELL SUPPLY NCS (MISCELLANEOUS) IMPLANT
FEE INTRAOP MONITOR IMPULS NCS (MISCELLANEOUS) IMPLANT
GAUZE SPONGE 2X2 STRL 8-PLY (GAUZE/BANDAGES/DRESSINGS) IMPLANT
GLOVE BIOGEL PI IND STRL 6.5 (GLOVE) ×1 IMPLANT
GLOVE SURG SYN 6.5 PF PI (GLOVE) ×1 IMPLANT
GLOVE SURG SYN 8.5 PF PI (GLOVE) ×3 IMPLANT
GOWN SRG LRG LVL 4 IMPRV REINF (GOWNS) ×1 IMPLANT
GOWN SRG XL LVL 3 NONREINFORCE (GOWNS) ×1 IMPLANT
KIT TURNOVER KIT A (KITS) ×1 IMPLANT
MANIFOLD NEPTUNE II (INSTRUMENTS) ×1 IMPLANT
NS IRRIG 500ML POUR BTL (IV SOLUTION) ×1 IMPLANT
PACK LAMINECTOMY ARMC (PACKS) ×1 IMPLANT
PAD ARMBOARD POSITIONER FOAM (MISCELLANEOUS) ×2 IMPLANT
PIN CASPAR 14 (PIN) ×1 IMPLANT
PLATE ACP 1.6X36 2LVL (Plate) IMPLANT
SCREW ACP 3.5X17 S/D VARIA (Screw) IMPLANT
SPACER C HEDRON 12X14 7M 7D (Spacer) IMPLANT
SPONGE KITTNER 5P (MISCELLANEOUS) ×1 IMPLANT
STAPLER SKIN PROX 35W (STAPLE) IMPLANT
SURGIFLO W/THROMBIN 8M KIT (HEMOSTASIS) ×1 IMPLANT
SUT STRATA 3-0 15 PS-2 (SUTURE) ×1 IMPLANT
SUT VIC AB 3-0 SH 8-18 (SUTURE) ×1 IMPLANT
SUTURE EHLN 3-0 FS-10 30 BLK (SUTURE) IMPLANT
SYR 20ML LL LF (SYRINGE) ×1 IMPLANT
TAPE CLOTH 3X10 WHT NS LF (GAUZE/BANDAGES/DRESSINGS) ×2 IMPLANT
TRAP FLUID SMOKE EVACUATOR (MISCELLANEOUS) ×1 IMPLANT

## 2024-02-09 NOTE — Discharge Instructions (Addendum)
 Your surgeon has performed an operation on your cervical spine (neck) to relieve pressure on the spinal cord and/or nerves. This involved making an incision in the front of your neck and removing one or more of the discs that support your spine. Next, a small piece of bone, a titanium plate, and screws were used to fuse two or more of the vertebrae (bones) together.  The following are instructions to help in your recovery once you have been discharged from the hospital. Even if you feel well, it is important that you follow these activity guidelines. If you do not let your neck heal properly from the surgery, you can increase the chance of return of your symptoms and other complications.  * Do not take anti-inflammatory medications for 3 months after surgery (naproxen [Aleve], ibuprofen [Advil, Motrin], etc.). These medications can prevent your bones from healing properly.  Celebrex , if prescribed, is ok to take. Please hold while taking steroid taper.   *Regarding compression stockings-  Please wear day and night until you are walking a couple hundred feet three times a day.   Activity    No bending, lifting, or twisting ("BLT"). Avoid lifting objects heavier than 10 pounds (gallon milk jug).  Where possible, avoid household activities that involve lifting, bending, reaching, pushing, or pulling such as laundry, vacuuming, grocery shopping, and childcare. Try to arrange for help from friends and family for these activities while your back heals.  Increase physical activity slowly as tolerated.  Taking short walks is encouraged, but avoid strenuous exercise. Do not jog, run, bicycle, lift weights, or participate in any other exercises unless specifically allowed by your doctor.  Talk to your doctor before resuming sexual activity.  You should not drive until cleared by your doctor.  Until released by your doctor, you should not return to work or school.  You should rest at home and let your  body heal.   You may shower three days after your surgery.  After showering, lightly dab your incision dry. Do not take a tub bath or go swimming until approved by your doctor at your follow-up appointment.  If your doctor ordered a cervical collar (neck brace) for you, you should wear it whenever you are out of bed. You may remove it when lying down or sleeping, but you should wear it at all other times. Not all neck surgeries require a cervical collar.  If you smoke, we strongly recommend that you quit.  Smoking has been proven to interfere with normal bone healing and will dramatically reduce the success rate of your surgery. Please contact [facility] (800-QUIT-NOW) and use the resources at www.[facility].com for assistance in stopping smoking.  Surgical Incision    Keep your incision area clean and dry.  You do not have staples or stitches, your incision was closed with Dermabond glue. The glue should begin to peel away within about a week  Diet           You may return to your usual diet. However, you may experience discomfort when swallowing in the first month after your surgery. This is normal. You may find that softer foods are more comfortable for you to swallow. Be sure to stay hydrated.  When to Contact Us   You may experience pain in your neck and/or pain between your shoulder blades. This is normal and should improve in the next few weeks with the help of pain medication, muscle relaxers, and rest. Some patients report that a warm compress on the  back of the neck or between the shoulder blades helps.  However, should you experience any of the following, contact us  immediately: New numbness or weakness Pain that is progressively getting worse, and is not relieved by your pain medication, muscle relaxers, rest, and warm compresses Bleeding, redness, swelling, pain, or drainage from surgical incision Chills or flu-like symptoms Fever greater than 101.0 F (38.3 C) Inability to eat,  drink fluids, or take medications Problems with bowel or bladder functions Difficulty breathing or shortness of breath Warmth, tenderness, or swelling in your calf Contact Information How to contact us :  If you have any questions/concerns before or after surgery, you can reach us  at [phone], or you can send a mychart message. We can be reached by phone or mychart 8am-4pm, Monday-Friday.  *Please note: Calls after 4pm are forwarded to a third party answering service. Mychart messages are not routinely monitored during evenings, weekends, and holidays. Please call our office to contact the answering service for urgent concerns during non-business hours.

## 2024-02-09 NOTE — Anesthesia Procedure Notes (Signed)
 Procedure Name: Intubation Date/Time: 02/09/2024 7:23 AM  Performed by: Lorriane Arabia, CRNAPre-anesthesia Checklist: Patient identified, Patient being monitored, Timeout performed, Emergency Drugs available and Suction available Patient Re-evaluated:Patient Re-evaluated prior to induction Oxygen Delivery Method: Circle system utilized Preoxygenation: Pre-oxygenation with 100% oxygen Induction Type: IV induction Ventilation: Mask ventilation without difficulty Laryngoscope Size: 3 and McGrath Grade View: Grade I Tube type: Oral Tube size: 7.0 mm Number of attempts: 1 Airway Equipment and Method: Stylet Placement Confirmation: ETT inserted through vocal cords under direct vision, positive ETCO2 and breath sounds checked- equal and bilateral Secured at: 22 cm Tube secured with: Tape Dental Injury: Teeth and Oropharynx as per pre-operative assessment

## 2024-02-09 NOTE — Anesthesia Postprocedure Evaluation (Signed)
 Anesthesia Post Note  Patient: Taylor [name]  Procedure(s) Performed: ANTERIOR CERVICAL DECOMPRESSION/DISCECTOMY FUSION 2 LEVELS (Neck)  Patient location during evaluation: PACU Anesthesia Type: General Level of consciousness: awake and alert Pain management: pain level controlled Vital Signs Assessment: post-procedure vital signs reviewed and stable Respiratory status: spontaneous breathing, nonlabored ventilation and respiratory function stable Cardiovascular status: blood pressure returned to baseline and stable Postop Assessment: no apparent nausea or vomiting Anesthetic complications: no   No notable events documented.   Last Vitals:  Vitals:   02/09/24 1122 02/09/24 1335  BP: 122/88 (!) 162/85  Pulse: 83 81  Resp: 16 14  Temp: 36.7 C 36.7 C  SpO2: 91% 95%    Last Pain:  Vitals:   02/09/24 1335  TempSrc: Temporal  PainSc: 2                  [name]

## 2024-02-09 NOTE — H&P (Signed)
 Referring Physician:  Clois Fret, MD [address] [place],  [place] [address]  Primary Physician:  Auston Reyes BIRCH, MD  History of Present Illness: 02/09/2024 Ms. Yoon presents today with continued symptoms.  10/09/2023 Ms. Toniya Lacosse is here today with a chief complaint of chronic neck pain and diabetes who presents with arm pain and numbness.   She experiences pain radiating from her neck to her fingers, with the most severe pain between her neck and elbows. Numbness and tingling extend to all fingertips. She has had neck pain for 14 years, linked to previous back issues. A history of shoulder surgery provided temporary relief, but pain returned, possibly due to physical strain. She underwent a low back fusion in 2007. Physical therapy was attempted without benefit. An MRI from November was part of her workup.  She has difficulty swallowing, with episodes of feeling something stuck in her throat, even when drinking water. A swallowing evaluation was unremarkable. Bowel/Bladder Dysfunction: none  Conservative measures:  Physical therapy:  has participated at [facility] 1 time Multimodal medical therapy including regular antiinflammatories:  Robaxin , naproxen, Lyrica  and tramadol   Injections:  06/03/2023: Left C5-6 transforaminal ESI (3-4 weeks of relief, dexamethasone  8 mg, hemoglobin A1c of 8.6)  03/21/2023: Left C5-6 transforaminal ESI (4 weeks of relief then return of pain)   Past Surgery:  2 level fusion back surgery-year? Who? 02/14/2022-SHOULDER ARTHROSCOPY WITH SUBACROMIAL DECOMPRESSION, ROTATOR CUFF REPAIR AND BICEP TENDON REPAIR   Maripaz B Mcnorton has no symptoms of cervical myelopathy.  The symptoms are causing a significant impact on the patient's life.   I have utilized the care everywhere function in epic to review the outside records available from external health systems.  Review of Systems:  A 10 point review of systems is  negative, except for the pertinent positives and negatives detailed in the HPI.  Past Medical History: Past Medical History:  Diagnosis Date   Anxiety    a.) on BZO (clonazepam ) PRN   Aortic atherosclerosis    Arthritis    B12 deficiency    Bilateral carotid artery disease    Cervical stenosis of spine    Chronic back pain    COPD (chronic obstructive pulmonary disease) (HCC)    Coronary artery calcification seen on CT scan    DDD (degenerative disc disease), lumbar    a.) s/p L4-5 and L5-S1 fusion   Depression    Diastolic dysfunction 09/01/2019   a.) TTE 09/01/2019: >55%; mild LA enlargement; triv PR, mild MR/TR; G1DD.   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   Morbid obesity (HCC)    OSA (obstructive sleep apnea)    Has not picked up cpap yet as of 01-26-24 due to cost   Pneumonia    Psoriasis    Psoriatic arthritis (HCC)    a.) will not start  ustekinumab (Stelara) until after cervical spine surgery   PSVT (paroxysmal supraventricular tachycardia)    Radiculopathy of cervical spine    Right  carpal tunnel syndrome s/p release    Sensory polyneuropathy    T2DM (type 2 diabetes mellitus) (HCC)    Thyroid  nodule     Past Surgical History: Past Surgical History:  Procedure Laterality Date   ABDOMINAL HYSTERECTOMY     BACK SURGERY     2 level fusion   BREAST BIOPSY Left 08/23/2013   stereo, negative   BUNIONECTOMY Bilateral    carpal tunnel Right    CESAREAN SECTION     x1   COLONOSCOPY N/A 11/06/2023   Procedure: COLONOSCOPY;   Surgeon: Onita Elspeth Sharper, DO;  Location: [facility] ENDOSCOPY;  Service: Endoscopy;  Laterality: N/A;   COLONOSCOPY WITH ESOPHAGOGASTRODUODENOSCOPY (EGD)     CYST EXCISION     lower back   DILATION AND CURETTAGE OF UTERUS     ESOPHAGOGASTRODUODENOSCOPY N/A 11/06/2023   Procedure: EGD (ESOPHAGOGASTRODUODENOSCOPY);  Surgeon: Onita Elspeth Sharper, DO;  Location: [facility] ENDOSCOPY;  Service: Endoscopy;  Laterality: N/A;   KNEE ARTHROPLASTY Left 12/10/2023   Procedure: ARTHROPLASTY, KNEE, TOTAL, USING IMAGELESS COMPUTER-ASSISTED NAVIGATION;  Surgeon: Mardee Lynwood SQUIBB, MD;  Location: [facility] ORS;  Service: Orthopedics;  Laterality: Left;   KNEE ARTHROSCOPY Left 05/16/2021   Procedure: ARTHROSCOPY KNEE;  Surgeon: Mardee Lynwood SQUIBB, MD;  Location: [facility] ORS;  Service: Orthopedics;  Laterality: Left;   MALONEY DILATION  11/06/2023   Procedure: DILATION, ESOPHAGUS, USING MALONEY DILATOR;  Surgeon: Onita Elspeth Sharper, DO;  Location: [facility] ENDOSCOPY;  Service: Endoscopy;;   POLYPECTOMY  11/06/2023   Procedure: POLYPECTOMY, INTESTINE;  Surgeon: Onita Elspeth Sharper, DO;  Location: [facility] ENDOSCOPY;  Service: Endoscopy;;   SHOULDER ARTHROSCOPY WITH SUBACROMIAL DECOMPRESSION, ROTATOR CUFF REPAIR AND BICEP TENDON REPAIR Left 02/14/2022   Procedure: SHOULDER ARTHROSCOPY WITH DEBRIDEMENT, DECOMPRESSION, ROTATOR CUFF REPAIR AND BICEPS TENODESIS.;  Surgeon: Edie Norleen PARAS, MD;  Location: [facility] ORS;  Service: Orthopedics;  Laterality: Left;   tendinitis Right    wrist   TONSILLECTOMY     TRIGGER FINGER RELEASE Right    x 2 thumb and middle finger    Allergies: Allergies as of 12/17/2023 - Review Complete 12/10/2023  Allergen Reaction Noted   Aspirin  Hives and Swelling 10/04/2013   Penicillins Hives 10/04/2013    Medications:  Current Facility-Administered Medications:    0.9 %  sodium chloride  infusion, , Intravenous, Continuous, Piscitello, Fairy POUR, MD   ceFAZolin  (ANCEF ) IVPB 2g/100 mL premix, 2 g,  Intravenous, Once, Clois Fret, MD  Social History: Social History   Tobacco Use   Smoking status: Former    Current packs/day: 0.00    Types: Cigarettes    Quit date: 03/11/2016    Years since quitting: 7.9   Smokeless tobacco: Never  Vaping Use   Vaping status: Former  Substance Use Topics   Alcohol use: Not Currently    Comment: last drink in 2023   Drug use: No    Family Medical History: Family History  Problem Relation Age of Onset   Breast cancer Mother 64    Physical Examination: Vitals:   02/09/24 0621  BP: 122/72  Pulse: 88  Resp: 17  Temp: 98.1 F (36.7 C)  SpO2: 97%   Heart sounds normal no MRG. Chest Clear to Auscultation Bilaterally.  General: Patient is in no apparent distress. Attention to examination is appropriate.  Neck:   Supple.  Full range of motion.  Respiratory: Patient is breathing without any difficulty.   NEUROLOGICAL:     Awake, alert, oriented to person, place, and time.  Speech is clear and  fluent.   Cranial Nerves: Pupils equal round and reactive to light.  Facial tone is symmetric.  Facial sensation is symmetric. Shoulder shrug is symmetric. Tongue protrusion is midline.  There is no pronator drift.  Strength: Side Biceps Triceps Deltoid Interossei Grip Wrist Ext. Wrist Flex.  R 5 5 5 5 5 5 5   L 5 5 5 5 5 5 5    Side Iliopsoas Quads Hamstring PF DF EHL  R 5 5 5 5 5 5   L 5 5 5 5 5 5    Reflexes are 1+ and symmetric at the biceps, triceps, brachioradialis, patella and achilles.   Hoffman's is absent.   Bilateral upper and lower extremity sensation is intact to light touch.    No evidence of dysmetria noted.  Gait is untested.    Medical Decision Making  Imaging: MRI C spine 02/03/2023 IMPRESSION: 1. Multilevel cervical spondylosis with resultant mild diffuse spinal stenosis at C3-4 through C6-7. 2. Multifactorial degenerative changes with resultant multilevel foraminal narrowing as above. Notable findings  include severe left C4 foraminal stenosis, moderate bilateral C5 foraminal narrowing, severe left with moderate right C6 foraminal stenosis, with mild to moderate left C7 and T1-2 foraminal narrowing.     Electronically Signed   By: Morene Hoard M.D.   On: 02/08/2023 01:06    I have personally reviewed the images and agree with the above interpretation.  Assessment and Plan: Ms. Westmoreland is a pleasant 63 y.o. female with cervical radiculopathy due to cervical stenosis.  She has severe nerve root compression due to cervical stenosis.  We will proceed with anterior cervical discectomy and fusion at C4-6. Risks and benefits previously reviewed in clinic.      Tiare Rohlman K. Clois MD, [facility] Neurosurgery

## 2024-02-09 NOTE — Transfer of Care (Signed)
 Immediate Anesthesia Transfer of Care Note  Patient: Taylor [name]  Procedure(s) Performed: ANTERIOR CERVICAL DECOMPRESSION/DISCECTOMY FUSION 2 LEVELS (Neck)  Patient Location: PACU  Anesthesia Type:General  Level of Consciousness: drowsy and patient cooperative  Airway & Oxygen Therapy: Patient Spontanous Breathing and Patient connected to face mask oxygen  Post-op Assessment: Report given to RN and Post -op Vital signs reviewed and stable  Post vital signs: Reviewed and stable  Last Vitals:  Vitals Value Taken Time  BP 155/77 02/09/24 09:34  Temp 36.8 C 02/09/24 09:34  Pulse 92 02/09/24 09:41  Resp 18 02/09/24 09:41  SpO2 96 % 02/09/24 09:41  Vitals shown include unfiled device data.  Last Pain:  Vitals:   02/09/24 0934  TempSrc:   PainSc: Asleep         Complications: No notable events documented.

## 2024-02-09 NOTE — Discharge Summary (Signed)
 Discharge Summary  Patient ID: Taylor [name] MRN: [id number] DOB/AGE: [birth year] 63 y.o.  Admit date: 02/09/2024 Discharge date: 02/09/2024  Admission Diagnoses: M54.12 Cervical radiculopathy, M48.02 Cervical stenosis of spinal canal  Discharge Diagnoses:  Active Problems:   Cervical radiculopathy   Cervical stenosis of spinal canal   Discharged Condition: good  Hospital Course:  Brooklyn Pangallo is a 63 y.o presenting with cervical radiculopathy s/p C4-6 ACDF. Her intraoperative course was uncomplicated. She was monitored in PACU and discharged home after ambulating, urinating, and tolerating PO intake. She was given prescriptions for Oxycodone  to take for pain refractory to home Tramadol , Robaxin, Senna, and Miralax. She was also given a MDP given her previous history of dysphagia and the risk of dysphagia with this surgery.   Consults: None  Significant Diagnostic Studies: NA  Treatments: surgery: as above. Please see separately dictated operative report for further details   Discharge Exam: Blood pressure 122/72, pulse 88, temperature 98.1 F (36.7 C), temperature source Temporal, resp. rate 17, SpO2 97%. CN grossly intact NAEW Incision c/d/I with dermabond in place  Disposition: Discharge disposition: 01-Home or Self Care        Allergies as of 02/09/2024       Reactions   Aspirin  Hives, Swelling   Penicillins Hives   IgE = 168 (WNL) on 12/03/2023        Medication List     PAUSE taking these medications    celecoxib  200 MG capsule Wait to take this until your doctor or other care provider tells you to start again. Commonly known as: CELEBREX  Take 1 capsule (200 mg total) by mouth 2 (two) times daily.   clopidogrel  75 MG tablet Wait to take this until your doctor or other care provider tells you to start again. Commonly known as: PLAVIX  Take 75 mg by mouth daily.       TAKE these medications    Advair HFA 115-21 MCG/ACT inhaler Generic drug:  fluticasone -salmeterol Inhale 2 puffs into the lungs 2 (two) times daily.   albuterol  108 (90 Base) MCG/ACT inhaler Commonly known as: VENTOLIN  HFA Inhale 1-2 puffs into the lungs every 6 (six) hours as needed for wheezing or shortness of breath.   bisoprolol -hydrochlorothiazide  10-6.25 MG tablet Commonly known as: ZIAC  Take 1 tablet by mouth at bedtime.   buPROPion  150 MG 24 hr tablet Commonly known as: WELLBUTRIN  XL Take 150 mg by mouth at bedtime.   busPIRone  10 MG tablet Commonly known as: BUSPAR  Take 10 mg by mouth 2 (two) times daily.   clonazePAM  0.5 MG tablet Commonly known as: KLONOPIN  Take 0.5 mg by mouth 2 (two) times daily as needed for anxiety.   cyanocobalamin 1000 MCG/ML injection Commonly known as: VITAMIN B12 Inject 1,000 mcg into the muscle every 30 (thirty) days.   cyanocobalamin 1000 MCG tablet Commonly known as: VITAMIN B12 Take 1,000 mcg by mouth daily.   escitalopram  20 MG tablet Commonly known as: LEXAPRO  Take 20 mg by mouth at bedtime.   glimepiride  4 MG tablet Commonly known as: AMARYL  Take 4 mg by mouth in the morning and at bedtime.   levothyroxine  50 MCG tablet Commonly known as: SYNTHROID  Take 75 mcg by mouth daily before breakfast.   methocarbamol 500 MG tablet Commonly known as: ROBAXIN Take 1 tablet (500 mg total) by mouth every 6 (six) hours as needed for muscle spasms.   methylPREDNISolone  4 MG Tbpk tablet Commonly known as: MEDROL  DOSEPAK Take 6 tablets (24 mg total) by mouth daily for  1 day, THEN 5 tablets (20 mg total) daily for 1 day, THEN 4 tablets (16 mg total) daily for 1 day, THEN 3 tablets (12 mg total) daily for 1 day, THEN 2 tablets (8 mg total) daily for 1 day, THEN 1 tablet (4 mg total) daily for 1 day. Start taking on: February 09, 2024   nortriptyline  25 MG capsule Commonly known as: PAMELOR  Take 25 mg by mouth at bedtime.   oxyCODONE  5 MG immediate release tablet Commonly known as: Roxicodone  Take 1 tablet (5  mg total) by mouth every 4 (four) hours as needed for up to 5 days for severe pain (pain score 7-10) (refractory to home Tramadol ). What changed: reasons to take this   polyethylene glycol 17 g packet Commonly known as: MiraLax Take 17 g by mouth daily as needed for moderate constipation.   pregabalin  200 MG capsule Commonly known as: LYRICA  Take 200 mg by mouth 2 (two) times daily.   rosuvastatin  40 MG tablet Commonly known as: CRESTOR  Take 40 mg by mouth daily.   senna 8.6 MG Tabs tablet Commonly known as: SENOKOT Take 1 tablet (8.6 mg total) by mouth 2 (two) times daily as needed.   Stelara 90 MG/ML Sosy injection Generic drug: ustekinumab Inject 90 mg into the skin every 8 (eight) weeks.   torsemide  20 MG tablet Commonly known as: DEMADEX  Take 20 mg by mouth daily as needed (fluid).   traMADol  50 MG tablet Commonly known as: ULTRAM  Take 2 tablets (100 mg total) by mouth 2 (two) times daily as needed for moderate pain (pain score 4-6).   Vitamin D (Ergocalciferol) 1.25 MG (50000 UNIT) Caps capsule Commonly known as: DRISDOL Take 50,000 Units by mouth every Sunday.         Signed: Edsel Jama Goods 02/09/2024, 9:31 AM

## 2024-02-09 NOTE — Progress Notes (Signed)
 Subjective:    Patient ID: Taylor [name], female    DOB: 03-Apr-1960, 63 y.o.   MRN: [id number] Chief Complaint  Patient presents with   Establish Care    New patient Edema of left LE ref. Hooten     HPI  Discussed the use of AI scribe software for clinical note transcription with the patient, who gave verbal consent to proceed.  History of Present Illness Taylor [name] is a 63 year old female who presents with leg swelling following knee surgery. She was referred by Dr. Hooten for evaluation of leg swelling post knee surgery.  She has experienced leg swelling that began prior to her knee surgery, initially affecting her ankle. Post-surgery, the swelling has extended from her toes up to mid-thigh. The swelling is persistent, improving slightly during the day but returning by evening.  She has been taking diuretics since Friday, Saturday, and Sunday, but they have not alleviated the swelling. She also uses thigh-high compression stockings, which are extremely tight and sometimes painful, requiring her to remove them. She elevates and ices her leg regularly.  She is no longer attending physical therapy as she is scheduled for neck surgery on Monday. Additionally, she was supposed to receive a shot for her leg on January 7th, which was delayed due to the swelling.    Results RADIOLOGY Venous Doppler Ultrasound: No blood clots  DIAGNOSTIC No venous reflux was noted   Review of Systems  Cardiovascular:  Positive for leg swelling.  Musculoskeletal:  Positive for arthralgias.  All other systems reviewed and are negative.      Objective:   Physical Exam Vitals reviewed.  HENT:     Head: Normocephalic.  Cardiovascular:     Rate and Rhythm: Normal rate.     Pulses: Normal pulses.  Pulmonary:     Effort: Pulmonary effort is normal.  Skin:    General: Skin is warm and dry.  Neurological:     Mental Status: She is alert and oriented to person, place, and time.  Psychiatric:         Mood and Affect: Mood normal.        Behavior: Behavior normal.        Thought Content: Thought content normal.        Judgment: Judgment normal.    BP 113/80 (BP Location: Left Arm, Patient Position: Sitting, Cuff Size: Large)   Pulse 85   Resp 18   Ht 5' 4 (1.626 m)   Wt 253 lb 6.4 oz (114.9 kg)   BMI 43.50 kg/m   Past Medical History:  Diagnosis Date   Anxiety    a.) on BZO (clonazepam ) PRN   Aortic atherosclerosis    Arthritis    B12 deficiency    Bilateral carotid artery disease    Cervical stenosis of spine    Chronic back pain    COPD (chronic obstructive pulmonary disease) (HCC)    Coronary artery calcification seen on CT scan    DDD (degenerative disc disease), lumbar    a.) s/p L4-5 and L5-S1 fusion   Depression    Diastolic dysfunction 09/01/2019   a.) TTE 09/01/2019: >55%; mild LA enlargement; triv PR, mild MR/TR; G1DD.   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   a.) IgG lambda MGUS   Morbid obesity (HCC)    OSA (obstructive sleep apnea)    Has not picked up cpap yet as of 01-26-24 due to cost   Pneumonia    Psoriasis    Psoriatic arthritis (HCC)    a.) will not start  ustekinumab (Stelara) until after cervical spine surgery   PSVT (paroxysmal supraventricular tachycardia)    Radiculopathy of cervical spine    Right carpal tunnel syndrome s/p release    Sensory polyneuropathy    T2DM (type 2 diabetes mellitus) (HCC)    Thyroid  nodule     Social History   Socioeconomic History   Marital status: Married    Spouse name: Not on file    Number of children: Not on file   Years of education: Not on file   Highest education level: Not on file  Occupational History   Not on file  Tobacco Use   Smoking status: Former    Current packs/day: 0.00    Types: Cigarettes    Quit date: 03/11/2016    Years since quitting: 7.9   Smokeless tobacco: Never  Vaping Use   Vaping status: Former  Substance and Sexual Activity   Alcohol use: Not Currently    Comment: last drink in 2023   Drug use: No   Sexual activity: Not Currently  Other Topics Concern   Not on file  Social History Narrative   Lives at home with husband   Social Drivers of Health   Financial Resource Strain: Medium Risk (01/20/2024)   Received from [facility]   Overall Financial Resource Strain (CARDIA)    Difficulty of Paying Living Expenses: Somewhat hard  Food Insecurity: No Food Insecurity (01/20/2024)   Received from [facility]   Hunger Vital Sign    Within the past 12 months, you worried that your food would run out before you got the money to buy more.: Never true    Within the past 12 months, the food you bought just didn't last and you didn't have money to get more.: Never true  Transportation Needs: No Transportation Needs (01/20/2024)   Received from [facility] - Transportation    In the past 12 months, has lack of transportation kept you from medical appointments or from getting medications?: No    Lack of Transportation (Non-Medical): No  Physical Activity: Not on file  Stress: Not on file  Social Connections: Not on file  Intimate Partner Violence: Not At Risk (12/10/2023)   Humiliation, Afraid, Rape, and Kick questionnaire    Fear of Current or Ex-Partner: No    Emotionally Abused: No    Physically Abused: No    Sexually Abused: No    Past Surgical History:  Procedure Laterality Date   ABDOMINAL HYSTERECTOMY     BACK SURGERY     2 level fusion   BREAST BIOPSY Left  08/23/2013   stereo, negative   BUNIONECTOMY Bilateral    carpal tunnel Right    CESAREAN SECTION     x1   COLONOSCOPY N/A 11/06/2023   Procedure: COLONOSCOPY;  Surgeon: Onita Elspeth Sharper, DO;  Location: [facility] ENDOSCOPY;  Service: Endoscopy;  Laterality: N/A;   COLONOSCOPY WITH ESOPHAGOGASTRODUODENOSCOPY (EGD)  CYST EXCISION     lower back   DILATION AND CURETTAGE OF UTERUS     ESOPHAGOGASTRODUODENOSCOPY N/A 11/06/2023   Procedure: EGD (ESOPHAGOGASTRODUODENOSCOPY);  Surgeon: Onita Elspeth Sharper, DO;  Location: [facility] ENDOSCOPY;  Service: Endoscopy;  Laterality: N/A;   KNEE ARTHROPLASTY Left 12/10/2023   Procedure: ARTHROPLASTY, KNEE, TOTAL, USING IMAGELESS COMPUTER-ASSISTED NAVIGATION;  Surgeon: Mardee Lynwood SQUIBB, MD;  Location: [facility] ORS;  Service: Orthopedics;  Laterality: Left;   KNEE ARTHROSCOPY Left 05/16/2021   Procedure: ARTHROSCOPY KNEE;  Surgeon: Mardee Lynwood SQUIBB, MD;  Location: [facility] ORS;  Service: Orthopedics;  Laterality: Left;   MALONEY DILATION  11/06/2023   Procedure: DILATION, ESOPHAGUS, USING MALONEY DILATOR;  Surgeon: Onita Elspeth Sharper, DO;  Location: [facility] ENDOSCOPY;  Service: Endoscopy;;   POLYPECTOMY  11/06/2023   Procedure: POLYPECTOMY, INTESTINE;  Surgeon: Onita Elspeth Sharper, DO;  Location: [facility] ENDOSCOPY;  Service: Endoscopy;;   SHOULDER ARTHROSCOPY WITH SUBACROMIAL DECOMPRESSION, ROTATOR CUFF REPAIR AND BICEP TENDON REPAIR Left 02/14/2022   Procedure: SHOULDER ARTHROSCOPY WITH DEBRIDEMENT, DECOMPRESSION, ROTATOR CUFF REPAIR AND BICEPS TENODESIS.;  Surgeon: Edie Norleen PARAS, MD;  Location: [facility] ORS;  Service: Orthopedics;  Laterality: Left;   tendinitis Right    wrist   TONSILLECTOMY     TRIGGER FINGER RELEASE Right    x 2 thumb and middle finger    Family History  Problem Relation Age of Onset   Breast cancer Mother 54    Allergies  Allergen Reactions   Aspirin  Hives and Swelling   Penicillins Hives    IgE = 168 (WNL) on 12/03/2023       Latest  Ref Rng & Units 04/11/2023    1:45 PM 03/18/2022    8:49 AM 05/09/2021   10:10 AM  CBC  WBC 4.0 - 10.5 K/uL 6.3  5.2  6.9   Hemoglobin 12.0 - 15.0 g/dL 86.2  85.3  86.0   Hematocrit 36.0 - 46.0 % 41.1  43.3  43.5   Platelets 150 - 400 K/uL 240  195  193       CMP     Component Value Date/Time   NA 140 12/03/2023 0856   K 3.9 12/03/2023 0856   CL 103 12/03/2023 0856   CO2 26 12/03/2023 0856   GLUCOSE 146 (H) 12/03/2023 0856   BUN 14 12/03/2023 0856   CREATININE 0.65 12/03/2023 0856   CREATININE 0.57 04/11/2023 1345   CALCIUM  9.2 12/03/2023 0856   PROT 7.6 12/03/2023 0856   ALBUMIN 4.2 12/03/2023 0856   AST 30 12/03/2023 0856   AST 38 04/11/2023 1345   ALT 32 12/03/2023 0856   ALT 41 04/11/2023 1345   ALKPHOS 68 12/03/2023 0856   BILITOT 0.7 12/03/2023 0856   BILITOT 0.6 04/11/2023 1345   GFRNONAA >60 12/03/2023 0856   GFRNONAA >60 04/11/2023 1345     No results found.     Assessment & Plan:   1. Leg swelling (Primary) Postoperative leg swelling following knee surgery Swelling from ankle to mid-thigh likely due to postoperative healing and fluid retention. No blood clots or valve dysfunction. Compression stockings cause discomfort. Upcoming neck surgery may affect mobility and compression use. - Continue compression stockings, especially at night; consider knee-high if thigh-highs are too tight. - Elevate legs when inactive. - Continue physical therapy as tolerated. - Monitor swelling for three months. - Use compression stockings consistently, adjusting for comfort and mobility. - Consider lymphedema pump if swelling persists after three months.  2. Primary hypertension Continue antihypertensive medications as already  ordered, these medications have been reviewed and there are no changes at this time.     Current Outpatient Medications on File Prior to Visit  Medication Sig Dispense Refill   ADVAIR HFA 115-21 MCG/ACT inhaler Inhale 2 puffs into the lungs 2 (two)  times daily.     albuterol  (VENTOLIN  HFA) 108 (90 Base) MCG/ACT inhaler Inhale 1-2 puffs into the lungs every 6 (six) hours as needed for wheezing or shortness of breath.     bisoprolol -hydrochlorothiazide  (ZIAC ) 10-6.25 MG tablet Take 1 tablet by mouth at bedtime.     buPROPion  (WELLBUTRIN  XL) 150 MG 24 hr tablet Take 150 mg by mouth at bedtime.     busPIRone  (BUSPAR ) 10 MG tablet Take 10 mg by mouth 2 (two) times daily.     celecoxib  (CELEBREX ) 200 MG capsule Take 1 capsule (200 mg total) by mouth 2 (two) times daily. 60 capsule 1   clonazePAM  (KLONOPIN ) 0.5 MG tablet Take 0.5 mg by mouth 2 (two) times daily as needed for anxiety.     clopidogrel  (PLAVIX ) 75 MG tablet Take 75 mg by mouth daily.     cyanocobalamin (VITAMIN B12) 1000 MCG/ML injection Inject 1,000 mcg into the muscle every 30 (thirty) days.     escitalopram  (LEXAPRO ) 20 MG tablet Take 20 mg by mouth at bedtime.     glimepiride  (AMARYL ) 4 MG tablet Take 4 mg by mouth in the morning and at bedtime.     levothyroxine  (SYNTHROID ) 50 MCG tablet Take 75 mcg by mouth daily before breakfast.     nortriptyline  (PAMELOR ) 25 MG capsule Take 25 mg by mouth at bedtime.     pregabalin  (LYRICA ) 200 MG capsule Take 200 mg by mouth 2 (two) times daily.     rosuvastatin  (CRESTOR ) 40 MG tablet Take 40 mg by mouth daily.     STELARA 90 MG/ML SOSY injection Inject 90 mg into the skin every 8 (eight) weeks.     torsemide  (DEMADEX ) 20 MG tablet Take 20 mg by mouth daily as needed (fluid).     traMADol  (ULTRAM ) 50 MG tablet Take 2 tablets (100 mg total) by mouth 2 (two) times daily as needed for moderate pain (pain score 4-6). 30 tablet 0   vitamin B-12 (CYANOCOBALAMIN) 1000 MCG tablet Take 1,000 mcg by mouth daily.     Vitamin D, Ergocalciferol, (DRISDOL) 1.25 MG (50000 UNIT) CAPS capsule Take 50,000 Units by mouth every Sunday.     oxyCODONE  (OXY IR/ROXICODONE ) 5 MG immediate release tablet Take 1 tablet (5 mg total) by mouth every 4 (four) hours as  needed for moderate pain (pain score 4-6) (pain score 4-6). (Patient not taking: Reported on 02/03/2024) 30 tablet 0   No current facility-administered medications on file prior to visit.    There are no Patient Instructions on file for this visit. No follow-ups on file.   Lauretta Sallas E Shelisa Fern, NP

## 2024-02-09 NOTE — Progress Notes (Signed)
 Patient able to void, ambulate and tolerate po intake without difficulty.

## 2024-02-09 NOTE — Op Note (Signed)
 Indications: [name] is a 63 y.o. female with M54.12 Cervical radiculopathy, M48.02 Cervical stenosis of spinal canal. Due to ongoing symptoms and lack of reasonable conservative management options, the patient opted for surgical intervention.  Findings: stenosis, successful decompression  Preoperative Diagnosis: M54.12 Cervical radiculopathy, M48.02 Cervical stenosis of spinal canal  Postoperative Diagnosis: same   EBL: 25 ml IVF: see anesthesia record Drains: none Disposition: Extubated and Stable to PACU Complications: none  No foley catheter was placed.  Preoperative Note:   Risks of surgery discussed and documented in clinic note.  Operative Note:   Procedure:  1) Anterior cervical diskectomy and fusion at C4/5 and C5/6 2) Anterior cervical instrumentation at C4 - 6 3) Placement of biomechanical devices at C4/5 and C5/6  4) Use of operative microscope 5) Use of flouroscopy   Procedure: After obtaining informed consent, the patient taken to the operating room, placed in supine position, general anesthesia induced.  The patient had a small shoulder roll placed behind their shoulders.  The patient received preop antibiotics and IV Decadron .  A timeout was performed.  The patient had a neck incision outlined, was prepped and draped in usual sterile fashion. The incision was injected with local anesthetic.   An incision was opened, dissection taken down medial to the carotid artery and jugular vein, lateral to the trachea and esophagus.  The prevertebral fascia identified and a localizing x-ray demonstrated the correct level.  The longus colli were dissected laterally, and self-retaining retractors placed to open the operative field. The microscope was then brought into the field.  With this complete, distractor pins were placed in the vertebral bodies of C4 and C6. The distractor was placed, and the annuli at C4/5 and C5/6 were opened using a bovie.  Curettes and pituitary  rongeurs used to remove the majority of C4/5 disk, then the drill was used to remove the posterior osteophyte and begin the foraminotomies. The nerve hook was used to elevate the posterior longitudinal ligament, which was then removed with Kerrison rongeurs to complete decompression of the spinal cord. The Kerrison rongeurs were then used to complete the foraminotomies bilaterally to decompress the nerve roots. The nerve hook could be passed out each foramen, ensuring decompression of the nerve roots. Meticulous hemostasis was obtained. A biomechanical device (Globus Hedron C 7 mm height x 14 mm width by 12 mm depth) was placed at C4/5. The device had been filled with demineralized bone matrix for aid in arthrodesis.  Please note that the procedure included removal of the disc, removal of the posterior osteophytes, and removal of the posterior longitudinal ligament to ensure decompression of the spinal cord.  Additionally, foraminotomies were performed on both sides of the spinal canal to decompress the nerve roots.  We then moved to the C5/6 level. Curettes and pituitary rongeurs used to remove the majority of the disk, then the drill was used to remove the posterior osteophyte and begin the foraminotomies. The nerve hook was used to elevate the posterior longitudinal ligament, which was then removed with Kerrison rongeurs to complete decompression of the spinal cord. The Kerrison rongeurs were then used to complete the foraminotomies bilaterally to decompress the nerve roots. The nerve hook could be passed out each foramen, ensuring decompression of the nerve roots. Meticulous hemostasis was obtained. A biomechanical device (Globus Hedron C 7 mm height x 14 mm width by 12 mm depth) was placed at C5/6. The device had been filled with demineralized bone matrix for aid in arthrodesis.  Please note that the procedure included removal of the disc, removal of the posterior osteophytes, and removal of the posterior  longitudinal ligament to ensure decompression of the spinal cord.  Additionally, foraminotomies were performed on both sides of the spinal canal to decompress the nerve roots.  The caspar distractor was removed, and bone wax used for hemostasis. A separate, 36 mm 3 segment Nuvasive ACP plate was chosen to bridge the 3 segments.  Two screws placed in each vertebral body, respectively making sure the screws were behind the locking mechanism.  Final AP and lateral radiographs were taken.   Please note that the plate is not inclusive to the biomechanical devices.  The anchoring mechanism of the plate is completely separate from the biomechanical devices.  With everything in good position, the wound was irrigated copiously and meticulous hemostasis obtained.  Wound was closed in 2 layers using interrupted inverted 3-0 Vicryl sutures in the platysma and 3-0 monocryl on the dermis.  The wound was dressed with dermabond, the head of bed at 30 degrees, taken to recovery room in stable condition.  No new postop neurological deficits were identified.  Sponge and pattie counts were correct at the end of the procedure.     I performed the entire procedure with the assistance of Edsel Goods PA as an designer, television/film set. An assistant was required for this procedure due to the complexity.  The assistant provided assistance in tissue manipulation and suction, and was required for the successful and safe performance of the procedure. I performed the critical portions of the procedure.   Reeves Daisy MD

## 2024-02-09 NOTE — Anesthesia Preprocedure Evaluation (Addendum)
 Anesthesia Evaluation  Patient identified by MRN, date of birth, ID band Patient awake    Reviewed: Allergy & Precautions, NPO status , Patient's Chart, lab work & pertinent test results  History of Anesthesia Complications Negative for: history of anesthetic complications  Airway Mallampati: IV  TM Distance: >3 FB Neck ROM: full    Dental  (+) Dental Advidsory Given, Edentulous Upper   Pulmonary neg shortness of breath, sleep apnea , COPD, neg recent URI, former smoker   Pulmonary exam normal        Cardiovascular Exercise Tolerance: Poor hypertension, On Medications (-) angina + CAD and + Peripheral Vascular Disease (carotid artery disease)  (-) Past MI and (-) Cardiac Stents Normal cardiovascular exam(-) dysrhythmias (-) Valvular Problems/Murmurs   Most recent myocardial perfusion imaging study was performed on 09/03/2019 revealing a normal left ventricular systolic function with an EF of 61%.  There were no regional wall motion abnormalities.  No artifact or left ventricular cavity size enlargement appreciated on review of imaging. SPECT images demonstrated no evidence of stress-induced myocardial ischemia or arrhythmia; no scintigraphic evidence of scar.  TID ratio = 0.91 (normal range </= 1.2). Study determined to be normal and low risk.    Most recent TTE performed on 03/21/2022 revealed a normal left ventricular systolic function with an EF of >55% %. There was mild LVH.  There were no regional wall motion abnormalities. Left ventricular diastolic Doppler parameters consistent with abnormal relaxation (G1DD).  Right ventricular size and function normal. There was trivial pan valvular regurgitation.  All transvalvular gradients were noted to be normal providing no evidence of hemodynamically significant valvular stenosis. Aorta normal in size with no evidence of ectasia or aneurysmal dilatation.    Neuro/Psych  Headaches, neg  Seizures PSYCHIATRIC DISORDERS Anxiety Depression     Neuromuscular disease    GI/Hepatic negative GI ROS, Neg liver ROS,,,  Endo/Other  diabetes, Type 2Hypothyroidism  Class 3 obesity  Renal/GU negative Renal ROS  negative genitourinary   Musculoskeletal  (+) Arthritis ,    Abdominal  (+) + obese  Peds  Hematology  (+) Blood dyscrasia, anemia   Anesthesia Other Findings PAT Note reviewed  Past Medical History: No date: Anemia No date: Anxiety     Comment:  a.) on BZO (clonazepam ) PRN No date: Aortic atherosclerosis (HCC) No date: Arthritis No date: B12 deficiency No date: Bilateral carotid artery disease (HCC) No date: Chronic back pain No date: Coronary artery calcification seen on CT scan No date: Depression 09/01/2019: Diastolic dysfunction     Comment:  a.) TTE 09/01/2019: >55%; mild LA enlargement; triv PR,               mild MR/TR; G1DD. No date: Dyspnea No date: High cholesterol No date: Hypertension No date: Hypothyroidism No date: Long term current use of antithrombotics/antiplatelets     Comment:  a.) clopidogrel  No date: Long term current use of immunosuppressive drug     Comment:  a.) on Ixekizumab (Taltz) for psoriatic athritis No date: Migraines 01/01/2021: Monoclonal gammopathy of unknown significance (MGUS)     Comment:  a.) IgG lambda MGUS No date: OSA (obstructive sleep apnea)     Comment:  a.) does not require nocturnal PAP therapy No date: Pneumonia No date: Psoriatic arthritis (HCC)     Comment:  a.) on Ixekizumab (Taltz) 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 No date: T2DM (type 2 diabetes mellitus) (HCC)  Past Surgical History: No date: ABDOMINAL HYSTERECTOMY No date: BACK SURGERY  Comment:  2 level fusion 08/23/2013: BREAST BIOPSY; Left     Comment:  stereo, negative No date: BUNIONECTOMY; Bilateral No date: carpal tunnel; Right No date: COLONOSCOPY WITH ESOPHAGOGASTRODUODENOSCOPY (EGD) No date: DILATION AND CURETTAGE OF  UTERUS 05/16/2021: KNEE ARTHROSCOPY; Left     Comment:  Procedure: ARTHROSCOPY KNEE;  Surgeon: Mardee Lynwood SQUIBB,               MD;  Location: [facility] ORS;  Service: Orthopedics;                Laterality: Left; 02/14/2022: SHOULDER ARTHROSCOPY WITH SUBACROMIAL DECOMPRESSION,  ROTATOR CUFF REPAIR AND BICEP TENDON REPAIR; Left     Comment:  Procedure: SHOULDER ARTHROSCOPY WITH DEBRIDEMENT,               DECOMPRESSION, ROTATOR CUFF REPAIR AND BICEPS TENODESIS.;              Surgeon: Edie Norleen PARAS, MD;  Location: [facility] ORS;                Service: Orthopedics;  Laterality: Left; No date: tendinitis; Right     Comment:  wrist No date: TRIGGER FINGER RELEASE; Right     Comment:  x 2 thumb and middle finger  BMI    Body Mass Index: 40.60 kg/m      Reproductive/Obstetrics negative OB ROS                              Anesthesia Physical Anesthesia Plan  ASA: 3  Anesthesia Plan: General   Post-op Pain Management: Ofirmev  IV (intra-op)*, Toradol  IV (intra-op)* and Ketamine IV*   Induction: Intravenous  PONV Risk Score and Plan: 2 and Propofol  infusion and TIVA  Airway Management Planned: Oral ETT  Additional Equipment:   Intra-op Plan:   Post-operative Plan: Extubation in OR  Informed Consent: I have reviewed the patients History and Physical, chart, labs and discussed the procedure including the risks, benefits and alternatives for the proposed anesthesia with the patient or authorized representative who has indicated his/her understanding and acceptance.     Dental Advisory Given  Plan Discussed with: Anesthesiologist, CRNA and Surgeon  Anesthesia Plan Comments:          Anesthesia Quick Evaluation

## 2024-02-11 ENCOUNTER — Encounter: Payer: Self-pay | Admitting: Neurosurgery

## 2024-02-16 ENCOUNTER — Telehealth: Payer: Self-pay | Admitting: Neurosurgery

## 2024-02-16 NOTE — Telephone Encounter (Signed)
 I notified the patient of Stacy's recommendation. She verbalized understanding.

## 2024-02-16 NOTE — Telephone Encounter (Signed)
 Patient reports experiencing lower back pain near the area of the known bulging disc. She is concerned the pain may be related to the disc issue. Describes pain as severe when walking, "feels like being hit with a baseball bat." Patient has completed prescribed pain medication and prednisone . She did not receive the recommended injection, and her next appointment is scheduled for 01/07.

## 2024-02-16 NOTE — Telephone Encounter (Signed)
 Looks like we have only evaluated her for her neck pain.   She is seeing PMR at [facility] for her lower back. I recommend she call them to for further recommendations.

## 2024-02-16 NOTE — Telephone Encounter (Signed)
 DOS: 02/09/24 C4-6 anterior cervical discectomy and fusion   Patient calls complaining of constant lower back pain started Wednesday afternoon (4/10). Has had a lot of back pain before. Pain is located all the way across her lower back, right below her fusion site. It extends down both legs, but not past her knees. Denies numbness and tingling. She describes her pain as severe and it "feels like being hit with a baseball bat." Nothing helps with pain at this time.   11/15 was supposed to get a steroid shot in her back for a herniated disc. Was not able to get it because her knee was so swollen from her knee surgery (had a concern for blood clot/vein issue). Earliest appointment for injection is now 03/17/24. Last injection in her back was in August, which did help.  She states that she quit taking pain meds and muscle relaxers 2 days after surgery, but started taking them again because of her return of back pain.    Current Regimen: Tylenol  - not taking Oxycodone  - 1 in the morning and one at night (not helping) Robaxin  - once in the morning and once at night (doesn't help) Ice/heat - does not help Prednisone  did not help (5 day script filled 02/20/24) Celebrex  - not taking Lyrica  - 200mg  BID

## 2024-02-16 NOTE — Telephone Encounter (Signed)
 Patient just had cervical surgery and her first post op is on 02/24/2024

## 2024-02-20 NOTE — Progress Notes (Unsigned)
° °  REFERRING PHYSICIAN:  Auston Reyes BIRCH, Md [address] [facility] [place],  [place] [address]  DOS: 02/09/24  ACDF C4-C6  HISTORY OF PRESENT ILLNESS: Taylor [name] is 2 weeks status post above surgery. She is on chronic ultram  and robaxin . Given oxycodone  to take for pain refractory to ultram  on discharge from the hospital.   She was also given medrol  dose pack due to previous dysphagia.  She has expected pain in her neck and shoulder blades. Still with numbness/tingling in  bilateral fingers but it is better than it was preop. She has noted some improvement in her bilateral arm pain as well.   She has intermittent swallowing issues- she has chronic dysphagia. She did take medrol  dose pack. Feels like this is improving.    PHYSICAL EXAMINATION:  NEUROLOGICAL:  General: In no acute distress.   Awake, alert, oriented to person, place, and time.  Pupils equal round and reactive to light.  Facial tone is symmetric.    Strength: Side Biceps Triceps Deltoid Interossei Grip Wrist Ext. Wrist Flex.  R 5 5 5 5 5 5 5   L 5 5 5 5 5 5 5    Incision c/d/i  Imaging:  Nothing new to review.   Assessment / Plan: Taylor [name] is doing well s/p above surgery. Treatment options reviewed with patient and following plan made:   - We discussed activity escalation and I have advised the patient to lift up to 10 pounds until 6 weeks after surgery (until follow up with Dr. Clois).   - Reviewed wound care.  - Continue current medications including her chronic ultram  and robaxin . She is out of oxycodone  and does not think she needs a refill.  - Restart PLAVIX  02/23/24.  - Follow up as scheduled in 4 weeks and prn.   Advised to contact the office if any questions or concerns arise.   Glade Boys PA-C Dept of Neurosurgery

## 2024-02-24 ENCOUNTER — Encounter: Payer: Self-pay | Admitting: Orthopedic Surgery

## 2024-02-24 ENCOUNTER — Ambulatory Visit: Admitting: Orthopedic Surgery

## 2024-02-24 VITALS — BP 112/66 | Temp 99.3°F | Ht 64.5 in | Wt 240.1 lb

## 2024-02-24 DIAGNOSIS — M4802 Spinal stenosis, cervical region: Secondary | ICD-10-CM

## 2024-02-24 DIAGNOSIS — Z981 Arthrodesis status: Secondary | ICD-10-CM

## 2024-03-15 ENCOUNTER — Other Ambulatory Visit: Payer: Self-pay

## 2024-03-15 DIAGNOSIS — M4802 Spinal stenosis, cervical region: Secondary | ICD-10-CM

## 2024-03-23 ENCOUNTER — Encounter: Payer: Self-pay | Admitting: Neurosurgery

## 2024-03-23 ENCOUNTER — Ambulatory Visit: Admitting: Neurosurgery

## 2024-03-23 ENCOUNTER — Ambulatory Visit

## 2024-03-23 VITALS — BP 118/76 | Ht 64.5 in | Wt 240.0 lb

## 2024-03-23 DIAGNOSIS — Z09 Encounter for follow-up examination after completed treatment for conditions other than malignant neoplasm: Secondary | ICD-10-CM

## 2024-03-23 DIAGNOSIS — M4802 Spinal stenosis, cervical region: Secondary | ICD-10-CM | POA: Diagnosis not present

## 2024-03-23 NOTE — Progress Notes (Signed)
" ° °  REFERRING PHYSICIAN:  Auston Reyes BIRCH, Md [address] [facility] [place],  [place] [address]  DOS: 02/09/24  ACDF C4-C6  HISTORY OF PRESENT ILLNESS: Taylor [name] is status post above surgery.   She is doing reasonably well.  She has some intrascapular pain.  Overall she is improved compared to before surgery     PHYSICAL EXAMINATION:  NEUROLOGICAL:  General: In no acute distress.   Awake, alert, oriented to person, place, and time.  Pupils equal round and reactive to light.  Facial tone is symmetric.    Strength: Side Biceps Triceps Deltoid Interossei Grip Wrist Ext. Wrist Flex.  R 5 5 5 5 5 5 5   L 5 5 5 5 5 5 5    Incision c/d/i  Imaging:  No complications noted  Assessment / Plan: Taylor [name] is doing well s/p above surgery.    I have given her exercises for her neck.  I suspect she will continue to improve.  Will see her back in 6 weeks.  We reviewed activity limitations.  Reeves Daisy MD Dept of Neurosurgery  "

## 2024-04-09 ENCOUNTER — Inpatient Hospital Stay: Payer: Medicare Other | Attending: Oncology

## 2024-04-09 ENCOUNTER — Encounter: Payer: Self-pay | Admitting: Oncology

## 2024-04-09 ENCOUNTER — Inpatient Hospital Stay: Payer: Medicare Other | Admitting: Oncology

## 2024-04-09 ENCOUNTER — Other Ambulatory Visit: Payer: Self-pay

## 2024-04-09 VITALS — BP 138/85 | HR 90 | Temp 97.7°F | Resp 19 | Ht 64.5 in | Wt 250.6 lb

## 2024-04-09 DIAGNOSIS — D472 Monoclonal gammopathy: Secondary | ICD-10-CM | POA: Diagnosis not present

## 2024-04-09 LAB — CMP (CANCER CENTER ONLY)
ALT: 35 U/L (ref 0–44)
AST: 35 U/L (ref 15–41)
Albumin: 4.5 g/dL (ref 3.5–5.0)
Alkaline Phosphatase: 109 U/L (ref 38–126)
Anion gap: 14 (ref 5–15)
BUN: 11 mg/dL (ref 8–23)
CO2: 25 mmol/L (ref 22–32)
Calcium: 10.1 mg/dL (ref 8.9–10.3)
Chloride: 98 mmol/L (ref 98–111)
Creatinine: 0.8 mg/dL (ref 0.44–1.00)
GFR, Estimated: >60 mL/min
Glucose, Bld: 329 mg/dL — ABNORMAL HIGH (ref 70–99)
Potassium: 4 mmol/L (ref 3.5–5.1)
Sodium: 137 mmol/L (ref 135–145)
Total Bilirubin: 0.3 mg/dL (ref 0.0–1.2)
Total Protein: 7.6 g/dL (ref 6.5–8.1)

## 2024-04-09 LAB — CBC WITH DIFFERENTIAL (CANCER CENTER ONLY)
Abs Immature Granulocytes: 0.1 10*3/uL — ABNORMAL HIGH (ref 0.00–0.07)
Basophils Absolute: 0 10*3/uL (ref 0.0–0.1)
Basophils Relative: 1 %
Eosinophils Absolute: 0.4 10*3/uL (ref 0.0–0.5)
Eosinophils Relative: 6 %
HCT: 38.7 % (ref 36.0–46.0)
Hemoglobin: 13.1 g/dL (ref 12.0–15.0)
Immature Granulocytes: 2 %
Lymphocytes Relative: 25 %
Lymphs Abs: 1.5 10*3/uL (ref 0.7–4.0)
MCH: 28.2 pg (ref 26.0–34.0)
MCHC: 33.9 g/dL (ref 30.0–36.0)
MCV: 83.4 fL (ref 80.0–100.0)
Monocytes Absolute: 0.4 10*3/uL (ref 0.1–1.0)
Monocytes Relative: 7 %
Neutro Abs: 3.5 10*3/uL (ref 1.7–7.7)
Neutrophils Relative %: 59 %
Platelet Count: 238 10*3/uL (ref 150–400)
RBC: 4.64 MIL/uL (ref 3.87–5.11)
RDW: 15.1 % (ref 11.5–15.5)
WBC Count: 5.9 10*3/uL (ref 4.0–10.5)
nRBC: 0 % (ref 0.0–0.2)

## 2024-04-09 NOTE — Progress Notes (Unsigned)
 Left total knee replacement on 12/10/23; Neck surgery on 02/09/24.  Difficulty chewing / swallowing (chronic); was told it's acid reflux. IBS / diarrhea since 64 years of age.  neuropathy to hands and feet.  Pain 5/10 from neck down; arthritis

## 2024-04-10 NOTE — Progress Notes (Signed)
 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  As a part of the work-up she had SPEP done which showed IgG lambda monoclonal protein of 0.3 g.  Patient has had mildly elevated calcium  in the past but most recent calcium  was 9.3.  H&H normal at 14.6/44.3.  Renal functions and total protein normal.   Results of blood work from 02/05/2021 were as follows: CBC showed normal white count hemoglobin and platelets with an MCV of 100.7.  CMP was normal.  Myeloma panel showed no M protein on SPEP but immunofixation showed IgG lambda monoclonal protein.  Serum free light chain ratio normal at 1.25  Interval history- Taylor [name] is a 64 year old female with monoclonal gammopathy of undetermined significance (MGUS) who presents for annual hematology follow-up to monitor for disease progression.  She was initially referred three years ago for evaluation of a monoclonal protein detected by immunofixation, with no detection on serum protein electrophoresis (SPEP). The abnormal protein was again detected by immunofixation last year, but as of January 2025, it was not detected on either SPEP or immunofixation. Her hemoglobin, calcium , and renal function have remained stable.  She denies unintentional weight loss, changes in appetite, or other constitutional symptoms. She feels well overall and  has not experienced any new health concerns.  During today's visit, her blood glucose was elevated at 329 mg/dL, which she attributes to having breakfast prior to the test. She states this is not a new issue for her.       ECOG PS- 0 Pain scale- 0  Review of systems- Review of Systems  Constitutional:  Negative for chills, fever, malaise/fatigue and weight loss.  HENT:  Negative for congestion, ear discharge and nosebleeds.   Eyes:  Negative for blurred vision.  Respiratory:  Negative for cough, hemoptysis, sputum production, shortness of breath and wheezing.   Cardiovascular:  Negative for chest pain, palpitations, orthopnea and claudication.  Gastrointestinal:  Negative for abdominal pain, blood in stool, constipation, diarrhea, heartburn, melena, nausea and vomiting.  Genitourinary:  Negative for dysuria, flank pain, frequency, hematuria and urgency.  Musculoskeletal:  Negative for back pain, joint pain and myalgias.  Skin:  Negative for rash.  Neurological:  Negative for dizziness, tingling, focal weakness, seizures, weakness and headaches.  Endo/Heme/Allergies:  Does not bruise/bleed easily.  Psychiatric/Behavioral:  Negative for depression and suicidal ideas. The patient does not have insomnia.       Allergies[1]   Past Medical History:  Diagnosis Date   Anxiety    a.) on BZO (clonazepam ) PRN   Aortic atherosclerosis    Arthritis    B12 deficiency    Bilateral carotid artery disease    Cervical stenosis of spine    Chronic back pain    COPD (chronic obstructive pulmonary disease) (HCC)    Coronary artery calcification seen on CT scan    DDD (degenerative disc disease), lumbar    a.)  s/p L4-5 and L5-S1 fusion   Depression    Diastolic dysfunction 09/01/2019   a.) TTE 09/01/2019: >55%; mild LA enlargement; triv PR, mild MR/TR; G1DD.   Dyspnea    Esophageal spasm    GERD (gastroesophageal reflux disease)    High cholesterol    History of methicillin resistant  staphylococcus aureus (MRSA) 2005   Hypertension    Hypothyroidism    IDA (iron deficiency anemia)    Long term current use of clopidogrel     Long term current use of immunosuppressive drug    a.) on ustekinumab (Stelara) for psoriatic athritis   Migraines    Monoclonal gammopathy of unknown significance (MGUS) 01/01/2021   a.) IgG lambda MGUS   Morbid obesity (HCC)    OSA (obstructive sleep apnea)    Has not picked up cpap yet as of 01-26-24 due to cost   Pneumonia    Psoriasis    Psoriatic arthritis (HCC)    a.) will not start  ustekinumab (Stelara) until after cervical spine surgery   PSVT (paroxysmal supraventricular tachycardia)    Radiculopathy of cervical spine    Right carpal tunnel syndrome s/p release    Sensory polyneuropathy    T2DM (type 2 diabetes mellitus) (HCC)    Thyroid  nodule      Past Surgical History:  Procedure Laterality Date   ABDOMINAL HYSTERECTOMY     ANTERIOR CERVICAL DECOMP/DISCECTOMY FUSION N/A 02/09/2024   Procedure: ANTERIOR CERVICAL DECOMPRESSION/DISCECTOMY FUSION 2 LEVELS;  Surgeon: Clois Fret, MD;  Location: [facility] ORS;  Service: Neurosurgery;  Laterality: N/A;   BACK SURGERY     2 level fusion   BREAST BIOPSY Left 08/23/2013   stereo, negative   BUNIONECTOMY Bilateral    carpal tunnel Right    CESAREAN SECTION     x1   COLONOSCOPY N/A 11/06/2023   Procedure: COLONOSCOPY;  Surgeon: Onita Elspeth Sharper, DO;  Location: [facility] ENDOSCOPY;  Service: Endoscopy;  Laterality: N/A;   COLONOSCOPY WITH ESOPHAGOGASTRODUODENOSCOPY (EGD)     CYST EXCISION     lower back   DILATION AND CURETTAGE OF UTERUS     ESOPHAGOGASTRODUODENOSCOPY N/A 11/06/2023   Procedure: EGD (ESOPHAGOGASTRODUODENOSCOPY);  Surgeon: Onita Elspeth Sharper, DO;  Location: [facility] ENDOSCOPY;  Service: Endoscopy;  Laterality: N/A;   KNEE ARTHROPLASTY Left 12/10/2023   Procedure: ARTHROPLASTY, KNEE, TOTAL, USING IMAGELESS COMPUTER-ASSISTED NAVIGATION;  Surgeon: Mardee Lynwood SQUIBB,  MD;  Location: [facility] ORS;  Service: Orthopedics;  Laterality: Left;   KNEE ARTHROSCOPY Left 05/16/2021   Procedure: ARTHROSCOPY KNEE;  Surgeon: Mardee Lynwood SQUIBB, MD;  Location: [facility] ORS;  Service: Orthopedics;  Laterality: Left;   MALONEY DILATION  11/06/2023   Procedure: DILATION, ESOPHAGUS, USING MALONEY DILATOR;  Surgeon: Onita Elspeth Sharper, DO;  Location: [facility] ENDOSCOPY;  Service: Endoscopy;;   POLYPECTOMY  11/06/2023   Procedure: POLYPECTOMY, INTESTINE;  Surgeon: Onita Elspeth Sharper, DO;  Location: [facility] ENDOSCOPY;  Service: Endoscopy;;   SHOULDER ARTHROSCOPY WITH SUBACROMIAL DECOMPRESSION, ROTATOR CUFF REPAIR AND BICEP TENDON REPAIR Left 02/14/2022   Procedure: SHOULDER ARTHROSCOPY WITH DEBRIDEMENT, DECOMPRESSION, ROTATOR CUFF REPAIR AND BICEPS TENODESIS.;  Surgeon: Edie Norleen PARAS, MD;  Location: [facility] ORS;  Service: Orthopedics;  Laterality: Left;   tendinitis Right    wrist   TONSILLECTOMY     TRIGGER FINGER RELEASE Right    x 2 thumb and middle finger    Social History   Socioeconomic History   Marital status: Married    Spouse name: Not on file   Number of children: Not on  file   Years of education: Not on file   Highest education level: Not on file  Occupational History   Not on file  Tobacco Use   Smoking status: Former    Current packs/day: 0.00    Types: Cigarettes    Quit date: 03/11/2016    Years since quitting: 8.0   Smokeless tobacco: Never  Vaping Use   Vaping status: Former  Substance and Sexual Activity   Alcohol use: Not Currently    Comment: last drink in 2023   Drug use: No   Sexual activity: Not Currently  Other Topics Concern   Not on file  Social History Narrative   Lives at home with husband   Social Drivers of Health   Tobacco Use: Medium Risk (04/09/2024)   Patient History    Smoking Tobacco Use: Former    Smokeless Tobacco Use: Never    Passive Exposure: Not on file  Financial Resource Strain: Medium Risk (03/16/2024)   Received from [facility]   Overall Financial Resource Strain (CARDIA)    Difficulty of Paying Living Expenses: Somewhat hard  Food Insecurity: Food Insecurity Present (03/16/2024)   Received from [facility]   Epic    Within the past 12 months, you worried that your food would run out before you got the money to buy more.: Never true    Within the past 12 months, the food you bought just didn't last and you didn't have money to get more.: Sometimes true  Transportation Needs: No Transportation Needs (03/16/2024)   Received from [facility] - Transportation    In the past 12 months, has lack of transportation kept you from medical appointments or from getting medications?: No    Lack of Transportation (Non-Medical): No  Physical Activity: Not on file  Stress: Not on file  Social Connections: Not on file  Intimate Partner Violence: Not At Risk (12/10/2023)   Epic    Fear of Current or Ex-Partner: No    Emotionally Abused: No    Physically Abused: No    Sexually Abused: No  Depression (PHQ2-9): Low Risk (04/09/2024)   Depression (PHQ2-9)    PHQ-2 Score: 0  Alcohol Screen: Not on file  Housing: High Risk (04/09/2024)   Received from [facility]   Epic    In the last 12 months, was there a time when you were not able to pay the mortgage or rent on time?: Yes    In the past 12 months, how many times have you moved where you were living?: 0    At any time in the past 12 months, were you homeless or living in a shelter (including now)?: No  Utilities: Not At Risk (03/16/2024)   Received from [facility]   Epic    In the past 12 months has the electric, gas, oil, or water company threatened to shut off services in your home?: No  Health Literacy: Not on file    Family History  Problem Relation Age of Onset   Breast cancer Mother 54    Current Medications[2]  Physical exam:  Vitals:   04/09/24 1415 04/09/24  1427  BP: (!) 146/94 138/85  Pulse: 93 90  Resp: 19   Temp: 97.7 F (36.5 C)   TempSrc: Tympanic   SpO2: 97%   Weight: 250 lb 9.6 oz (113.7 kg)   Height: 5' 4.5 (1.638 m)    Physical  Exam Cardiovascular:     Rate and Rhythm: Normal rate and regular rhythm.     Heart sounds: Normal heart sounds.  Pulmonary:     Effort: Pulmonary effort is normal.     Breath sounds: Normal breath sounds.  Skin:    General: Skin is warm and dry.  Neurological:     Mental Status: She is alert and oriented to person, place, and time.      I have personally reviewed labs listed below:    Latest Ref Rng & Units 04/09/2024    1:57 PM  CMP  Glucose 70 - 99 mg/dL 670   BUN 8 - 23 mg/dL 11   Creatinine 9.55 - 1.00 mg/dL 9.19   Sodium 864 - 854 mmol/L 137   Potassium 3.5 - 5.1 mmol/L 4.0   Chloride 98 - 111 mmol/L 98   CO2 22 - 32 mmol/L 25   Calcium  8.9 - 10.3 mg/dL 89.8   Total Protein 6.5 - 8.1 g/dL 7.6   Total Bilirubin 0.0 - 1.2 mg/dL 0.3   Alkaline Phos 38 - 126 U/L 109   AST 15 - 41 U/L 35   ALT 0 - 44 U/L 35       Latest Ref Rng & Units 04/09/2024    1:57 PM  CBC  WBC 4.0 - 10.5 K/uL 5.9   Hemoglobin 12.0 - 15.0 g/dL 86.8   Hematocrit 63.9 - 46.0 % 38.7   Platelets 150 - 400 K/uL 238    I have personally reviewed Radiology images listed below: No images are attached to the encounter.  DG Cervical Spine 2 or 3 views Result Date: 03/29/2024 EXAM: 2 or 3 VIEW(S) XRAY OF THE CERVICAL SPINE 03/23/2024 09:54:24 AM COMPARISON: Intraoperative spot fluoroscopic views 02/09/2024, preoperative MRI 02/03/2023. CLINICAL HISTORY: fusion fusion FINDINGS: BONES: Vertebral body heights are maintained. Alignment is normal. There is C4 through C6 fusion plating with interbody metallic disc space hardware at C4-C5 and C5-C6. There is a 3.5 mm gap between the anterior plate and the anterior vertebral body of C4, but this was present on the intraoperative films and is not new. No screw loosening is  suspected. DISCS AND DEGENERATIVE CHANGES: There is slight disc narrowing of C3-C4, with normal disc heights C2-C3 and C7-T1. Disc space loss again at C6-C7. There is moderate multilevel facet hypertrophy with uncinate spurs. SOFT TISSUES: No prevertebral soft tissue swelling. The visualized lungs appear clear. IMPRESSION: 1. C4 through C6 fusion plating with interbody metallic disc space hardware at C4-5 and C5-6, without suspected screw loosening, and with unchanged 3.5 mm gap between the anterior plate and the anterior vertebral body of C4 compared to intraoperative films. Electronically signed by: Francis Quam MD 03/29/2024 07:56 AM EST RP Workstation: [id number]     Assessment and plan- Patient is a 64 y.o. female here for routine f/u of IgG lambda MGUS  Assessment and Plan    Monoclonal gammopathy of undetermined significance Indolent MGUS with no detectable abnormal protein on SPEP as of January 2025; abnormal protein detected on immunofixation. Asymptomatic with stable hemoglobin, calcium , and renal function. No progression to multiple myeloma. - Reviewed recent labs; abnormal protein detected on immunofixation, not on SPEP. - Pending tests will be reviewed; contact if abnormalities found. - Annual hematology follow-up with labs in one year recommended.         Visit Diagnosis 1. MGUS (monoclonal gammopathy of unknown significance)      Dr. Annah Skene, MD, MPH [facility] [phone] 04/10/2024 11:48 AM                   [1]  Allergies Allergen Reactions   Aspirin  Hives and Swelling   Penicillins Hives    IgE = 168 (WNL) on 12/03/2023  [2]  Current Outpatient Medications:    ADVAIR HFA 115-21 MCG/ACT inhaler, Inhale 2 puffs into the lungs 2 (two) times daily., Disp: , Rfl:    albuterol  (VENTOLIN  HFA) 108 (90 Base) MCG/ACT inhaler, Inhale 1-2 puffs into the lungs every 6 (six) hours as needed for wheezing or shortness of breath.,  Disp: , Rfl:    bisoprolol -hydrochlorothiazide  (ZIAC ) 10-6.25 MG tablet, Take 1 tablet by mouth at bedtime., Disp: , Rfl:    buPROPion  (WELLBUTRIN  XL) 150 MG 24 hr tablet, Take 150 mg by mouth at bedtime., Disp: , Rfl:    clonazePAM  (KLONOPIN ) 0.5 MG tablet, Take 0.5 mg by mouth 2 (two) times daily as needed for anxiety., Disp: , Rfl:    clopidogrel  (PLAVIX ) 75 MG tablet, Take 75 mg by mouth daily., Disp: , Rfl:    cyanocobalamin (VITAMIN B12) 1000 MCG/ML injection, Inject 1,000 mcg into the muscle every 30 (thirty) days., Disp: , Rfl:    escitalopram  (LEXAPRO ) 20 MG tablet, Take 20 mg by mouth at bedtime., Disp: , Rfl:    folic acid (FOLVITE) 1 MG tablet, Take 1 mg by mouth., Disp: , Rfl:    glimepiride  (AMARYL ) 4 MG tablet, Take 4 mg by mouth in the morning and at bedtime., Disp: , Rfl:    hydrOXYzine (ATARAX) 25 MG tablet, Take 25 mg by mouth., Disp: , Rfl:    levothyroxine  (SYNTHROID ) 50 MCG tablet, Take 75 mcg by mouth daily before breakfast., Disp: , Rfl:    methocarbamol  (ROBAXIN ) 500 MG tablet, Take 1 tablet (500 mg total) by mouth every 6 (six) hours as needed for muscle spasms., Disp: 120 tablet, Rfl: 0   methotrexate (RHEUMATREX) 2.5 MG tablet, Take 15 mg by mouth., Disp: , Rfl:    nortriptyline  (PAMELOR ) 25 MG capsule, Take 25 mg by mouth at bedtime., Disp: , Rfl:    polyethylene glycol powder (MIRALAX ) 17 GM/SCOOP powder, Take 17 g by mouth daily as needed for moderate constipation. Dissolve 1 capful (17g) in 4-8 ounces of liquid and take by mouth daily., Disp: 238 g, Rfl: 0   pregabalin  (LYRICA ) 200 MG capsule, Take 200 mg by mouth 2 (two) times daily., Disp: , Rfl:    rosuvastatin  (CRESTOR ) 40 MG tablet, Take 40 mg by mouth daily., Disp: , Rfl:    senna (SENOKOT) 8.6 MG TABS tablet, Take 1 tablet (8.6 mg total) by mouth 2 (two) times daily as needed., Disp: 30 tablet, Rfl: 0   STELARA 90 MG/ML SOSY injection, Inject 90 mg into the skin every 8 (eight) weeks. (Patient taking  differently: Inject 90 mg into the skin every 8 (eight) weeks. Has only taken one shot; medication is on hold and is waiting to get it), Disp: , Rfl:    torsemide  (DEMADEX ) 20 MG tablet, Take 20 mg by mouth daily as needed (fluid)., Disp: , Rfl:    traMADol  (ULTRAM ) 50 MG tablet, Take 2 tablets (100 mg total) by mouth 2 (two) times daily as needed for moderate pain (pain score 4-6)., Disp: 30 tablet, Rfl: 0   vitamin B-12 (CYANOCOBALAMIN) 1000 MCG tablet, Take 1,000 mcg by mouth daily., Disp: , Rfl:    Vitamin D, Ergocalciferol, (DRISDOL) 1.25 MG (50000 UNIT) CAPS  capsule, Take 50,000 Units by mouth every Sunday., Disp: , Rfl:   "

## 2024-04-12 LAB — KAPPA/LAMBDA LIGHT CHAINS
Kappa free light chain: 14.3 mg/L (ref 3.3–19.4)
Kappa, lambda light chain ratio: 0.82 (ref 0.26–1.65)
Lambda free light chains: 17.4 mg/L (ref 5.7–26.3)

## 2024-04-13 LAB — MULTIPLE MYELOMA PANEL, SERUM
Albumin SerPl Elph-Mcnc: 3.7 g/dL (ref 2.9–4.4)
Albumin/Glob SerPl: 1.2 (ref 0.7–1.7)
Alpha 1: 0.2 g/dL (ref 0.0–0.4)
Alpha2 Glob SerPl Elph-Mcnc: 0.8 g/dL (ref 0.4–1.0)
B-Globulin SerPl Elph-Mcnc: 1.3 g/dL (ref 0.7–1.3)
Gamma Glob SerPl Elph-Mcnc: 1 g/dL (ref 0.4–1.8)
Globulin, Total: 3.3 g/dL (ref 2.2–3.9)
IgA: 183 mg/dL (ref 87–352)
IgG (Immunoglobin G), Serum: 1016 mg/dL (ref 586–1602)
IgM (Immunoglobulin M), Srm: 102 mg/dL (ref 26–217)
M Protein SerPl Elph-Mcnc: 0.2 g/dL — ABNORMAL HIGH
Total Protein ELP: 7 g/dL (ref 6.0–8.5)

## 2024-04-29 ENCOUNTER — Encounter: Admitting: Orthopedic Surgery

## 2024-04-29 ENCOUNTER — Other Ambulatory Visit

## 2024-05-05 ENCOUNTER — Ambulatory Visit (INDEPENDENT_AMBULATORY_CARE_PROVIDER_SITE_OTHER): Admitting: Nurse Practitioner

## 2025-04-08 ENCOUNTER — Inpatient Hospital Stay: Admitting: Oncology

## 2025-04-08 ENCOUNTER — Inpatient Hospital Stay
